# Patient Record
Sex: Female | Born: 1935 | Race: Black or African American | Hispanic: No | Smoking: Former smoker
Health system: Southern US, Community
[De-identification: ages and names within clinical notes are randomized; demographics above are authoritative.]

## PROBLEM LIST (undated history)

## (undated) DIAGNOSIS — A419 Sepsis, unspecified organism: Secondary | ICD-10-CM

## (undated) DIAGNOSIS — R609 Edema, unspecified: Secondary | ICD-10-CM

## (undated) DIAGNOSIS — Z634 Disappearance and death of family member: Secondary | ICD-10-CM

## (undated) DIAGNOSIS — I10 Essential (primary) hypertension: Secondary | ICD-10-CM

## (undated) DIAGNOSIS — N181 Chronic kidney disease, stage 1: Secondary | ICD-10-CM

## (undated) DIAGNOSIS — R1310 Dysphagia, unspecified: Secondary | ICD-10-CM

## (undated) DIAGNOSIS — B0052 Herpesviral keratitis: Secondary | ICD-10-CM

## (undated) DIAGNOSIS — K5730 Diverticulosis of large intestine without perforation or abscess without bleeding: Secondary | ICD-10-CM

## (undated) DIAGNOSIS — F0390 Unspecified dementia without behavioral disturbance: Secondary | ICD-10-CM

## (undated) DIAGNOSIS — E875 Hyperkalemia: Secondary | ICD-10-CM

## (undated) DIAGNOSIS — M810 Age-related osteoporosis without current pathological fracture: Secondary | ICD-10-CM

## (undated) DIAGNOSIS — K5900 Constipation, unspecified: Secondary | ICD-10-CM

## (undated) DIAGNOSIS — E876 Hypokalemia: Secondary | ICD-10-CM

## (undated) DIAGNOSIS — R739 Hyperglycemia, unspecified: Secondary | ICD-10-CM

## (undated) DIAGNOSIS — K219 Gastro-esophageal reflux disease without esophagitis: Secondary | ICD-10-CM

## (undated) DIAGNOSIS — K769 Liver disease, unspecified: Secondary | ICD-10-CM

## (undated) DIAGNOSIS — M25519 Pain in unspecified shoulder: Secondary | ICD-10-CM

## (undated) DIAGNOSIS — D649 Anemia, unspecified: Secondary | ICD-10-CM

## (undated) DIAGNOSIS — I4891 Unspecified atrial fibrillation: Secondary | ICD-10-CM

## (undated) DIAGNOSIS — M1990 Unspecified osteoarthritis, unspecified site: Secondary | ICD-10-CM

## (undated) DIAGNOSIS — E785 Hyperlipidemia, unspecified: Secondary | ICD-10-CM

## (undated) DIAGNOSIS — D509 Iron deficiency anemia, unspecified: Secondary | ICD-10-CM

## (undated) DIAGNOSIS — M75120 Complete rotator cuff tear or rupture of unspecified shoulder, not specified as traumatic: Secondary | ICD-10-CM

## (undated) HISTORY — DX: Diverticulosis of large intestine without perforation or abscess without bleeding: K57.30

## (undated) HISTORY — DX: Age-related osteoporosis without current pathological fracture: M81.0

## (undated) HISTORY — DX: Essential (primary) hypertension: I10

## (undated) HISTORY — DX: Gastro-esophageal reflux disease without esophagitis: K21.9

## (undated) HISTORY — DX: Unspecified osteoarthritis, unspecified site: M19.90

## (undated) HISTORY — DX: Disappearance and death of family member: Z63.4

## (undated) HISTORY — DX: Pain in unspecified shoulder: M25.519

## (undated) HISTORY — DX: Edema, unspecified: R60.9

## (undated) HISTORY — PX: ABDOMINAL HYSTERECTOMY: SHX81

## (undated) HISTORY — PX: EYE SURGERY: SHX253

## (undated) HISTORY — DX: Iron deficiency anemia, unspecified: D50.9

## (undated) HISTORY — DX: Constipation, unspecified: K59.00

## (undated) HISTORY — DX: Chronic kidney disease, stage 1: N18.1

## (undated) HISTORY — DX: Hyperlipidemia, unspecified: E78.5

## (undated) HISTORY — PX: OTHER SURGICAL HISTORY: SHX169

## (undated) HISTORY — DX: Complete rotator cuff tear or rupture of unspecified shoulder, not specified as traumatic: M75.120

## (undated) HISTORY — DX: Herpesviral keratitis: B00.52

## (undated) HISTORY — DX: Hyperglycemia, unspecified: R73.9

---

## 2001-03-21 ENCOUNTER — Ambulatory Visit (HOSPITAL_COMMUNITY): Admission: RE | Admit: 2001-03-21 | Discharge: 2001-03-21 | Payer: Self-pay | Admitting: General Surgery

## 2001-03-21 ENCOUNTER — Encounter: Payer: Self-pay | Admitting: General Surgery

## 2001-03-24 ENCOUNTER — Ambulatory Visit (HOSPITAL_COMMUNITY): Admission: RE | Admit: 2001-03-24 | Discharge: 2001-03-24 | Payer: Self-pay | Admitting: General Surgery

## 2001-03-24 ENCOUNTER — Encounter: Payer: Self-pay | Admitting: General Surgery

## 2001-04-01 ENCOUNTER — Ambulatory Visit (HOSPITAL_COMMUNITY): Admission: RE | Admit: 2001-04-01 | Discharge: 2001-04-01 | Payer: Self-pay | Admitting: General Surgery

## 2001-04-19 ENCOUNTER — Ambulatory Visit (HOSPITAL_COMMUNITY): Admission: RE | Admit: 2001-04-19 | Discharge: 2001-04-20 | Payer: Self-pay | Admitting: Family Medicine

## 2001-04-20 ENCOUNTER — Encounter: Payer: Self-pay | Admitting: Family Medicine

## 2001-05-11 ENCOUNTER — Ambulatory Visit (HOSPITAL_COMMUNITY): Admission: RE | Admit: 2001-05-11 | Discharge: 2001-05-11 | Payer: Self-pay | Admitting: General Surgery

## 2001-08-03 ENCOUNTER — Ambulatory Visit (HOSPITAL_COMMUNITY): Admission: RE | Admit: 2001-08-03 | Discharge: 2001-08-03 | Payer: Self-pay | Admitting: General Surgery

## 2001-08-03 ENCOUNTER — Encounter: Payer: Self-pay | Admitting: General Surgery

## 2002-08-09 ENCOUNTER — Ambulatory Visit (HOSPITAL_COMMUNITY): Admission: RE | Admit: 2002-08-09 | Discharge: 2002-08-09 | Payer: Self-pay | Admitting: General Surgery

## 2002-08-09 ENCOUNTER — Encounter: Payer: Self-pay | Admitting: General Surgery

## 2003-08-13 ENCOUNTER — Ambulatory Visit (HOSPITAL_COMMUNITY): Admission: RE | Admit: 2003-08-13 | Discharge: 2003-08-13 | Payer: Self-pay | Admitting: Family Medicine

## 2004-05-07 ENCOUNTER — Ambulatory Visit (HOSPITAL_COMMUNITY): Admission: RE | Admit: 2004-05-07 | Discharge: 2004-05-07 | Payer: Self-pay | Admitting: Family Medicine

## 2004-05-26 ENCOUNTER — Ambulatory Visit (HOSPITAL_COMMUNITY): Admission: RE | Admit: 2004-05-26 | Discharge: 2004-05-26 | Payer: Self-pay | Admitting: Family Medicine

## 2004-05-29 ENCOUNTER — Ambulatory Visit: Payer: Self-pay | Admitting: *Deleted

## 2004-09-15 ENCOUNTER — Ambulatory Visit (HOSPITAL_COMMUNITY): Admission: RE | Admit: 2004-09-15 | Discharge: 2004-09-15 | Payer: Self-pay | Admitting: General Surgery

## 2004-11-27 ENCOUNTER — Ambulatory Visit (HOSPITAL_COMMUNITY): Admission: RE | Admit: 2004-11-27 | Discharge: 2004-11-27 | Payer: Self-pay | Admitting: General Surgery

## 2005-09-16 ENCOUNTER — Ambulatory Visit (HOSPITAL_COMMUNITY): Admission: RE | Admit: 2005-09-16 | Discharge: 2005-09-16 | Payer: Self-pay | Admitting: General Surgery

## 2006-04-16 ENCOUNTER — Encounter: Admission: RE | Admit: 2006-04-16 | Discharge: 2006-04-16 | Payer: Self-pay | Admitting: Orthopedic Surgery

## 2006-04-16 ENCOUNTER — Encounter (INDEPENDENT_AMBULATORY_CARE_PROVIDER_SITE_OTHER): Payer: Self-pay | Admitting: Family Medicine

## 2006-05-31 ENCOUNTER — Ambulatory Visit: Payer: Self-pay | Admitting: Family Medicine

## 2006-06-02 ENCOUNTER — Encounter (INDEPENDENT_AMBULATORY_CARE_PROVIDER_SITE_OTHER): Payer: Self-pay | Admitting: Family Medicine

## 2006-06-04 ENCOUNTER — Ambulatory Visit (HOSPITAL_COMMUNITY): Admission: RE | Admit: 2006-06-04 | Discharge: 2006-06-04 | Payer: Self-pay | Admitting: Family Medicine

## 2006-06-04 ENCOUNTER — Encounter (INDEPENDENT_AMBULATORY_CARE_PROVIDER_SITE_OTHER): Payer: Self-pay | Admitting: Family Medicine

## 2006-06-14 ENCOUNTER — Ambulatory Visit: Payer: Self-pay | Admitting: Family Medicine

## 2006-06-15 ENCOUNTER — Encounter (INDEPENDENT_AMBULATORY_CARE_PROVIDER_SITE_OTHER): Payer: Self-pay | Admitting: Family Medicine

## 2006-06-17 ENCOUNTER — Encounter (INDEPENDENT_AMBULATORY_CARE_PROVIDER_SITE_OTHER): Payer: Self-pay | Admitting: Family Medicine

## 2006-07-15 ENCOUNTER — Ambulatory Visit: Payer: Self-pay | Admitting: Family Medicine

## 2006-07-19 ENCOUNTER — Encounter: Payer: Self-pay | Admitting: Family Medicine

## 2006-07-19 DIAGNOSIS — M1990 Unspecified osteoarthritis, unspecified site: Secondary | ICD-10-CM | POA: Insufficient documentation

## 2006-07-19 DIAGNOSIS — K5900 Constipation, unspecified: Secondary | ICD-10-CM | POA: Insufficient documentation

## 2006-07-19 DIAGNOSIS — M810 Age-related osteoporosis without current pathological fracture: Secondary | ICD-10-CM | POA: Insufficient documentation

## 2006-07-19 DIAGNOSIS — K5730 Diverticulosis of large intestine without perforation or abscess without bleeding: Secondary | ICD-10-CM | POA: Insufficient documentation

## 2006-07-19 DIAGNOSIS — E785 Hyperlipidemia, unspecified: Secondary | ICD-10-CM | POA: Insufficient documentation

## 2006-07-24 ENCOUNTER — Encounter (INDEPENDENT_AMBULATORY_CARE_PROVIDER_SITE_OTHER): Payer: Self-pay | Admitting: Family Medicine

## 2006-08-26 ENCOUNTER — Ambulatory Visit: Payer: Self-pay | Admitting: Family Medicine

## 2006-08-26 LAB — CONVERTED CEMR LAB
Cholesterol, target level: 200 mg/dL
HDL goal, serum: 40 mg/dL

## 2006-08-28 LAB — CONVERTED CEMR LAB
ALT: 8 units/L (ref 0–35)
AST: 10 units/L (ref 0–37)
Albumin: 4.1 g/dL (ref 3.5–5.2)
Alkaline Phosphatase: 64 units/L (ref 39–117)
Bilirubin, Direct: 0.1 mg/dL (ref 0.0–0.3)
Indirect Bilirubin: 0.3 mg/dL (ref 0.0–0.9)
Total Bilirubin: 0.4 mg/dL (ref 0.3–1.2)
Total Protein: 6.7 g/dL (ref 6.0–8.3)

## 2006-09-20 ENCOUNTER — Ambulatory Visit (HOSPITAL_COMMUNITY): Admission: RE | Admit: 2006-09-20 | Discharge: 2006-09-20 | Payer: Self-pay | Admitting: Family Medicine

## 2006-10-07 ENCOUNTER — Ambulatory Visit: Payer: Self-pay | Admitting: Family Medicine

## 2006-10-11 ENCOUNTER — Encounter (INDEPENDENT_AMBULATORY_CARE_PROVIDER_SITE_OTHER): Payer: Self-pay | Admitting: Family Medicine

## 2006-10-20 ENCOUNTER — Ambulatory Visit: Payer: Self-pay | Admitting: Orthopedic Surgery

## 2006-10-21 LAB — CONVERTED CEMR LAB
ALT: <8 units/L (ref 0–35)
AST: 11 units/L (ref 0–37)
Albumin: 4.3 g/dL (ref 3.5–5.2)
BUN: 23 mg/dL (ref 6–23)
CO2: 24 meq/L (ref 19–32)
Calcium: 9.5 mg/dL (ref 8.4–10.5)
Chloride: 107 meq/L (ref 96–112)
Sodium: 143 meq/L (ref 135–145)
Total CHOL/HDL Ratio: 3.9

## 2006-11-09 ENCOUNTER — Telehealth (INDEPENDENT_AMBULATORY_CARE_PROVIDER_SITE_OTHER): Payer: Self-pay | Admitting: *Deleted

## 2006-11-23 ENCOUNTER — Encounter (INDEPENDENT_AMBULATORY_CARE_PROVIDER_SITE_OTHER): Payer: Self-pay | Admitting: Family Medicine

## 2006-12-02 ENCOUNTER — Telehealth (INDEPENDENT_AMBULATORY_CARE_PROVIDER_SITE_OTHER): Payer: Self-pay | Admitting: *Deleted

## 2006-12-06 ENCOUNTER — Ambulatory Visit: Payer: Self-pay | Admitting: Family Medicine

## 2006-12-31 ENCOUNTER — Ambulatory Visit: Payer: Self-pay | Admitting: Family Medicine

## 2007-01-01 ENCOUNTER — Encounter (INDEPENDENT_AMBULATORY_CARE_PROVIDER_SITE_OTHER): Payer: Self-pay | Admitting: Family Medicine

## 2007-01-02 LAB — CONVERTED CEMR LAB
ALT: <8 units/L (ref 0–35)
AST: 12 units/L (ref 0–37)
Albumin: 4.1 g/dL (ref 3.5–5.2)
Alkaline Phosphatase: 59 units/L (ref 39–117)
Basophils Absolute: 0 10*3/uL (ref 0.0–0.1)
Chloride: 108 meq/L (ref 96–112)
Eosinophils Absolute: 0.1 10*3/uL (ref 0.0–0.7)
Eosinophils Relative: 1 % (ref 0–5)
Glucose, Bld: 89 mg/dL (ref 70–99)
Hemoglobin: 10.2 g/dL — ABNORMAL LOW (ref 12.0–15.0)
Lymphs Abs: 0.8 10*3/uL (ref 0.7–3.3)
MCHC: 30.4 g/dL (ref 30.0–36.0)
Monocytes Absolute: 0.2 10*3/uL (ref 0.2–0.7)
Neutro Abs: 4.7 10*3/uL (ref 1.7–7.7)
RDW: 15.2 % — ABNORMAL HIGH (ref 11.5–14.0)
Total Protein: 6.8 g/dL (ref 6.0–8.3)

## 2007-01-03 ENCOUNTER — Encounter (INDEPENDENT_AMBULATORY_CARE_PROVIDER_SITE_OTHER): Payer: Self-pay | Admitting: Family Medicine

## 2007-01-03 ENCOUNTER — Telehealth (INDEPENDENT_AMBULATORY_CARE_PROVIDER_SITE_OTHER): Payer: Self-pay | Admitting: *Deleted

## 2007-01-04 ENCOUNTER — Encounter (INDEPENDENT_AMBULATORY_CARE_PROVIDER_SITE_OTHER): Payer: Self-pay | Admitting: Family Medicine

## 2007-01-11 ENCOUNTER — Encounter (INDEPENDENT_AMBULATORY_CARE_PROVIDER_SITE_OTHER): Payer: Self-pay | Admitting: Family Medicine

## 2007-01-13 ENCOUNTER — Telehealth (INDEPENDENT_AMBULATORY_CARE_PROVIDER_SITE_OTHER): Payer: Self-pay | Admitting: *Deleted

## 2007-01-13 LAB — CONVERTED CEMR LAB
TIBC: 254 ug/dL (ref 250–470)
Vitamin B-12: 501 pg/mL (ref 211–911)

## 2007-01-20 ENCOUNTER — Ambulatory Visit: Payer: Self-pay | Admitting: Family Medicine

## 2007-01-20 ENCOUNTER — Telehealth (INDEPENDENT_AMBULATORY_CARE_PROVIDER_SITE_OTHER): Payer: Self-pay | Admitting: *Deleted

## 2007-01-20 DIAGNOSIS — N182 Chronic kidney disease, stage 2 (mild): Secondary | ICD-10-CM | POA: Insufficient documentation

## 2007-02-01 ENCOUNTER — Ambulatory Visit: Payer: Self-pay | Admitting: Internal Medicine

## 2007-02-07 ENCOUNTER — Ambulatory Visit: Payer: Self-pay | Admitting: Internal Medicine

## 2007-03-02 ENCOUNTER — Ambulatory Visit: Payer: Self-pay | Admitting: Family Medicine

## 2007-03-03 ENCOUNTER — Encounter (INDEPENDENT_AMBULATORY_CARE_PROVIDER_SITE_OTHER): Payer: Self-pay | Admitting: Family Medicine

## 2007-04-01 ENCOUNTER — Ambulatory Visit: Payer: Self-pay | Admitting: Internal Medicine

## 2007-04-08 ENCOUNTER — Ambulatory Visit: Payer: Self-pay | Admitting: Family Medicine

## 2007-04-13 ENCOUNTER — Ambulatory Visit (HOSPITAL_COMMUNITY): Admission: RE | Admit: 2007-04-13 | Discharge: 2007-04-13 | Payer: Self-pay | Admitting: Internal Medicine

## 2007-04-13 ENCOUNTER — Ambulatory Visit: Payer: Self-pay | Admitting: Internal Medicine

## 2007-04-13 LAB — HM COLONOSCOPY

## 2007-04-29 ENCOUNTER — Encounter (INDEPENDENT_AMBULATORY_CARE_PROVIDER_SITE_OTHER): Payer: Self-pay | Admitting: Family Medicine

## 2007-07-20 ENCOUNTER — Encounter (INDEPENDENT_AMBULATORY_CARE_PROVIDER_SITE_OTHER): Payer: Self-pay | Admitting: Family Medicine

## 2007-07-29 ENCOUNTER — Emergency Department (HOSPITAL_COMMUNITY): Admission: EM | Admit: 2007-07-29 | Discharge: 2007-07-29 | Payer: Self-pay | Admitting: Emergency Medicine

## 2007-08-22 ENCOUNTER — Ambulatory Visit: Payer: Self-pay | Admitting: Family Medicine

## 2007-08-22 ENCOUNTER — Telehealth (INDEPENDENT_AMBULATORY_CARE_PROVIDER_SITE_OTHER): Payer: Self-pay | Admitting: *Deleted

## 2007-08-24 ENCOUNTER — Encounter (INDEPENDENT_AMBULATORY_CARE_PROVIDER_SITE_OTHER): Payer: Self-pay | Admitting: Family Medicine

## 2007-08-31 ENCOUNTER — Encounter (INDEPENDENT_AMBULATORY_CARE_PROVIDER_SITE_OTHER): Payer: Self-pay | Admitting: Family Medicine

## 2007-09-05 ENCOUNTER — Telehealth (INDEPENDENT_AMBULATORY_CARE_PROVIDER_SITE_OTHER): Payer: Self-pay | Admitting: *Deleted

## 2007-09-06 ENCOUNTER — Encounter (INDEPENDENT_AMBULATORY_CARE_PROVIDER_SITE_OTHER): Payer: Self-pay | Admitting: Family Medicine

## 2007-09-12 ENCOUNTER — Ambulatory Visit: Payer: Self-pay | Admitting: Orthopedic Surgery

## 2007-09-12 DIAGNOSIS — M75120 Complete rotator cuff tear or rupture of unspecified shoulder, not specified as traumatic: Secondary | ICD-10-CM

## 2007-09-19 ENCOUNTER — Ambulatory Visit: Payer: Self-pay | Admitting: Family Medicine

## 2007-09-20 ENCOUNTER — Encounter (INDEPENDENT_AMBULATORY_CARE_PROVIDER_SITE_OTHER): Payer: Self-pay | Admitting: Family Medicine

## 2007-09-21 ENCOUNTER — Telehealth (INDEPENDENT_AMBULATORY_CARE_PROVIDER_SITE_OTHER): Payer: Self-pay | Admitting: *Deleted

## 2007-09-21 ENCOUNTER — Encounter (INDEPENDENT_AMBULATORY_CARE_PROVIDER_SITE_OTHER): Payer: Self-pay | Admitting: Family Medicine

## 2007-09-21 LAB — CONVERTED CEMR LAB: Iron: 65 ug/dL (ref 42–145)

## 2007-09-22 ENCOUNTER — Ambulatory Visit (HOSPITAL_COMMUNITY): Admission: RE | Admit: 2007-09-22 | Discharge: 2007-09-22 | Payer: Self-pay | Admitting: Family Medicine

## 2007-09-26 ENCOUNTER — Telehealth (INDEPENDENT_AMBULATORY_CARE_PROVIDER_SITE_OTHER): Payer: Self-pay | Admitting: *Deleted

## 2007-09-27 ENCOUNTER — Encounter (INDEPENDENT_AMBULATORY_CARE_PROVIDER_SITE_OTHER): Payer: Self-pay | Admitting: Family Medicine

## 2007-10-03 ENCOUNTER — Encounter (INDEPENDENT_AMBULATORY_CARE_PROVIDER_SITE_OTHER): Payer: Self-pay | Admitting: Family Medicine

## 2007-11-30 ENCOUNTER — Ambulatory Visit: Payer: Self-pay | Admitting: Family Medicine

## 2007-12-01 LAB — CONVERTED CEMR LAB
Basophils Absolute: 0 10*3/uL (ref 0.0–0.1)
Basophils Relative: 0 % (ref 0–1)
Eosinophils Absolute: 0.1 10*3/uL (ref 0.0–0.7)
Eosinophils Relative: 2 % (ref 0–5)
Lymphocytes Relative: 15 % (ref 12–46)
Lymphs Abs: 0.7 10*3/uL (ref 0.7–4.0)
Monocytes Absolute: 0.4 10*3/uL (ref 0.1–1.0)
Neutrophils Relative %: 75 % (ref 43–77)
Platelets: 194 10*3/uL (ref 150–400)
RBC: 3.94 M/uL (ref 3.87–5.11)
RDW: 14.7 % (ref 11.5–15.5)

## 2007-12-13 ENCOUNTER — Encounter (INDEPENDENT_AMBULATORY_CARE_PROVIDER_SITE_OTHER): Payer: Self-pay | Admitting: Family Medicine

## 2007-12-13 ENCOUNTER — Ambulatory Visit: Payer: Self-pay | Admitting: Orthopedic Surgery

## 2007-12-13 LAB — CONVERTED CEMR LAB
Bilirubin Urine: NEGATIVE
Glucose, Urine, Semiquant: NEGATIVE
Ketones, urine, test strip: NEGATIVE
Protein, U semiquant: NEGATIVE
Urobilinogen, UA: 0.2
WBC Urine, dipstick: NEGATIVE

## 2007-12-30 ENCOUNTER — Ambulatory Visit: Payer: Self-pay | Admitting: Family Medicine

## 2008-01-18 LAB — CONVERTED CEMR LAB
BUN: 30 mg/dL — ABNORMAL HIGH (ref 6–23)
Glucose, Bld: 84 mg/dL (ref 70–99)
Rhuematoid fact SerPl-aCnc: 43 intl units/mL — ABNORMAL HIGH (ref 0–20)

## 2008-01-31 ENCOUNTER — Ambulatory Visit: Payer: Self-pay | Admitting: Family Medicine

## 2008-03-14 ENCOUNTER — Ambulatory Visit: Payer: Self-pay | Admitting: Orthopedic Surgery

## 2008-05-01 ENCOUNTER — Ambulatory Visit: Payer: Self-pay | Admitting: Family Medicine

## 2008-05-03 ENCOUNTER — Encounter (INDEPENDENT_AMBULATORY_CARE_PROVIDER_SITE_OTHER): Payer: Self-pay | Admitting: Family Medicine

## 2008-05-07 LAB — CONVERTED CEMR LAB
ALT: <8 units/L (ref 0–35)
Albumin: 3.9 g/dL (ref 3.5–5.2)
Alkaline Phosphatase: 71 units/L (ref 39–117)
Calcium: 9 mg/dL (ref 8.4–10.5)
Glucose, Bld: 94 mg/dL (ref 70–99)
Sodium: 137 meq/L (ref 135–145)
Total Bilirubin: 0.4 mg/dL (ref 0.3–1.2)

## 2008-05-29 ENCOUNTER — Telehealth (INDEPENDENT_AMBULATORY_CARE_PROVIDER_SITE_OTHER): Payer: Self-pay | Admitting: Family Medicine

## 2008-06-12 ENCOUNTER — Encounter (INDEPENDENT_AMBULATORY_CARE_PROVIDER_SITE_OTHER): Payer: Self-pay | Admitting: Family Medicine

## 2008-06-13 ENCOUNTER — Encounter (INDEPENDENT_AMBULATORY_CARE_PROVIDER_SITE_OTHER): Payer: Self-pay | Admitting: Family Medicine

## 2008-06-25 ENCOUNTER — Ambulatory Visit: Payer: Self-pay | Admitting: Family Medicine

## 2008-07-19 ENCOUNTER — Telehealth (INDEPENDENT_AMBULATORY_CARE_PROVIDER_SITE_OTHER): Payer: Self-pay | Admitting: Family Medicine

## 2008-08-24 ENCOUNTER — Ambulatory Visit: Payer: Self-pay | Admitting: Family Medicine

## 2008-08-24 DIAGNOSIS — M25559 Pain in unspecified hip: Secondary | ICD-10-CM | POA: Insufficient documentation

## 2008-08-31 ENCOUNTER — Ambulatory Visit (HOSPITAL_COMMUNITY): Admission: RE | Admit: 2008-08-31 | Discharge: 2008-08-31 | Payer: Self-pay | Admitting: Family Medicine

## 2008-08-31 ENCOUNTER — Encounter (INDEPENDENT_AMBULATORY_CARE_PROVIDER_SITE_OTHER): Payer: Self-pay | Admitting: Family Medicine

## 2008-08-31 ENCOUNTER — Encounter: Payer: Self-pay | Admitting: Orthopedic Surgery

## 2008-09-04 LAB — CONVERTED CEMR LAB
AST: 11 units/L (ref 0–37)
Basophils Relative: 0 % (ref 0–1)
Chloride: 104 meq/L (ref 96–112)
Cholesterol: 166 mg/dL (ref 0–200)
Glucose, Bld: 85 mg/dL (ref 70–99)
HCT: 33.6 % — ABNORMAL LOW (ref 36.0–46.0)
HDL: 55 mg/dL (ref >39)
Hemoglobin: 9.9 g/dL — ABNORMAL LOW (ref 12.0–15.0)
MCHC: 29.5 g/dL — ABNORMAL LOW (ref 30.0–36.0)
MCV: 82.6 fL (ref 78.0–100.0)
Monocytes Absolute: 0.4 10*3/uL (ref 0.1–1.0)
Monocytes Relative: 7 % (ref 3–12)
Platelets: 161 10*3/uL (ref 150–400)
Potassium: 4.3 meq/L (ref 3.5–5.3)
RBC: 4.07 M/uL (ref 3.87–5.11)
RDW: 17.4 % — ABNORMAL HIGH (ref 11.5–15.5)
Sodium: 139 meq/L (ref 135–145)
Total Bilirubin: 0.4 mg/dL (ref 0.3–1.2)
Total CHOL/HDL Ratio: 3
Triglycerides: 117 mg/dL (ref <150)
VLDL: 23 mg/dL (ref 0–40)
WBC: 5.4 10*3/uL (ref 4.0–10.5)

## 2008-09-07 ENCOUNTER — Ambulatory Visit: Payer: Self-pay | Admitting: Family Medicine

## 2008-09-11 LAB — CONVERTED CEMR LAB
Iron: 30 ug/dL — ABNORMAL LOW (ref 42–145)
Retic Ct Pct: 0.5 % (ref 0.4–3.1)
Saturation Ratios: 11 % — ABNORMAL LOW (ref 20–55)

## 2008-09-18 ENCOUNTER — Ambulatory Visit: Payer: Self-pay | Admitting: Family Medicine

## 2008-09-18 DIAGNOSIS — K921 Melena: Secondary | ICD-10-CM | POA: Insufficient documentation

## 2008-09-18 LAB — CONVERTED CEMR LAB: OCCULT 1: POSITIVE

## 2008-09-19 ENCOUNTER — Encounter (INDEPENDENT_AMBULATORY_CARE_PROVIDER_SITE_OTHER): Payer: Self-pay | Admitting: *Deleted

## 2008-09-19 ENCOUNTER — Encounter (INDEPENDENT_AMBULATORY_CARE_PROVIDER_SITE_OTHER): Payer: Self-pay | Admitting: Family Medicine

## 2008-09-24 ENCOUNTER — Ambulatory Visit (HOSPITAL_COMMUNITY): Admission: RE | Admit: 2008-09-24 | Discharge: 2008-09-24 | Payer: Self-pay | Admitting: Family Medicine

## 2008-09-25 ENCOUNTER — Ambulatory Visit: Payer: Self-pay | Admitting: Orthopedic Surgery

## 2008-10-22 ENCOUNTER — Ambulatory Visit: Payer: Self-pay | Admitting: Gastroenterology

## 2008-10-22 ENCOUNTER — Encounter: Payer: Self-pay | Admitting: Internal Medicine

## 2008-11-01 ENCOUNTER — Encounter: Payer: Self-pay | Admitting: Internal Medicine

## 2008-11-01 ENCOUNTER — Ambulatory Visit (HOSPITAL_COMMUNITY): Admission: RE | Admit: 2008-11-01 | Discharge: 2008-11-01 | Payer: Self-pay | Admitting: Internal Medicine

## 2008-11-01 ENCOUNTER — Ambulatory Visit: Payer: Self-pay | Admitting: Internal Medicine

## 2008-11-13 ENCOUNTER — Ambulatory Visit: Payer: Self-pay | Admitting: Internal Medicine

## 2008-11-13 ENCOUNTER — Ambulatory Visit (HOSPITAL_COMMUNITY): Admission: RE | Admit: 2008-11-13 | Discharge: 2008-11-13 | Payer: Self-pay | Admitting: Internal Medicine

## 2008-11-16 ENCOUNTER — Encounter: Payer: Self-pay | Admitting: Internal Medicine

## 2008-11-16 ENCOUNTER — Encounter (INDEPENDENT_AMBULATORY_CARE_PROVIDER_SITE_OTHER): Payer: Self-pay | Admitting: *Deleted

## 2008-11-30 ENCOUNTER — Ambulatory Visit (HOSPITAL_COMMUNITY): Admission: RE | Admit: 2008-11-30 | Discharge: 2008-11-30 | Payer: Self-pay | Admitting: Internal Medicine

## 2008-12-03 ENCOUNTER — Encounter: Payer: Self-pay | Admitting: Internal Medicine

## 2008-12-05 ENCOUNTER — Encounter: Payer: Self-pay | Admitting: Internal Medicine

## 2008-12-10 ENCOUNTER — Ambulatory Visit: Payer: Self-pay | Admitting: Family Medicine

## 2008-12-12 ENCOUNTER — Encounter (INDEPENDENT_AMBULATORY_CARE_PROVIDER_SITE_OTHER): Payer: Self-pay | Admitting: Family Medicine

## 2008-12-12 ENCOUNTER — Ambulatory Visit (HOSPITAL_COMMUNITY): Admission: RE | Admit: 2008-12-12 | Discharge: 2008-12-12 | Payer: Self-pay | Admitting: Family Medicine

## 2008-12-12 ENCOUNTER — Ambulatory Visit: Payer: Self-pay | Admitting: Cardiology

## 2008-12-18 ENCOUNTER — Encounter (INDEPENDENT_AMBULATORY_CARE_PROVIDER_SITE_OTHER): Payer: Self-pay | Admitting: *Deleted

## 2008-12-19 ENCOUNTER — Telehealth (INDEPENDENT_AMBULATORY_CARE_PROVIDER_SITE_OTHER): Payer: Self-pay | Admitting: *Deleted

## 2009-01-03 ENCOUNTER — Encounter (INDEPENDENT_AMBULATORY_CARE_PROVIDER_SITE_OTHER): Payer: Self-pay | Admitting: Family Medicine

## 2009-01-08 ENCOUNTER — Ambulatory Visit: Payer: Self-pay | Admitting: Family Medicine

## 2009-02-21 ENCOUNTER — Ambulatory Visit: Payer: Self-pay | Admitting: Family Medicine

## 2009-02-22 ENCOUNTER — Encounter (INDEPENDENT_AMBULATORY_CARE_PROVIDER_SITE_OTHER): Payer: Self-pay | Admitting: Family Medicine

## 2009-02-25 ENCOUNTER — Encounter: Payer: Self-pay | Admitting: Internal Medicine

## 2009-02-26 ENCOUNTER — Telehealth (INDEPENDENT_AMBULATORY_CARE_PROVIDER_SITE_OTHER): Payer: Self-pay | Admitting: *Deleted

## 2009-02-26 LAB — CONVERTED CEMR LAB
ALT: <8 units/L (ref 0–35)
AST: 13 units/L (ref 0–37)
Albumin: 3.7 g/dL (ref 3.5–5.2)
Alkaline Phosphatase: 61 units/L (ref 39–117)
Chloride: 105 meq/L (ref 96–112)
Creatinine, Ser: 0.86 mg/dL (ref 0.40–1.20)
Eosinophils Absolute: 0 10*3/uL (ref 0.0–0.7)
Glucose, Bld: 104 mg/dL — ABNORMAL HIGH (ref 70–99)
HCT: 30.2 % — ABNORMAL LOW (ref 36.0–46.0)
Hemoglobin: 9.2 g/dL — ABNORMAL LOW (ref 12.0–15.0)
Lymphocytes Relative: 12 % (ref 12–46)
MCHC: 30.5 g/dL (ref 30.0–36.0)
MCV: 83.7 fL (ref 78.0–100.0)
Neutrophils Relative %: 79 % — ABNORMAL HIGH (ref 43–77)
Platelets: 222 10*3/uL (ref 150–400)
RBC: 3.61 M/uL — ABNORMAL LOW (ref 3.87–5.11)
RDW: 15.4 % (ref 11.5–15.5)
Total Protein: 6.6 g/dL (ref 6.0–8.3)

## 2009-03-18 ENCOUNTER — Encounter: Payer: Self-pay | Admitting: Internal Medicine

## 2009-04-24 ENCOUNTER — Encounter: Payer: Self-pay | Admitting: Internal Medicine

## 2009-06-15 DIAGNOSIS — B0052 Herpesviral keratitis: Secondary | ICD-10-CM

## 2009-06-15 HISTORY — DX: Herpesviral keratitis: B00.52

## 2009-07-03 ENCOUNTER — Ambulatory Visit: Payer: Self-pay | Admitting: Orthopedic Surgery

## 2009-07-08 ENCOUNTER — Ambulatory Visit: Payer: Self-pay | Admitting: Orthopedic Surgery

## 2009-07-08 ENCOUNTER — Ambulatory Visit: Payer: Self-pay | Admitting: Cardiovascular Disease

## 2009-07-12 ENCOUNTER — Ambulatory Visit: Payer: Self-pay | Admitting: Orthopedic Surgery

## 2009-09-26 ENCOUNTER — Ambulatory Visit (HOSPITAL_COMMUNITY): Admission: RE | Admit: 2009-09-26 | Discharge: 2009-09-26 | Payer: Self-pay | Admitting: Family Medicine

## 2009-12-10 ENCOUNTER — Telehealth (INDEPENDENT_AMBULATORY_CARE_PROVIDER_SITE_OTHER): Payer: Self-pay

## 2009-12-13 ENCOUNTER — Encounter (INDEPENDENT_AMBULATORY_CARE_PROVIDER_SITE_OTHER): Payer: Self-pay

## 2009-12-20 ENCOUNTER — Encounter: Payer: Self-pay | Admitting: Gastroenterology

## 2010-01-16 ENCOUNTER — Encounter (INDEPENDENT_AMBULATORY_CARE_PROVIDER_SITE_OTHER): Payer: Self-pay | Admitting: *Deleted

## 2010-03-13 ENCOUNTER — Ambulatory Visit: Payer: Self-pay | Admitting: Internal Medicine

## 2010-03-13 DIAGNOSIS — D376 Neoplasm of uncertain behavior of liver, gallbladder and bile ducts: Secondary | ICD-10-CM

## 2010-03-21 ENCOUNTER — Encounter: Payer: Self-pay | Admitting: Gastroenterology

## 2010-03-26 LAB — CONVERTED CEMR LAB
ALT: <8 units/L (ref 0–35)
AST: 12 units/L (ref 0–37)
BUN: 20 mg/dL (ref 6–23)
Basophils Absolute: 0 10*3/uL (ref 0.0–0.1)
Bilirubin, Direct: 0.1 mg/dL (ref 0.0–0.3)
CO2: 26 meq/L (ref 19–32)
Chloride: 100 meq/L (ref 96–112)
Folate: >20 ng/mL
Glucose, Bld: 104 mg/dL — ABNORMAL HIGH (ref 70–99)
Hemoglobin: 9.4 g/dL — ABNORMAL LOW (ref 12.0–15.0)
Indirect Bilirubin: 0.2 mg/dL (ref 0.0–0.9)
Iron: 18 ug/dL — ABNORMAL LOW (ref 42–145)
Lymphocytes Relative: 18 % (ref 12–46)
MCHC: 29.7 g/dL — ABNORMAL LOW (ref 30.0–36.0)
Monocytes Absolute: 0.5 10*3/uL (ref 0.1–1.0)
Potassium: 4.4 meq/L (ref 3.5–5.3)
RBC: 4.01 M/uL (ref 3.87–5.11)
RDW: 16.1 % — ABNORMAL HIGH (ref 11.5–15.5)
Saturation Ratios: 7 % — ABNORMAL LOW (ref 20–55)
Total Bilirubin: 0.3 mg/dL (ref 0.3–1.2)
Total Protein: 7 g/dL (ref 6.0–8.3)
UIBC: 248 ug/dL
WBC: 5.3 10*3/uL (ref 4.0–10.5)

## 2010-04-07 ENCOUNTER — Ambulatory Visit: Payer: Self-pay | Admitting: Orthopedic Surgery

## 2010-04-07 DIAGNOSIS — M25519 Pain in unspecified shoulder: Secondary | ICD-10-CM

## 2010-04-10 ENCOUNTER — Encounter: Payer: Self-pay | Admitting: Internal Medicine

## 2010-04-16 ENCOUNTER — Ambulatory Visit (HOSPITAL_COMMUNITY): Admission: RE | Admit: 2010-04-16 | Discharge: 2010-04-16 | Payer: Self-pay | Admitting: Internal Medicine

## 2010-04-29 ENCOUNTER — Telehealth: Payer: Self-pay | Admitting: Orthopedic Surgery

## 2010-05-02 ENCOUNTER — Encounter: Payer: Self-pay | Admitting: Gastroenterology

## 2010-06-05 ENCOUNTER — Encounter: Payer: Self-pay | Admitting: Internal Medicine

## 2010-06-25 ENCOUNTER — Ambulatory Visit
Admission: RE | Admit: 2010-06-25 | Discharge: 2010-06-25 | Payer: Self-pay | Source: Home / Self Care | Attending: Orthopedic Surgery | Admitting: Orthopedic Surgery

## 2010-07-07 ENCOUNTER — Encounter: Payer: Self-pay | Admitting: Internal Medicine

## 2010-07-11 ENCOUNTER — Encounter (INDEPENDENT_AMBULATORY_CARE_PROVIDER_SITE_OTHER): Payer: Self-pay

## 2010-07-13 LAB — CONVERTED CEMR LAB
ALT: 11 units/L (ref 0–35)
Alkaline Phosphatase: 74 units/L (ref 39–117)
CO2: 19 meq/L (ref 19–32)
Calcium: 9.8 mg/dL (ref 8.4–10.5)
Chloride: 102 meq/L (ref 96–112)
Eosinophils Relative: 0 % (ref 0–5)
Glucose, Bld: 108 mg/dL — ABNORMAL HIGH (ref 70–99)
HDL: 46 mg/dL (ref >39)
Hemoglobin: 9.5 g/dL
Lymphocytes Relative: 9 % — ABNORMAL LOW (ref 12–46)
Lymphs Abs: 0.7 10*3/uL (ref 0.7–3.3)
Platelets: 248 10*3/uL (ref 150–400)
Potassium: 4.3 meq/L (ref 3.5–5.3)
RBC: 4.38 M/uL (ref 3.87–5.11)
Sodium: 138 meq/L (ref 135–145)
Total Bilirubin: 0.5 mg/dL (ref 0.3–1.2)
Total Protein: 7.6 g/dL (ref 6.0–8.3)
Triglycerides: 118 mg/dL (ref <150)
VLDL: 24 mg/dL (ref 0–40)
WBC: 8 10*3/uL (ref 4.0–10.5)

## 2010-07-15 NOTE — Letter (Signed)
Summary: REFERRAL FROM DR Isac Sarna  REFERRAL FROM DR [facility]   Imported By: Rexene Alberts 03/13/2010 14:54:39  _____________________________________________________________________  External Attachment:    Type:   Image     Comment:   External Document

## 2010-07-15 NOTE — Letter (Signed)
Summary: External Other /[facility] reports/notes  External Other /[facility] reports/notes   Imported By: Cloria Spring LPN 16/03/9603 54:09:81  _____________________________________________________________________  External Attachment:    Type:   Image     Comment:   External Document

## 2010-07-15 NOTE — Assessment & Plan Note (Signed)
Summary: BILAT SHOULDER PAIN RECURR'G/REQ'S INJECTIONS/PYRAMID TODAY'S...   Visit Type:  Follow-up Primary Gracemarie Skeet:  Isac Sarna, MD  CC:  SHOULDER PAIN.  History of Present Illness: 77 year old elderly female with bilateral rotator cuff disease consistent with rotator cuff tears previously injected back in 2009 presents for 2 injections  Also complains of back pain, usually respond well to prednisone Dosepak   Exam for elevation only to 95.  Painful for elevation up to 95.  bilateral subacromial injections were given in both shoulders.  Verbal consent obtained/The shoulder was injected with depomedrol 40mg /cc and sensorcaine .25% . There were no complications     Allergies: No Known Drug Allergies   Impression & Recommendations:  Problem # 1:  RUPTURE ROTATOR CUFF (ICD-727.61) Assessment Deteriorated  bilateral shoulder injection  Orders: Joint Aspirate / Injection, Large (20610) Depo- Medrol 40mg  (J1030)  Problem # 2:  SHOULDER PAIN (ICD-719.41) Assessment: Deteriorated  Her updated medication list for this problem includes:    Tramadol Hcl 50 Mg Tabs (Tramadol hcl) .Marland Kitchen..Marland Kitchen Two times a day prn  Orders: Joint Aspirate / Injection, Large (20610) Depo- Medrol 40mg  (J1030)  Medications Added to Medication List This Visit: 1)  Prednisone (pak) 5 Mg Tabs (Prednisone) .... As directed for 12 days  Patient Instructions: 1)  You have received an injection of cortisone today. You may experience increased pain at the injection site. Apply ice pack to the area for 20 minutes every 2 hours and take 2 xtra strength tylenol every 8 hours. This increased pain will usually resolve in 24 hours. The injection will take effect in 3-10 days.  2)  take steroid dosepack  3)  Please schedule a follow-up appointment as needed. Prescriptions: PREDNISONE (PAK) 5 MG TABS (PREDNISONE) as directed for 12 days  #1 x 0   Entered and Authorized by:   Fuller Canada MD   Signed by:    Fuller Canada MD on 04/07/2010   Method used:   Faxed to ...       [facility] (retail)       [address]       [place], [place]  [address]       Ph: [phone]       Fax: [phone]   RxID:   [id number]    Orders Added: 1)  Joint Aspirate / Injection, Large [20610] 2)  Depo- Medrol 40mg  [J1030]

## 2010-07-15 NOTE — Progress Notes (Signed)
Summary: steroid dose pak refilled  Phone Note Call from Patient Call back at Home Phone [phone]   Summary of Call: wants refill on Steroid dose pak, was given one in October, ok or not Initial call taken by: Ether Griffins,  April 29, 2010 10:07 AM  Follow-up for Phone Call        ok if for same problem  Follow-up by: Fuller Canada MD,  April 29, 2010 10:37 AM    Prescriptions: PREDNISONE (PAK) 5 MG TABS (PREDNISONE) as directed for 12 days  #1 x 0   Entered by:   Ether Griffins   Authorized by:   Fuller Canada MD   Signed by:   Ether Griffins on 04/29/2010   Method used:   Faxed to ...       [facility] (retail)       [address]       [place], [place]  [address]       Ph: [phone]       Fax: [phone]   RxID:   [id number]

## 2010-07-15 NOTE — Letter (Signed)
Summary: External Other/Labs/Dr. Isac Sarna  External Other/Labs/Dr. Isac Sarna   Imported By: Cloria Spring LPN 04/54/0981 19:14:78  _____________________________________________________________________  External Attachment:    Type:   Image     Comment:   External Document

## 2010-07-15 NOTE — Letter (Signed)
Summary: CT SCAN ORDER  CT SCAN ORDER   Imported By: Ave Filter 04/10/2010 15:46:59  _____________________________________________________________________  External Attachment:    Type:   Image     Comment:   External Document

## 2010-07-15 NOTE — Letter (Signed)
Summary: Appointment Reminder  [facility]  [address]   [place], [place] [address]   Phone: [phone]  Fax: [phone]       December 13, 2009   [place] [address] [place], [place]  [address] [birth year]    Dear Ms. [name],  We received a call from Dr. Scharlene Gloss office to schedule office visit for  worsening anemia. We have tried several times and could not reach   you by phone. It is important that you give [place] a call and get an   appointment scheduled. Please call and schedule when you receive  this letter.    Sincerely,    Cloria Spring LPN  [facility] R. Roetta Sessions, M.D.    Jonette Eva, M.D. Lorenza Burton, FNP-BC    Tana Coast, PA-C Phone: [phone]    Fax: [phone]

## 2010-07-15 NOTE — Miscellaneous (Signed)
Summary: Orders Update  Clinical Lists Changes  Orders: Added new Test order of T-Hepatic Function ([id number]) - Signed 

## 2010-07-15 NOTE — Letter (Signed)
Summary: Generic Letter, Intro to Referring  [facility]  [address]   [place], [place] [address]   Phone: [phone]  Fax: [phone]      January 16, 2010             RE: [name] [name]   11/16/1920                 [address]                 [place], [place]  [address]  Dear Kemper Durie,  This patient was a no show for her appointment            Sincerely,    Diana Eves  [facility] Ph: [phone]   Fax: [phone]

## 2010-07-15 NOTE — Progress Notes (Signed)
Summary: phone note from PCP/worsening anemia/ needs appt  Phone Note From Other Clinic   Caller: KIm @ Dr. Scharlene Gloss Summary of Call: Selena Batten from Dr. Scharlene Gloss  office called and said pt needs f/u appt for worsening anemia. Has Hemoglobin of 9.2. pt's phone number is [phone].  Selena Batten would like a call at [phone] when appt is made for. Initial call taken by: Cloria Spring LPN,  December 10, 2009 11:56 AM     Appended Document: phone note from PCP/worsening anemia/ needs appt PT NS x 2.  Please let Dr Margo Aye 's office know this when you make appt.  Did she ever FU w/ [facility] to complete work-up?  If so, please place records on chart.  Appended Document: phone note from PCP/worsening anemia/ needs appt Called pt, many rings.....no answer.   Appended Document: phone note from PCP/worsening anemia/ needs appt [facility] for Kim @ Dr. Scharlene Gloss that I have called pt, many rings...Marland KitchenMarland Kitcheninfomed her of the no shows and told her pt was supposed to follow-up at [facility].  Appended Document: phone note from PCP/worsening anemia/ needs appt Called both of the above numbers, multiple rings, and no answer. will mail letter to call.  Appended Document: phone note from PCP/worsening anemia/ needs appt Pt called, she said that she was seen @ [facility] in March.... It's hard to reach her by phone, she will call me back on Tues AM to see when she needs to be seen. Please advise, so we can try to schedule when she calls back.  Appended Document: phone note from PCP/worsening anemia/ needs appt Need records from [facility]. We have not seen patient in over one year. First available OV likely okay unless Dr. Scharlene Gloss office think she needs to be seen more urgently.  Appended Document: phone note from PCP/worsening anemia/ needs appt [facility], spoke with Rodell Perna, who will fax over the in they have on pt.  Appended Document: phone note from PCP/worsening anemia/ needs appt Called and left message for Kim @ Dr.Hall's office to call  and let me know how urgent a visit needed, and also send any recent labs, etc.  Appended Document: phone note from PCP/worsening anemia/ needs appt Verlon Au reviewed labs from Dr. Margo Aye and reports from [facility]  and said to make an appt in 2-3 weeks.  Appended Document: phone note from PCP/worsening anemia/ needs appt Pt aware of appt on 01/15/10 at 130 w/LSL

## 2010-07-15 NOTE — Assessment & Plan Note (Signed)
Summary: ANEMIA, WT LOSS/LAW   Visit Type:  Follow-up Visit Primary Care Provider:  Isac Sarna, MD  Chief Complaint:  anemia and wt. loss.  History of Present Illness: [name] [name] is an 77 year old African American pleasant female who presents today in follow-up. She has found it difficult to make it to several appointments, and she was actually referred by Dr. Margo Aye back in February 2011 for questionable liver and spleen lesion noted on a CT performed 07/12/09. She was actually referred to [facility] in June 2010 (from our office) due to a capsule endoscopy that showed a small bowel mass. She then underwent CT enterography 11/30/08, which was unable to show a small bowel mass; however, it did reveal a 5cm hemangioma of the liver. MRI enterography was then performed 03/20/09, showing no mass due to limited evaluation of small bowel secondary to lack of enteric contrast/bowel distention, as well as a 4.6X5X5.6 lesion on right lobe of liver consistent with a hemangioma. Per pt, she had no further work-up done with [facility], and she was told "there is nothing to do". Her weight has gone from 114 in February with Dr. Margo Aye to 104 today. She denies loss of appetite, denies nausea, no dysphagia or abdominal pain. c/o constipation, yet unsure how long between BM occurrences. Reports using magnesium citrate as needed with appropriate results. Denies any signs of blood in stool, reports stool is dark brown. Most recent labs done in June. Last colonoscopy 2008 which showed let sided diverticulosis and internal hemorrhoids.     Current Medications (verified): 1)  Amlodipine Besy-Benazepril Hcl 10-20 Mg Caps (Amlodipine Besy-Benazepril Hcl) .... Once Daily 2)  Prilosec 20 Mg  Cpdr (Omeprazole) .... One Daily 3)  Tramadol Hcl 50 Mg Tabs (Tramadol Hcl) .... Two Times A Day Prn  Allergies (verified): No Known Drug Allergies  Past History:  Past Medical History: Current Problems:  RUPTURE ROTATOR CUFF  (ICD-727.61) SHOULDER PAIN, BILATERAL (ICD-719.41) KIDNEY DISEASE, CHRONIC, STAGE I (ICD-585.1), Cre 0.92 in 3/10 ANEMIA, IRON DEFICIENCY (ICD-280.9) BEREAVEMENT, UNCOMPLICATED (ICD-V62.82) PERIPHERAL EDEMA (ICD-782.3) HYPERGLYCEMIA (ICD-790.6) CONSTIPATION NOS (ICD-564.00) OSTEOPOROSIS (ICD-733.00) OSTEOARTHRITIS (ICD-715.90) HYPERTENSION (ICD-401.9) HYPERLIPIDEMIA (ICD-272.4) GERD (ICD-530.81) DIVERTICULOSIS, COLON (ICD-562.10) TCS 10/08, Dr. Jena Gauss,  internal hemorrhoids, dense left-sided diverticula EGD 5/10, noncritical Schatki ring, antral erosions and healing ulcers, likely due to NSAIDS Givens capsule 6/10, distal SB mass, but not seen on f/u CTE or MR enterography  Review of Systems General:  Complains of weight loss; denies fever, chills, and fatigue; pt states she has slowly lost weight since husband's passing in 2008. . Eyes:  Denies blurring and vision loss. ENT:  Denies sore throat, hoarseness, and difficulty swallowing. CV:  Denies chest pains and dyspnea on exertion. Resp:  Denies dyspnea at rest and cough. GI:  Complains of constipation; denies difficulty swallowing, nausea, indigestion/heartburn, abdominal pain, and black BMs. MS:  Complains of joint stiffness; known arthritis. Neuro:  Denies weakness and syncope. Psych:  Denies depression, anxiety, and confusion.  Vital Signs:  Patient profile:   77 year old female Height:      61 inches Weight:      104 pounds BMI:     19.72 Temp:     97.9 degrees F oral Pulse rate:   64 / minute BP sitting:   132 / 88  (left arm) Cuff size:   regular  Vitals Entered By: Hendricks Limes LPN (March 13, 2010 10:37 AM)  Physical Exam  General:  Of staged age, pleasant no acute distress. Lungs:  Clear throughout to  auscultation. Heart:  Regular rate and rhythm; no murmurs, rubs,  or bruits. Abdomen:  normal bowel sounds, thin, without rebound, no distesion, no tenderness, and no hepatomegally or splenomegaly.     Neurologic:  Alert and  oriented x4;  grossly normal neurologically.  Impression & Recommendations:  Problem # 1:  ANEMIA, IRON DEFICIENCY (ICD-72.60)  77 year old female with long-standing history of anemia. Last H/H was 9.2/28.4. No evidence of active bleeding, pt asymptomatic.   Will recheck H/H today Consider colonoscopy as it has been 3 years  Orders: T-CBC w/Diff ([id number]) Est. Patient Level II (09811)  Problem # 2:  LIVER MASS (ICD-235.3)  Liver mass noted on CTChest performed 07/16/09. Recommendations from CT Chest were to evaluate further with CT of abd, as well as LFTs.   Will draw LFTs today as well as BMP (for Creatinine in case of CT) Possible CT of abd/pelvis after reviewing previous scans Also need to obtain any further records from [facility] regarding plan/treatment/assessment of small bowel mass.   Orders: T-Hepatic Function [id number]) Est. Patient Level II (13086)  Other Orders: T-Basic Metabolic Panel [phone])  Patient Instructions: 1)  H/H, BMP, LFTs today 2)  Review prior records, liver mass measurements from prior scans 3)  Possible CT scan in near future 4)  Possible colonoscopy dependent upon results received 5)  Close follow-up of patient I would like to thank Dr. Isac Sarna for allowing [place] to take part in the care of this nice lady.  Appended Document: ANEMIA, WT LOSS/LAW Has pt had labs done. Still need records from [facility]. Any records of procedures, xrays, ov notes since 6/10.   Appended Document: ANEMIA, WT LOSS/LAW Pt has not had labs done, but will try to do within the next few days.  Appended Document: ANEMIA, WT LOSS/LAW Have we received reports from [facility]? need for review so we may determine next step. thanks [employer]  Appended Document: ANEMIA, WT LOSS/LAW RECORDS REQUESTED  Appended Document: ANEMIA, WT LOSS/LAW Any records received yet? Can we request again? Thanks  Appended Document: ANEMIA, WT LOSS/LAW Discussed  with Tobi Bastos, awaiting records to determine next step. If no records this week, she will discuss with Dr. Jena Gauss and determine next step ie radiologic study etc.  Appended Document: ANEMIA, WT LOSS/LAW I HAVE REQUESTED TWICE AND THEY TOLD ME THEY HAD THEM IN THE MAIL THAT WAS OCTOBER 19TH  Appended Document: ANEMIA, WT LOSS/LAW Please set up for a CT of abd/pelvis to evaluate possible liver and spleen masses. Cr 1.06 on March 20, 2010. Thanks  Appended Document: ANEMIA, WT LOSS/LAW Pt scheduled for ct scan 04/16/10@11 :00am..Marland KitchenPt aware of appt.

## 2010-07-17 ENCOUNTER — Encounter (INDEPENDENT_AMBULATORY_CARE_PROVIDER_SITE_OTHER): Payer: Self-pay | Admitting: *Deleted

## 2010-07-17 NOTE — Letter (Signed)
Summary: PT REMINDER TO COMPLETE IFOBT  PT REMINDER TO COMPLETE IFOBT   Imported By: Rexene Alberts 06/05/2010 15:10:51  _____________________________________________________________________  External Attachment:    Type:   Image     Comment:   External Document

## 2010-07-17 NOTE — Letter (Signed)
Summary: Recall, Labs Needed  [facility]  [address]   [place], [place] [address]   Phone: [phone]  Fax: [phone]    July 11, 2010  [place] [address] [place], [place]  [address] 30-Dec-1920   Dear Ms. [name],   Our records indicate it is time to repeat your blood work.  You can take the enclosed form to the lab on or near the date indicated.  Please make note of the new location of the lab:   [address], 2nd floor   [employer] [facility]  Our office will call you within a week to ten business days with the results.  If you do not hear from [place] in 10 business days, you should call the office.  If you have any questions regarding this, call the office at [phone], and ask for the nurse.  Labs are due on 08/02/10.   Sincerely,    Hendricks Limes LPN  [facility] Ph: [phone]   Fax: [phone]

## 2010-07-17 NOTE — Assessment & Plan Note (Signed)
Summary: WANTS SHOULDER/HIP INJECTION/BSF   Visit Type:  Follow-up Primary Provider:  Isac Sarna, MD  CC:  shoulder pain.  History of Present Illness: 77 year old elderly female with bilateral rotator cuff disease and LEFT leg pain. Presents back for 2 injections in each one in the shoulder and one injection in the LEFT hip  Verbal consent was obtained. The left hip over the greater trochanter was prepped with alcohol and ethyl chloride. depomedrol 40mg /cc and sensorcaine .25% 1 cc each was injected. there were no coplications.  Verbal consent obtained/The left shoulder was injected with depomedrol 40mg /cc and sensorcaine .25% . There were no complications  Verbal consent obtained/The right shoulder was injected with depomedrol 40mg /cc and sensorcaine .25% . There were no complications      Allergies: No Known Drug Allergies   Impression & Recommendations:  Problem # 1:  RUPTURE ROTATOR CUFF (ICD-727.61) Assessment Deteriorated  Problem # 2:  HIP PAIN, LEFT (ICD-719.45) Assessment: Deteriorated  Her updated medication list for this problem includes:    Tramadol Hcl 50 Mg Tabs (Tramadol hcl) .Marland Kitchen..Marland Kitchen Two times a day prn  Orders: Est. Patient Level III ([address]) Joint Aspirate / Injection, Large (20610) Depo- Medrol 40mg  (J1030)  Medications Added to Medication List This Visit: 1)  Prednisone 5 Mg Tabs (Prednisone) .Marland Kitchen.. 1 by mouth once daily  Patient Instructions: 1)  You have received an injection of cortisone today. You may experience increased pain at the injection site. Apply ice pack to the area for 20 minutes every 2 hours and take 2 xtra strength tylenol every 8 hours. This increased pain will usually resolve in 24 hours. The injection will take effect in 3-10 days.   2)  3 mos as needed  Prescriptions: PREDNISONE 5 MG TABS (PREDNISONE) 1 by mouth once daily  #30 x 5   Entered and Authorized by:   Fuller Canada MD   Signed by:   Fuller Canada MD on  06/25/2010   Method used:   Print then Give to Patient   RxID:   [id number]    Orders Added: 1)  Est. Patient Level III [95621] 2)  Joint Aspirate / Injection, Large [20610] 3)  Depo- Medrol 40mg  [J1030]

## 2010-07-23 NOTE — Letter (Signed)
Summary: Recall Office Visit  [facility]  [address]   [place], [place] [address]   Phone: [phone]  Fax: [phone]      July 17, 2010   [name] [name] [address] [place], [place]  [address] 29-May-1921   Dear Ms. [name],   According to our records, it is time for you to schedule a follow-up office visit with [place].   At your convenience, please call [phone] to schedule an office visit. If you have any questions, concerns, or feel that this letter is in error, we would appreciate your call.   Sincerely,    Diana Eves  [facility] Ph: [phone]   Fax: [phone]

## 2010-08-26 ENCOUNTER — Encounter: Payer: Self-pay | Admitting: Orthopedic Surgery

## 2010-09-05 ENCOUNTER — Other Ambulatory Visit: Payer: Self-pay | Admitting: Orthopedic Surgery

## 2010-09-05 DIAGNOSIS — Z139 Encounter for screening, unspecified: Secondary | ICD-10-CM

## 2010-09-24 ENCOUNTER — Encounter: Payer: Self-pay | Admitting: Orthopedic Surgery

## 2010-09-24 ENCOUNTER — Ambulatory Visit: Payer: Self-pay | Admitting: Orthopedic Surgery

## 2010-09-29 ENCOUNTER — Ambulatory Visit (HOSPITAL_COMMUNITY)
Admission: RE | Admit: 2010-09-29 | Discharge: 2010-09-29 | Disposition: A | Discharge status: Home / Self Care | Payer: Medicare Other | Source: Ambulatory Visit | Attending: Orthopedic Surgery | Admitting: Orthopedic Surgery

## 2010-09-29 DIAGNOSIS — Z1231 Encounter for screening mammogram for malignant neoplasm of breast: Secondary | ICD-10-CM | POA: Insufficient documentation

## 2010-09-29 DIAGNOSIS — Z139 Encounter for screening, unspecified: Secondary | ICD-10-CM

## 2010-10-28 NOTE — Op Note (Signed)
[name] [name], [name] [name]          ACCOUNT NO.:  [id number]   MEDICAL RECORD NO.:  [id number]          PATIENT TYPE:  AMB   LOCATION:  DAY                           FACILITY:  [facility]   PHYSICIAN:  R. Roetta Sessions, M.D. DATE OF BIRTH:  1920/12/26   DATE OF PROCEDURE:  04/13/2007  DATE OF DISCHARGE:                               OPERATIVE REPORT   PROCEDURE:  Diagnostic colonoscopy.   INDICATIONS FOR PROCEDURE:  An 77 year old lady recent bout of rectal  pain for which she saw Dr. Holley Bouche.  there was concern of a possible  rectal mass.  She has a history of iron-deficiency anemia and was slated  to have a colonoscopy earlier this year but did not follow through.  She  was recently found to have a soft fecal impaction and was Hemoccult  negative.  She was treated with magnesium citrate and Fleet's enema with  resolution of the impaction.  She is here to undergo a diagnostic  colonoscopy.  Potential risks, benefits and alternatives have been  reviewed, questions answered, she is agreeable.  Please see  documentation in the medical record.   PROCEDURE NOTE:  O2 saturation, blood pressure, pulse, and respirations  were monitored throughout the entire procedure.   CONSCIOUS SEDATION:  Versed 3 mg IV, Demerol 50 mg IV in divided doses.   INSTRUMENT:  Pentax video chip system.   FINDINGS:  Digital rectal exam revealed no abnormalities.  There was no  appreciable mass, no fluctuance, no tenderness on digital exam.   ENDOSCOPIC FINDINGS:  The prep was marginal.   Colon:  Colonic mucosa was surveyed from the rectosigmoid junction  through the left, transverse, right colon to the area of appendiceal  orifice, ileocecal valve and cecum.  These structures were well seen and  photographed for the record.  From this level the scope was slowly  withdrawn.  All previously mentioned mucosal surfaces were again seen  with careful inspection on the way out.  The patient had wide-mouth  densely  populated left sided diverticula.  The remainder of the colonic  mucosa appeared normal.  Scope was pulled down in the rectum where  thorough examination of the rectal mucosa including retroflexed view of  the anal verge demonstrated only some minimal internal hemorrhoids.  Rectal mucosa, otherwise, well seen and appeared normal.  The patient  tolerated the procedure well as reactive to endoscopy.   IMPRESSION:  Internal hemorrhoids, otherwise normal rectum.  Densely  populated left-sided diverticula.  Remainder of the  colonic mucosa  appeared normal.   I suspect the fecal impaction was causing the mass effect and patient  discomfort earlier.  Clinical course and physical findings not really  consistent with a perirectal abscess in addition, nothing really to  suggest direct rectocele or cystocele, although those things are not  absolutely ruled out at this time.   RECOMMENDATIONS:  1. Diverticulosis and constipation literature provided to Ms.      Clinton [name].  Daily Metamucil or Citrucel fiber supplement.  2. MiraLax 17 grams orally at bedtime p.r.n. constipation.  At this      point would hold up  on further evaluation, i.e., pelvic CT and/or      gynecology evaluation as long as she is clinically doing well.      Having said that, would like to see her back in 3 months and      recheck CBC and see where we stand.   I would like to thank Dr. Holley Bouche for allowing me to see this nice  lady today.      Jonathon Bellows, M.D.  Electronically Signed     RMR/MEDQ  D:  04/13/2007  T:  04/13/2007  Job:  [id number]   cc:   Franchot Heidelberg, M.D.   Laverle Hobby, M.D.  [address]  [place], [place] [address]

## 2010-10-28 NOTE — Op Note (Signed)
[name] [name], HARAN          ACCOUNT NO.:  [id number]   MEDICAL RECORD NO.:  [id number]          PATIENT TYPE:  AMB   LOCATION:  DAY                           FACILITY:  [facility]   PHYSICIAN:  R. Roetta Sessions, M.D. DATE OF BIRTH:  02-19-1921   DATE OF PROCEDURE:  DATE OF DISCHARGE:                               OPERATIVE REPORT   PROCEDURE:  Small bowel capsule study.   INDICATIONS FOR PROCEDURE:  An 77 year old lady with progressive anemia,  significant weight loss, and Hemoccult positive stool.   Colonoscopy in 2008 demonstrated only internal hemorrhoids and left-  sided diverticula.  EGD on Nov 01, 2008 demonstrated a noncritical  Schatzki's ring, hiatal hernia, some antral erosions, and an area of  healing ulceration in the antrum most consistent with nonsteroidal agent  use.   This area of inflamed the gastric mucosa was biopsied.  Biopsy revealed  chronic gastritis with ulceration.  There was no evidence of H. pylori  or malignancy.   Given progressive anemia and significant weight loss, a capsule study  has been performed.   FINDINGS:  First gastric image acquired at 36 seconds and there were  some superficial antral erosions noted consistent with the EGD findings.  First duodenal image was acquired at 1 hour and 22 minutes.   FINDINGS:  1. Area of phlebectasia seen at 2 hours and 13 minutes.  2. Area of vascular abnormality with intense blue color consistent      with blue rubber bleb nevus syndrome seen at approximately 3 hours      and 21 minutes.  3. At 4 hours and 5 minutes, a large sprawling a circumferential      tubular neoplastic process was identified which slowed transit of      the capsule down.  This appeared to have some length.  The capsule      traversed this segment and at 8 hours into the study, I believe it      to reach the ileocecal valve, but it did not traverse the ileocecal      valve.  The cecum was not seen; so, consequently technically  this      was incomplete study.   IMPRESSION:  1. Bulky circumferential neoplastic appearing process seen in the      small bowel concerning for at a minimum a villous adenoma if not      carcinoma versus small-bowel lymphoma (likely the source of ongoing      anemia and Hemoccult positive stool).  2. 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  3. Single area of phlebectasia not clinically significant.   RECOMMENDATIONS:  Will move towards a CAT scan enterography study  forthwith to further evaluate the abnormality seen on capsule study.  She will likely need surgical consultation in the near future.      Jonathon Bellows, M.D.  Electronically Signed     RMR/MEDQ  D:  11/16/2008  T:  11/16/2008  Job:  [id number]   cc:   Franchot Heidelberg, M.D.

## 2010-10-28 NOTE — Consult Note (Signed)
[name] [name], [name] [name]          ACCOUNT NO.:  [id number]   MEDICAL RECORD NO.:  [id number]          PATIENT TYPE:  AMB   LOCATION:  DAY                           FACILITY:  [facility]   PHYSICIAN:  R. Roetta Sessions, M.D. DATE OF BIRTH:  [birth year]   DATE OF CONSULTATION:  02/01/2007  DATE OF DISCHARGE:                                 CONSULTATION   REASON FOR CONSULTATION:  Anemia.   HISTORY OF PRESENT ILLNESS:  [name] [name] is an 77 year old female.  She was diagnosed with iron-deficiency anemia.  She was found to have  this problem approximately six months ago.  She was found to have an  iron of 32, a UIBC of 222, a TIBC of 254, a percent sat of 13%, a normal  B12 and a ferritin of 254.  Her last hemoglobin was from January 01, 2007.  She was found to have a hemoglobin of 10.2 and hematocrit of 33.5 with  MCV of 86.3.  She does have renal insufficiency with creatinine 1.26 and  BUN of 24.  She tells me she had a colonoscopy by Dr. Katrinka Blazing and this was  done on November 27, 2004.  She had multiple diverticula and otherwise  normal exam.  She denies any rectal bleeding or melena.  She does have  occasional constipation for which she takes magnesium citrate once or  twice a month.  She denies any history of anemia prior to this.  She  denies any heartburn, indigestion or other satiety, dysphagia or  odynophagia.  She had been taking ibuprofen as well as Cox II for her  history of osteoarthritis, however, this was discontinued through Dr.  Claire Shown office.  She is now on Tylenol arthritis.  She denies any  abdominal pain.   PAST MEDICAL HISTORY:  1. Osteoarthritis.  2. Hypertension.  3. Hypercholesterolemia.  4. Constipation.  5. Osteoarthritis.  6. Osteoporosis.  7. She had a colonoscopy November 27, 2004, by Dr. Katrinka Blazing which showed      multiple diverticula.  8. She is status post hysterectomy.  9. She had a benign cyst removed from her chest.  10.She has history of GERD.   CURRENT  MEDICATIONS:  1. Lotrel 10/20 mg daily.  2. Prilosec 20 mg daily.  3. Pravachol 20 mg daily.  4. Neurontin 10 mg daily.  5. Vicodin 5/500 mg q.6h. p.r.n.  6. Caltrate 600 mg with vitamin D daily.  7. Tylenol arthritis p.r.n.  8. Fosamax 70 mg weekly.   ALLERGIES:  NO KNOWN DRUG ALLERGIES.   FAMILY HISTORY:  Positive for a brother having esophageal problems.  She  is unsure as to whether this was cancer or not.  He is still alive and  healthy.   SOCIAL HISTORY:  [name] [name] is a widow.  She lives alone.  Her  husband died approximately two months ago.  She is a retired  Advertising copywriter.  Denies any alcohol, tobacco or drug use.   REVIEW OF SYSTEMS:  See HPI.  HEMATOLOGIC:  Denies any bruising or easy  bleeding or epistaxis.   PHYSICAL EXAMINATION:  VITAL SIGNS:  Weight 146 pounds, height 61  inches, temperature 97.9, blood pressure 110/70, pulse 68.  GENERAL APPEARANCE:  [name] [name] is an 77 year old female who is  alert, oriented, pleasant and cooperative in no acute distress.  HEENT:  Sclerae are clear and nonicteric.  Conjunctivae are pink.  Oral  pharynx pink and moist without any lesions.  NECK:  Supple without masses or thyromegaly.  LUNGS:  Clear to auscultation bilaterally.  HEART:  Regular rate and rhythm with normal S1 and S2 without murmurs,  clicks, rubs or gallops.  ABDOMEN:  Positive bowel sounds x4, no bruits auscultated.  Soft,  nontender, nondistended without palpable mass or hepatosplenomegaly.  No  rebound tenderness or guarding.  EXTREMITIES:  Without any clubbing. She does have trace bilateral  pretibial edema.  SKIN:  Pink, warm and dry without rash or jaundice.   IMPRESSION:  [name] [name] is an 77 year old female with a six-month  history of anemia with low iron indices and normal ferritin.  She had  colonoscopy two years ago done elsewhere, which showed diverticulosis.  She denies any overt gastrointestinal bleeding.  She also has chronic  renal  insufficiency which could be contributing to her anemia.   PLAN:  1. Hemoccult stools x3.  2. If stools are positive, will consider EGD and also consider      repeating colonoscopy but I will need to discuss this first with      Dr. Jena Gauss.   We would like to thank Dr. Erby Pian for allowing [place] to participate in  the care of [name] [name].      Lorenza Burton, N.P.      Jonathon Bellows, M.D.  Electronically Signed    KJ/MEDQ  D:  02/01/2007  T:  02/01/2007  Job:  [id number]

## 2010-10-28 NOTE — Op Note (Signed)
[name] [name], [name]          ACCOUNT NO.:  [id number]   MEDICAL RECORD NO.:  [id number]          PATIENT TYPE:  AMB   LOCATION:  DAY                           FACILITY:  [facility]   PHYSICIAN:  R. Roetta Sessions, M.D. DATE OF BIRTH:  [birth year]   DATE OF PROCEDURE:  11/01/2008  DATE OF DISCHARGE:                               OPERATIVE REPORT   PROCEDURE:  EGD with biopsy.   INDICATIONS FOR PROCEDURE:  An 77 year old lady with progressive anemia,  Hemoccult positive stool.  Colonoscopy in October of 2008 demonstrated  internal hemorrhoids and dense left-sided diverticula.  She failed to  come back to see [place].  She continues to have an anemia which has  progressed.  She is Hemoccult positive through Dr. Claire Shown office.  She has been using regular ibuprofen and Aleve.  She was also taking  aspirin and Fosamax which was recently stopped.  She had been on  Celebrex as well.  She has had no gross GI bleeding.  No odynophagia, no  dysphagia.  EGD is now being done to further evaluate her symptoms.  Risks, benefits, alternatives and limitations have been reviewed,  questions answered.  Please see documentation in the medical record.   PROCEDURE NOTE:  O2 saturation, blood pressure, pulse and respirations  were monitored throughout the entire procedure.  Conscious sedation  Versed 3 mg IV, Demerol 50 mg IV in divided doses.  Cetacaine spray for  topical pharyngeal anesthesia.  Instrument Pentax video chip system.   FINDINGS:  Examination of the tubular esophagus revealed a noncritical  appearing Schatzki's ring otherwise esophageal mucosa appeared entirely  normal.  EG junction was patulous and easily traversed.  Oral gastric  cavity was emptied and insufflated well with air.  Thorough examination  of the gastric mucosa including retroflexed proximal stomach and  esophagogastric junction demonstrated hiatal hernia and a clustering of  antral erosions and healing ulcerated areas, the  largest area is  approximately 8 mm in dimension.  There did not appear to be an  infiltrating process.  There was surrounding intense erythema around  these lesions.  Pylorus was patent and easily traversed.  Examination of  the bulb and second portion revealed no abnormalities.  Therapeutic/diagnostic maneuvers were performed.  Antral biopsies were  taken to assess these lesions further.  The patient tolerated the  procedure well and was reactive after endoscopy.   IMPRESSION:  1. Noncritical Schatzki's ring otherwise normal esophagus (no dilation      as the patient denies dysphagia).  2. Hiatal hernia.  3. A clustering of antral erosions and areas of healing ulceration      likely benign and likely NSAID related status post biopsy,      otherwise normal stomach, patent pylorus and normal D1 and D2.   I suspect [name] [name] [name] has sustained an NSAID insult to her GI tract.  She may have significant lesions downstream in her small bowel as well.   RECOMMENDATIONS:  1. She should refrain from taking nonsteroidals for now.  Also it      would be prudent for her to avoid Fosamax as she would be  at risk      for a potential pill impaction with a Schatzki's ring.  2. Because of her significant weight loss I feel it is important to go      ahead and image her small bowel just to      make sure that she does not have a tumor that has remained occult      to this point.  Consequently I have recommended [name] [name] [name] come      back for a capsule study of her small bowel to make further      recommendations after that procedure has been done.      Jonathon Bellows, M.D.  Electronically Signed     RMR/MEDQ  D:  11/01/2008  T:  11/01/2008  Job:  [id number]   cc:   Franchot Heidelberg, M.D.

## 2010-10-28 NOTE — H&P (Signed)
[name] [name], [name] [name]          ACCOUNT NO.:  [id number]   MEDICAL RECORD NO.:  [id number]          PATIENT TYPE:  AMB   LOCATION:  DAY                           FACILITY:  [facility]   PHYSICIAN:  R. Roetta Sessions, M.D. DATE OF BIRTH:  28-May-1921   DATE OF ADMISSION:  DATE OF DISCHARGE:  LH                              HISTORY & PHYSICAL   REASON FOR CONSULTATION:  Possible rectal mass.   REQUESTING PHYSICIAN:  Laverle Hobby, M.D., [facility].   HISTORY OF PRESENT ILLNESS:  [name] [name] presents back today at the  request of Dr. Wende Crease for further evaluation of possible rectal mass.  Patient presented to his urgent care clinic yesterday with complaints of  rectal pain, for which she thought was due to hemorrhoids.  She had been  using Preparation H and not feeling any better.  She was having pain  with defecation and constipation.  On internal exam, he felt that she  either had a mass or stool impaction.  She was worked in today.  She  states that she has occasional constipation and may take magnesium  citrate once a month.  She generally has stool several times a week.  Denies any blood in the stool.  No abdominal pain, nausea, vomiting, or  heartburn.  She lost 9 pounds since we saw her in August.  She  contributes this to the loss of her husband.  When we saw her in August,  it was for iron-deficiency anemia, and we actually scheduled her for a  colonoscopy.  She states that she forgot about it and did not show up.   CURRENT MEDICATIONS:  1. Pravachol 20 mg daily.  2. Vicodin 5/500 every 6 hours as needed.  3. Caltrate 600 plus D daily.  4. Tylenol Arthritis p.r.n.  5. Fosamax 70 mg weekly.  6. Norvasc 10 mg daily.  7. Omeprazole 20 mg daily.   ALLERGIES:  No known drug allergies.   PAST MEDICAL HISTORY:  1. Osteoarthritis.  2. Hypertension.  3. Hypercholesterolemia.  4. Constipation.  5. Osteoporosis.  6. GERD.  7. Colonoscopy in June,  2006 by Dr. Katrinka Blazing, which showed multiple      diverticula.  8. Hysterectomy.  9. Benign cyst removed from her chest.   FAMILY HISTORY:  Brother had esophageal problems.  She is unsure whether  or not it was cancer.   SOCIAL HISTORY:  She is recently widowed.  She lives alone.  Her husband  died over the summertime.  She is a retired Advertising copywriter.  She denies  alcohol, drug use, or tobacco use.   REVIEW OF SYSTEMS:  See HPI for GI.  CONSTITUTIONAL:  See HPI.  CARDIOPULMONARY:  No chest pain, shortness of breath.   PHYSICAL EXAMINATION:  Weight 137.  Height 5 feet 1.  Temp 98.  Blood  pressure 120/64.  Pulse 72.  GENERAL:  A pleasant, well-developed and well-nourished African-American  female in no acute distress.  SKIN:  Warm and dry.  No jaundice.  HEENT:  Sclerae are anicteric.  Oropharyngeal mucosa moist and pink.  No  lesions, erythema, or  exudate.  No lymphadenopathy or thyromegaly.  CHEST:  Lungs are clear to auscultation.  CARDIAC:  Regular rate and rhythm.  Normal S1 and S2.  No murmurs, rubs  or gallops.  ABDOMEN:  Positive bowel sounds.  Abdomen is soft and nontender,  nondistended.  No organomegaly or masses.  No rebound tenderness or  guarding.  RECTAL:  No lesions externally.  She had a very large, soft fecal  impaction on exam.  Stool was brown and Hemoccult negative.  Stool was  very soft, but there was a large amount in the more proximal rectum.   IMPRESSION:  [name] [name] is an 77 year old lady with a history of  iron-deficiency anemia for which we offered a colonoscopy a couple of  weeks ago.  She forgot about the appointment.  She now presents with a  fecal impaction.   PLAN:  1. Will reschedule her colonoscopy with Dr. Jena Gauss.  2. Written instructions provided to the patient.  She will use one      Fleet enema per rectum when she gets home.  She will consume a      bottle of magnesium citrate.  If she has no results, she may repeat      the second Fleet  enema.  She has been given instructions on how to      get in touch with the on-call physician, if needed over the      weekend,      or she may go to the emergency department if she does not have      relief.  In addition, we gave her Analpram-HC cream to apply      anorectal t.i.d. for discomfort.  Three tubes and samples provided.   I would like to thank Laverle Hobby for allowing [place] to take part in the  care of this patient.      Tana Coast, P.AJonathon Bellows, M.D.  Electronically Signed    LL/MEDQ  D:  04/01/2007  T:  04/01/2007  Job:  [id number]   cc:   Franchot Heidelberg, M.D.   Laverle Hobby, M.D.  [address]  [place], [place] [address]

## 2010-10-31 NOTE — H&P (Signed)
[facility]  Patient:    [name] [name], [name] [name] Visit Number: [id number] MRN: [id number]          Service Type: OUT Location: RAD Attending Physician:  Syliva Overman Dictated by:   Elpidio Anis, M.D. Admit Date:  04/19/2001 Discharge Date: 04/19/2001                           History and Physical  HISTORY OF PRESENT ILLNESS:  A 77 year old female with recurrent upper abdominal pain and no nausea or vomiting.  The pain is persistent despite meals, and it is sometimes worse at night.  CT of the abdomen was positive for liver and splenic hemangiomas.  She has been treated with Nexium without improvement.  A stress test was done and it was normal.  Echocardiogram showed normal LV function.  PAST HISTORY: 1. Positive for chronic gastritis. 2. Osteoarthritis. 3. Atypical chest pain.  PHYSICAL EXAMINATION:  VITAL SIGNS:  Blood pressure 170/88, pulse 60, respirations 18, weight 190 pounds.  HEENT:  Unremarkable.  NECK:  Supple without JVD or bruit.  CHEST:  Clear to auscultation.  HEART:  Regular rate and rhythm without murmur, gallop or rub.  ABDOMEN:  Epigastric and right upper quadrant tenderness, normal bowel sounds, moderate distention.  RECTAL:  Stools guaiac negative.  EXTREMITIES:  Mild tenderness of the right knee with mild synovial thickening, otherwise normal.  No cyanosis, clubbing or edema.  NEUROLOGIC EXAM:  Nonfocal.  No motor, sensory or cerebellar deficits.  IMPRESSION: 1. Recurrent severe dyspepsia, unresponsive to treatment. 2. Osteoarthritis. 3. History of splenic and hepatic hemangiomas.  PLAN:  Upper endoscopy. Dictated by:   Elpidio Anis, M.D. Attending Physician:  Syliva Overman DD:  05/10/01 TD:  05/11/01 Job: [id number]

## 2010-10-31 NOTE — H&P (Signed)
[name] [name], SIEMEN          ACCOUNT NO.:  [id number]   MEDICAL RECORD NO.:  [id number]          PATIENT TYPE:  AMB   LOCATION:  DAY                           FACILITY:  [facility]   PHYSICIAN:  Jerolyn Shin C. Katrinka Blazing, M.D.   DATE OF BIRTH:  1920-08-07   DATE OF ADMISSION:  11/28/2004  DATE OF DISCHARGE:  LH                                HISTORY & PHYSICAL   HISTORY OF PRESENT ILLNESS:  An 77 year old female referred for screening  colonoscopy. No family history of colon cancer. She denies rectal bleeding  or change in bowel habits. She does have intermittent constipation but this  has been a life long problem. There is no history of blood in her stool.  Stool guaiac is negative.   PAST MEDICAL HISTORY:  Positive for hypertension, osteoarthritis,  hyperlipidemia.   PAST SURGICAL HISTORY:  A hysterectomy with unilateral oophorectomy.   MEDICATIONS:  Celebrex 200 mg daily, Zocor 20 mg at bedtime, Lotrel 10/20  daily, aspirin 81 mg daily, Tylox 1 three times daily as needed for pain.   ALLERGIES:  None.   PHYSICAL EXAMINATION:  VITAL SIGNS:  Blood pressure 150/78, pulse 52,  respiratory rate 20, weight 161 pounds.  HEENT:  Unremarkable.  NECK:  Supple. No jugular venous distention, bruits, adenopathy, or  thyromegaly.  CHEST:  Clear to auscultation.  HEART:  Regular rate and rhythm. Without murmur, rub, or gallop.  ABDOMEN:  Soft, non-tender, and nondistended.  EXTREMITIES:  Mild crepitus of knees. Decreased range of motion of hips.  Mild synovial thickening of fingers bilaterally.  NEUROLOGIC:  No focal, motor, sensory, or cerebellar deficits.   IMPRESSION:  1.  Need for screening colonoscopy.  2.  Hypertension.  3.  Osteoarthritis.  4.  Hyperlipidemia.   PLAN:  Screening colonoscopy.       LCS/MEDQ  D:  11/26/2004  T:  11/27/2004  Job:  [id number]

## 2010-11-26 ENCOUNTER — Ambulatory Visit (HOSPITAL_COMMUNITY): Payer: Medicare Other | Admitting: Oncology

## 2010-12-04 ENCOUNTER — Encounter (HOSPITAL_COMMUNITY): Payer: Medicare Other | Attending: Oncology

## 2010-12-04 DIAGNOSIS — D509 Iron deficiency anemia, unspecified: Secondary | ICD-10-CM | POA: Insufficient documentation

## 2010-12-04 DIAGNOSIS — E785 Hyperlipidemia, unspecified: Secondary | ICD-10-CM | POA: Insufficient documentation

## 2010-12-04 DIAGNOSIS — Z79899 Other long term (current) drug therapy: Secondary | ICD-10-CM | POA: Insufficient documentation

## 2010-12-04 DIAGNOSIS — I10 Essential (primary) hypertension: Secondary | ICD-10-CM | POA: Insufficient documentation

## 2010-12-04 DIAGNOSIS — D649 Anemia, unspecified: Secondary | ICD-10-CM

## 2010-12-08 ENCOUNTER — Other Ambulatory Visit (HOSPITAL_COMMUNITY): Payer: Self-pay | Admitting: Oncology

## 2010-12-08 ENCOUNTER — Encounter (HOSPITAL_COMMUNITY): Payer: Medicare Other

## 2010-12-08 DIAGNOSIS — I10 Essential (primary) hypertension: Secondary | ICD-10-CM

## 2010-12-08 DIAGNOSIS — D509 Iron deficiency anemia, unspecified: Secondary | ICD-10-CM

## 2010-12-08 DIAGNOSIS — D649 Anemia, unspecified: Secondary | ICD-10-CM

## 2010-12-08 LAB — CBC
Hemoglobin: 9 g/dL — ABNORMAL LOW (ref 12.0–15.0)
MCHC: 31.3 g/dL (ref 30.0–36.0)
Platelets: 175 10*3/uL (ref 150–400)
RDW: 16.4 % — ABNORMAL HIGH (ref 11.5–15.5)

## 2010-12-08 LAB — RETICULOCYTES
RBC.: 3.64 MIL/uL — ABNORMAL LOW (ref 3.87–5.11)
Retic Ct Pct: 1 % (ref 0.4–3.1)

## 2010-12-08 LAB — DIFFERENTIAL
Basophils Relative: 1 % (ref 0–1)
Lymphocytes Relative: 18 % (ref 12–46)
Neutrophils Relative %: 68 % (ref 43–77)

## 2010-12-08 LAB — BASIC METABOLIC PANEL
BUN: 27 mg/dL — ABNORMAL HIGH (ref 6–23)
Calcium: 9 mg/dL (ref 8.4–10.5)
Creatinine, Ser: 0.97 mg/dL (ref 0.50–1.10)
GFR calc Af Amer: >60 mL/min (ref >60)
GFR calc non Af Amer: 54 mL/min — ABNORMAL LOW (ref >60)
Potassium: 4.3 mEq/L (ref 3.5–5.1)

## 2010-12-22 ENCOUNTER — Emergency Department (HOSPITAL_COMMUNITY)
Admission: EM | Admit: 2010-12-22 | Discharge: 2010-12-23 | Discharge status: Against Medical Advice | Payer: Self-pay | Attending: *Deleted | Admitting: *Deleted

## 2010-12-22 DIAGNOSIS — M25559 Pain in unspecified hip: Secondary | ICD-10-CM | POA: Insufficient documentation

## 2010-12-22 DIAGNOSIS — Z5309 Procedure and treatment not carried out because of other contraindication: Secondary | ICD-10-CM | POA: Insufficient documentation

## 2010-12-23 ENCOUNTER — Encounter (HOSPITAL_COMMUNITY): Payer: Self-pay

## 2010-12-23 ENCOUNTER — Inpatient Hospital Stay (HOSPITAL_COMMUNITY)
Admission: EM | Admit: 2010-12-23 | Discharge: 2010-12-24 | DRG: 536 | Disposition: A | Discharge status: Skilled Nursing Facility | Payer: Medicare Other | Attending: Internal Medicine | Admitting: Internal Medicine

## 2010-12-23 ENCOUNTER — Emergency Department (HOSPITAL_COMMUNITY): Payer: Medicare Other

## 2010-12-23 DIAGNOSIS — N289 Disorder of kidney and ureter, unspecified: Secondary | ICD-10-CM | POA: Diagnosis present

## 2010-12-23 DIAGNOSIS — D649 Anemia, unspecified: Secondary | ICD-10-CM | POA: Diagnosis present

## 2010-12-23 DIAGNOSIS — M069 Rheumatoid arthritis, unspecified: Secondary | ICD-10-CM | POA: Diagnosis present

## 2010-12-23 DIAGNOSIS — W010XXA Fall on same level from slipping, tripping and stumbling without subsequent striking against object, initial encounter: Secondary | ICD-10-CM | POA: Diagnosis present

## 2010-12-23 DIAGNOSIS — I10 Essential (primary) hypertension: Secondary | ICD-10-CM | POA: Diagnosis present

## 2010-12-23 DIAGNOSIS — N181 Chronic kidney disease, stage 1: Secondary | ICD-10-CM | POA: Diagnosis present

## 2010-12-23 DIAGNOSIS — S32509A Unspecified fracture of unspecified pubis, initial encounter for closed fracture: Principal | ICD-10-CM | POA: Diagnosis present

## 2010-12-23 DIAGNOSIS — I498 Other specified cardiac arrhythmias: Secondary | ICD-10-CM | POA: Diagnosis not present

## 2010-12-23 DIAGNOSIS — K219 Gastro-esophageal reflux disease without esophagitis: Secondary | ICD-10-CM | POA: Diagnosis present

## 2010-12-23 DIAGNOSIS — I129 Hypertensive chronic kidney disease with stage 1 through stage 4 chronic kidney disease, or unspecified chronic kidney disease: Secondary | ICD-10-CM | POA: Diagnosis present

## 2010-12-23 DIAGNOSIS — N390 Urinary tract infection, site not specified: Secondary | ICD-10-CM | POA: Diagnosis present

## 2010-12-23 DIAGNOSIS — S32599A Other specified fracture of unspecified pubis, initial encounter for closed fracture: Secondary | ICD-10-CM

## 2010-12-23 DIAGNOSIS — D638 Anemia in other chronic diseases classified elsewhere: Secondary | ICD-10-CM | POA: Diagnosis present

## 2010-12-23 LAB — COMPREHENSIVE METABOLIC PANEL
AST: 16 U/L (ref 0–37)
BUN: 22 mg/dL (ref 6–23)
CO2: 26 mEq/L (ref 19–32)
Calcium: 9.3 mg/dL (ref 8.4–10.5)
Chloride: 102 mEq/L (ref 96–112)
Creatinine, Ser: 1.14 mg/dL — ABNORMAL HIGH (ref 0.50–1.10)
GFR calc Af Amer: 54 mL/min — ABNORMAL LOW (ref >60)
GFR calc non Af Amer: 45 mL/min — ABNORMAL LOW (ref >60)
Glucose, Bld: 108 mg/dL — ABNORMAL HIGH (ref 70–99)
Total Bilirubin: 0.2 mg/dL — ABNORMAL LOW (ref 0.3–1.2)

## 2010-12-23 LAB — URINALYSIS, ROUTINE W REFLEX MICROSCOPIC
Nitrite: NEGATIVE
Protein, ur: NEGATIVE mg/dL
Specific Gravity, Urine: 1.025 (ref 1.005–1.030)
Urobilinogen, UA: 0.2 mg/dL (ref 0.0–1.0)

## 2010-12-23 LAB — URINE MICROSCOPIC-ADD ON

## 2010-12-23 LAB — CBC
HCT: 29.1 % — ABNORMAL LOW (ref 36.0–46.0)
MCH: 25.4 pg — ABNORMAL LOW (ref 26.0–34.0)
MCV: 80.4 fL (ref 78.0–100.0)
Platelets: 165 10*3/uL (ref 150–400)
RBC: 3.62 MIL/uL — ABNORMAL LOW (ref 3.87–5.11)
WBC: 6.8 10*3/uL (ref 4.0–10.5)

## 2010-12-23 MED ORDER — SODIUM CHLORIDE 0.9 % IJ SOLN
3.0000 mL | Freq: Two times a day (BID) | INTRAMUSCULAR | Status: DC
  Filled 2010-12-23: qty 3

## 2010-12-23 MED ORDER — AMLODIPINE BESY-BENAZEPRIL HCL 10-20 MG PO CAPS: 1.0000 | ORAL_CAPSULE | Freq: Every day | ORAL | Status: DC

## 2010-12-23 MED ORDER — OXYCODONE HCL 5 MG PO TABS
5.0000 mg | ORAL_TABLET | ORAL | Status: DC | PRN
  Administered 2010-12-23 – 2010-12-24 (×4): 5 mg via ORAL
  Filled 2010-12-23 (×4): qty 1

## 2010-12-23 MED ORDER — MORPHINE SULFATE 2 MG/ML IJ SOLN: 2.0000 mg | INTRAMUSCULAR | Status: DC | PRN

## 2010-12-23 MED ORDER — ALBUTEROL SULFATE (2.5 MG/3ML) 0.083% IN NEBU: 2.5000 mg | INHALATION_SOLUTION | RESPIRATORY_TRACT | Status: DC | PRN

## 2010-12-23 MED ORDER — PREDNISONE 10 MG PO TABS
5.0000 mg | ORAL_TABLET | Freq: Every day | ORAL | Status: DC
  Administered 2010-12-23 – 2010-12-24 (×2): 5 mg via ORAL
  Filled 2010-12-23 (×2): qty 1

## 2010-12-23 MED ORDER — SENNA 8.6 MG PO TABS: 2.0000 | ORAL_TABLET | Freq: Every day | ORAL | Status: DC | PRN

## 2010-12-23 MED ORDER — ONDANSETRON HCL 4 MG/2ML IJ SOLN: 4.0000 mg | Freq: Four times a day (QID) | INTRAMUSCULAR | Status: DC | PRN

## 2010-12-23 MED ORDER — PROMETHAZINE HCL 25 MG/ML IJ SOLN: 12.5000 mg | Freq: Four times a day (QID) | INTRAMUSCULAR | Status: DC | PRN

## 2010-12-23 MED ORDER — ACETAMINOPHEN 325 MG PO TABS: 650.0000 mg | ORAL_TABLET | Freq: Four times a day (QID) | ORAL | Status: DC | PRN

## 2010-12-23 MED ORDER — HEPARIN SODIUM (PORCINE) 5000 UNIT/ML IJ SOLN
5000.0000 [IU] | Freq: Three times a day (TID) | INTRAMUSCULAR | Status: DC
  Administered 2010-12-23 – 2010-12-24 (×2): 5000 [IU] via SUBCUTANEOUS
  Filled 2010-12-23 (×2): qty 1

## 2010-12-23 MED ORDER — PANTOPRAZOLE SODIUM 40 MG PO TBEC
40.0000 mg | DELAYED_RELEASE_TABLET | Freq: Every day | ORAL | Status: DC
  Administered 2010-12-23 – 2010-12-24 (×2): 40 mg via ORAL
  Filled 2010-12-23 (×2): qty 1

## 2010-12-23 MED ORDER — FLEET ENEMA 7-19 GM/118ML RE ENEM: 1.0000 | ENEMA | Freq: Every day | RECTAL | Status: DC | PRN

## 2010-12-23 MED ORDER — BENAZEPRIL HCL 10 MG PO TABS
20.0000 mg | ORAL_TABLET | Freq: Every day | ORAL | Status: DC
  Administered 2010-12-23 – 2010-12-24 (×2): 20 mg via ORAL
  Filled 2010-12-23 (×2): qty 2

## 2010-12-23 MED ORDER — ALUM & MAG HYDROXIDE-SIMETH 200-200-20 MG/5ML PO SUSP: 30.0000 mL | Freq: Four times a day (QID) | ORAL | Status: DC | PRN

## 2010-12-23 MED ORDER — AMLODIPINE BESYLATE 5 MG PO TABS
10.0000 mg | ORAL_TABLET | Freq: Every day | ORAL | Status: DC
  Administered 2010-12-23 – 2010-12-24 (×2): 10 mg via ORAL
  Filled 2010-12-23 (×2): qty 2

## 2010-12-23 NOTE — ED Notes (Signed)
Patient denies pain and is resting comfortably.  

## 2010-12-23 NOTE — Progress Notes (Signed)
Subjective: Patient doing okay, complains of mild left hip pain with some extension down the anterior aspect of her left leg. Denies any recollection of fall. She states that she was milligram when she felt something break.  Objective: Vital signs in last 24 hours: Temp:  [97.8 F (36.6 C)-98.6 F (37 C)] 98.6 F (37 C) (07/10 0800) Pulse Rate:  [60-68] 60  (07/10 0800) Resp:  [16-18] 18  (07/10 0800) BP: (133-171)/(60-83) 161/60 mmHg (07/10 0800) SpO2:  [97 %-98 %] 98 % (07/10 0800) Weight:  [48.7 kg (107 lb 5.8 oz)] 107 lb 5.8 oz (48.7 kg) (07/10 0800) Weight change:   General appearance: alert, cooperative, appears stated age and no distress Head: Normocephalic, without obvious abnormality, atraumatic Resp: clear to auscultation bilaterally Chest wall: no tenderness Cardio: S1-S2, soft 2/6 systolic ejection murmur. GI: soft, non-tender; bowel sounds normal; no masses,  no organomegaly Extremities: extremities normal, atraumatic, no cyanosis or edema-deferred musculoskeletal exam secondary to pending weightbearing status. Pulses: 1+Bilat Skin: Skin color, texture, turgor normal. No rashes or lesions  Lab Results:  [facility] 12/23/10 0545  NA 137  K 3.9  CL 102  CO2 26  GLUCOSE 108*  BUN 22  CREATININE 1.14*  CALCIUM 9.3    Basename 12/23/10 0545  WBC 6.8  HGB 9.2*  HCT 29.1*  PLT 165    Studies/Results: Dg Hip Complete Left  12/23/2010   IMPRESSION: Degenerative changes in both hips with severe degenerative change and protrusion acetabuli on the left.  Suggestion of nondisplaced superior and inferior pubic ramus fractures on the left.     Ct Hip Left Wo Contrast  12/23/2010  *RADIOLOGY REPORT*  Clinical Data: Left hip pain.  CT OF THE LEFT HIP WITHOUT CONTRAST    IMPRESSION:  1.  Prominent protrusio acetabuli and the underlying degenerative spurring of the left femoral head and acetabulum, with suspected progressive volume loss in the left femoral head, and some  increased space between the convex medial quadrilateral plate and the femoral head which may be due to fluid in the joint or progressive collapse, or a combination of the two. If there is fever/leukocytosis causing clinical concern for septic hip, arthrocentesis might be considered. 2.  Old deformities of the left pubic rami compatible with prior fracture. 3.  Stable arthropathy of the sacroiliac joints.  Medications:  Scheduled:   . amLODipine  10 mg Oral Daily  . benazepril  20 mg Oral Daily  . heparin  5,000 Units Subcutaneous Q8H  . pantoprazole  40 mg Oral Q1200  . predniSONE  5 mg Oral Daily  . sodium chloride  3 mL Intravenous Q12H  . DISCONTD: amLODipine-benazepril  1 capsule Oral Daily  . DISCONTD: amLODipine-benazepril  1 capsule Oral Daily    Assessment/Plan: Principal Problem:  *Pubic bone fracture-likely non-op. Orthopedics to see. PT OT to work once weightbearing status identified. Patient was recently in a short-term-SNF and was recently taken home by the family for just a few days. After these events the family is now looking at permanent SNF placement.  2. Chronic kidney disease-stage I: Stable. 3. Hypertension-we'll continue to monitor especially in setting of fracture and pain. 4. rheumatoid arthritis-will continue on prednisone, this is likely contributing factors to her fracture. 5. GERD-continue PPI.   LOS: 0 days   Essie Gehret K 12/23/2010, 11:39 AM

## 2010-12-23 NOTE — ED Provider Notes (Addendum)
History     Chief Complaint  Patient presents with  . Hip Pain   Patient is a 77 y.o. female presenting with hip pain and fall. The history is provided by the nursing home.  Hip Pain The current episode started 1 to 2 hours ago. The problem has been gradually improving. The symptoms are aggravated by nothing. The symptoms are relieved by nothing.  Fall The accident occurred less than 1 hour ago. The fall occurred while standing. She fell from a height of 1 to 2 ft. She landed on a hard floor. There was no blood loss. The point of impact was the left hip. The pain is present in the left hip. The pain is at a severity of 6/10. She was not ambulatory at the scene. Pertinent negatives include no numbness and no loss of consciousness. The symptoms are aggravated by activity and pressure on the injury. Treatment on scene includes a c-collar and a backboard. She has tried nothing for the symptoms.    Past Medical History  Diagnosis Date  . GERD (gastroesophageal reflux disease)   . Complete rupture of rotator cuff   . Pain in joint, shoulder region   . Chronic kidney disease, stage I   . Iron deficiency anemia, unspecified   . Bereavement, uncomplicated   . Edema   . Hyperglycemia   . Unspecified constipation   . Osteoporosis, unspecified   . OA (osteoarthritis)   . Unspecified essential hypertension   . Other and unspecified hyperlipidemia   . Diverticulosis of colon (without mention of hemorrhage)     Past Surgical History  Procedure Date  . Abdominal hysterectomy   . Benign cyst removal from lateral chest     Family History  Problem Relation Age of Onset  . Stroke Father   . Dementia Mother   . Dementia Sister   . Cancer Brother     throat   . Nephrolithiasis Brother     History  Substance Use Topics  . Smoking status: Former Games developer  . Smokeless tobacco: Not on file  . Alcohol Use: No    OB History    Grav Para Term Preterm Abortions TAB SAB Ect Mult Living             Review of Systems  Neurological: Negative for loss of consciousness and numbness.  All other systems reviewed and are negative.    Physical Exam  BP 171/83  Pulse 68  Temp(Src) 97.8 F (36.6 C) (Oral)  Resp 16  SpO2 97%  Physical Exam  Constitutional: She appears well-developed and well-nourished.  HENT:  Head: Normocephalic and atraumatic.  Right Ear: External ear normal.  Left Ear: External ear normal.  Mouth/Throat: Oropharynx is clear and moist.  Eyes: Conjunctivae and EOM are normal. Pupils are equal, round, and reactive to light.  Neck: Normal range of motion. Neck supple.       c-collar removed  Cardiovascular: Normal rate and regular rhythm.   Pulmonary/Chest: Effort normal and breath sounds normal.  Abdominal: Soft. Bowel sounds are normal.  Musculoskeletal:       Left hip with pain to palpation. Limited ROM due to discomfort. Pulses 2+. Arthririxchanges to hands , feet, knees    ED Course  Procedures  MDM      Nicoletta Dress. Colon Branch, MD 12/23/10 [id number]  Nicoletta Dress. Colon Branch, MD 12/23/10 [phone]

## 2010-12-23 NOTE — ED Notes (Signed)
Sleeping soundly lying on back with knees elevated. For admission, family has gone home

## 2010-12-23 NOTE — Progress Notes (Signed)
0530 Reviewed results with wife and daughter. Patient is to  return to nursing home.

## 2010-12-23 NOTE — ED Notes (Signed)
Remains fully alert and oriented. Comfortable with knees supported on pillow. Has asked for and used bedpan. Able to lift hips enough for fx pan to be used. Aware of admission.

## 2010-12-23 NOTE — H&P (Signed)
[name] [name] is an 77 y.o. female.  Her primary care physician is Dr. Isac Sarna  Chief Complaint: Pain in the left hip  HPI: This is 77 year old, African American female, who presented to the hospital after a fall. She apparently tripped and fell. There was no reported loss of consciousness. Patient started complaining of pain in the left hip area. EMS got her into the hospital. Patient says that she has pain in the left hip however, is able to bend her knee. She denies any chest pain or shortness of breath. Denies any nausea, vomiting, abdominal pain. Denies any fever or chills. She lives by herself. She uses 2 canes to ambulate. The pain in the left hip is about 4 of 10 in intensity. Beyond that she is unable to describe it any further.   Prior to Admission medications   Medication Sig Start Date End Date Taking? Authorizing Provider  omeprazole (PRILOSEC) 20 MG capsule Take 20 mg by mouth daily.     Yes Historical Provider, MD  traMADol (ULTRAM) 50 MG tablet Take 50 mg by mouth 2 (two) times daily as needed.     Yes Historical Provider, MD  amLODipine-benazepril (LOTREL) 10-20 MG per capsule Take 1 capsule by mouth daily.      Historical Provider, MD  predniSONE (DELTASONE) 5 MG tablet Take 5 mg by mouth daily.      Historical Provider, MD  predniSONE, Pak, (STERAPRED) 5 MG TABS Take by mouth. As directed for 12 days       Historical Provider, MD    Allergies: No Known Allergies  Past Medical History  Diagnosis Date  . GERD (gastroesophageal reflux disease)   . Complete rupture of rotator cuff   . Pain in joint, shoulder region   . Chronic kidney disease, stage I   . Iron deficiency anemia, unspecified   . Bereavement, uncomplicated   . Edema   . Hyperglycemia   . Unspecified constipation   . Osteoporosis, unspecified   . OA (osteoarthritis)   . Unspecified essential hypertension   . Other and unspecified hyperlipidemia   . Diverticulosis of colon (without mention  of hemorrhage)     Past Surgical History  Procedure Date  . Abdominal hysterectomy   . Benign cyst removal from lateral chest     Social History:  reports that she has quit smoking. She does not have any smokeless tobacco history on file. She reports that she does not drink alcohol or use illicit drugs.  Family History:  Family History  Problem Relation Age of Onset  . Stroke Father   . Dementia Mother   . Dementia Sister   . Cancer Brother     throat   . Nephrolithiasis Brother     Review of Systems  HENT: Negative.   Eyes: Negative.   Respiratory: Negative.   Cardiovascular: Negative.   Gastrointestinal: Negative.   Genitourinary: Negative.   Musculoskeletal: Positive for joint pain and falls.  Skin: Negative.   Neurological: Positive for weakness. Negative for dizziness, tingling and focal weakness.  Endo/Heme/Allergies: Negative.   Psychiatric/Behavioral: Negative.   All other systems reviewed and are negative.    Blood pressure 133/64, pulse 68, temperature 97.8 F (36.6 C), temperature source Oral, resp. rate 16, SpO2 97.00%. Physical Exam  Vitals reviewed. Constitutional: She is oriented to person, place, and time. She appears well-developed and well-nourished. No distress.  HENT:  Head: Normocephalic and atraumatic.  Mouth/Throat: No oropharyngeal exudate.  Eyes: EOM are normal. Pupils  are equal, round, and reactive to light.  Neck: Normal range of motion. Neck supple. No JVD present. No tracheal deviation present. No thyromegaly present.  Cardiovascular: Normal rate, regular rhythm, normal heart sounds and intact distal pulses.  Exam reveals no gallop and no friction rub.   No murmur heard. Pulmonary/Chest: Effort normal. No stridor. No respiratory distress. She has no wheezes. She has no rales. She exhibits no tenderness.  Abdominal: Soft. Bowel sounds are normal. She exhibits no distension and no mass. There is no tenderness. There is no rebound and no  guarding.  Musculoskeletal:       Left hip: She exhibits decreased range of motion, tenderness and bony tenderness. She exhibits no swelling and no crepitus.  Lymphadenopathy:    She has no cervical adenopathy.  Neurological: She is alert and oriented to person, place, and time. No cranial nerve deficit.  Skin: Skin is warm and dry.  Psychiatric: She has a normal mood and affect.   Labs are pending at this time. There is some computer downtime that is going on.  No results found for this or any previous visit (from the past 48 hour(s)). No results found. Dg Hip Complete Left  12/23/2010  *RADIOLOGY REPORT*  Clinical Data: Possible fall.  LEFT HIP - COMPLETE 2+ VIEW  Comparison: 08/31/2008  Findings: Degenerative changes in the hips bilaterally, more prominent on the left side.  On the left, there is deformity and irregularity of the femoral head with protrusio acetabulum. Cortical irregularities demonstrated in the superior inferior pubic rami on the left, new since the previous study, suggesting nondisplaced fractures.  Vascular calcifications.  Calcifications in the soft tissues consistent with injection granulomas.  IMPRESSION: Degenerative changes in both hips with severe degenerative change and protrusion acetabuli on the left.  Suggestion of nondisplaced superior and inferior pubic ramus fractures on the left.  Original Report Authenticated By: Marlon Pel, M.D.    Assessment/Plan  Principal Problem:  *Pubic bone fracture   So, this is a 77 year old, African American female, who tripped and fell at home. She has sustained what appears to be pubic rami fractures on the left side. This the x-ray was not entirely clear on this. Patient lives by herself.  We will proceed with a CT of the left hip to make sure there is no hip fracture. We will have the patient seen by Dr. Romeo Apple, who is her orthopedic surgeon. We'll have PT, OT involved once the CTs of the hip is negative for any  fractures. Pain control will be provided. DVT prophylaxis provided.  Patient has a history of hypertension and anemia. We'll get blood work done on her. We'll continue with her antihypertensive agents.  For unclear lesions patient is on prednisone. We'll continue it for now and await clarification in the morning.  She will likely require placement.  Further management decisions will depend on results of further testing and patient's response to treatment.  Mishael Krysiak 12/23/2010, 5:31 AM

## 2010-12-23 NOTE — ED Notes (Signed)
Fell walking with her usual 2 canes. Tripped. C/o pain to left hip/buttock

## 2010-12-24 LAB — CBC
HCT: 28.7 % — ABNORMAL LOW (ref 36.0–46.0)
MCHC: 31.7 g/dL (ref 30.0–36.0)
MCV: 80.2 fL (ref 78.0–100.0)
RDW: 17.9 % — ABNORMAL HIGH (ref 11.5–15.5)

## 2010-12-24 LAB — COMPREHENSIVE METABOLIC PANEL
ALT: 9 U/L (ref 0–35)
Alkaline Phosphatase: 72 U/L (ref 39–117)
GFR calc Af Amer: >60 mL/min (ref >60)
Glucose, Bld: 92 mg/dL (ref 70–99)
Potassium: 3.5 mEq/L (ref 3.5–5.1)
Sodium: 135 mEq/L (ref 135–145)
Total Protein: 6.9 g/dL (ref 6.0–8.3)

## 2010-12-24 MED ORDER — TRAMADOL HCL 50 MG PO TABS: 50.0000 mg | ORAL_TABLET | Freq: Four times a day (QID) | ORAL | Status: DC | PRN

## 2010-12-24 MED ORDER — LEVOFLOXACIN 250 MG PO TABS: 250.0000 mg | ORAL_TABLET | Freq: Every day | ORAL | Status: AC

## 2010-12-24 MED ORDER — LEVOFLOXACIN 250 MG PO TABS
250.0000 mg | ORAL_TABLET | Freq: Every day | ORAL | Status: AC
  Administered 2010-12-24: 250 mg via ORAL
  Filled 2010-12-24: qty 1

## 2010-12-24 MED ORDER — ACETAMINOPHEN 325 MG PO TABS: 650.0000 mg | ORAL_TABLET | Freq: Four times a day (QID) | ORAL | Status: AC | PRN

## 2010-12-24 MED ORDER — SENNA 8.6 MG PO TABS: 2.0000 | ORAL_TABLET | Freq: Every day | ORAL | Status: DC | PRN

## 2010-12-24 MED ORDER — POTASSIUM CHLORIDE ER 10 MEQ PO TBCR: 10.0000 meq | EXTENDED_RELEASE_TABLET | Freq: Every day | ORAL | Status: DC

## 2010-12-24 MED ORDER — MULTI-VITAMIN/MINERALS PO TABS: 1.0000 | ORAL_TABLET | Freq: Every day | ORAL | Status: DC

## 2010-12-24 NOTE — Consult Note (Signed)
Pt d/c today by MD to SNF. Pt accepts bed at [facility]. Pt's friend to transport. Blue Medicare authorization complete.  No other CSW needs reported. Pt notified of copays. Karn Cassis

## 2010-12-24 NOTE — Discharge Summary (Deleted)
Physician Discharge Summary  Patient ID: [name] [name] MRN: [id number] DOB/AGE: 1921/06/10 77 y.o.  Admit date: 12/23/2010 Discharge date: 12/24/2010  Admission Diagnoses: PLEASE REFER TO DISCHARGE SUMMARY #[id number] FOR FURTHER DETAILS. Discharge Diagnoses:  Principal Problem:  *Pubic bone fracture Active Problems:  Hypertension  Anemia of chronic disease  Rheumatoid arthritis  Chronic kidney disease, stage I  GERD (gastroesophageal reflux disease)   Discharge Exam: Blood pressure 165/63, pulse 55, temperature 98.2 F (36.8 C), temperature source Oral, resp. rate 18, height 5\' 1"  (1.549 m), weight 48.7 kg (107 lb 5.8 oz), SpO2 97.00%.   Disposition: Final discharge disposition not confirmed  Discharge Orders    Future Appointments: Provider: Department: Dept Phone: Center:   01/06/2011 4:15 PM Fuller Canada, MD [facility] [phone] [facility]   01/07/2011 2:40 PM Randall An, MD [facility] [phone] None     Future Orders Please Complete By Expires   Diet - low sodium heart healthy      Increase activity slowly      Comments:   Weight bearing with walker daily as tolerated.   Nursing communication      Comments:   Please make appointment with Dr. Hilda Lias in 1 week.     Current Discharge Medication List    START taking these medications   Details  acetaminophen (TYLENOL) 325 MG tablet Take 2 tablets (650 mg total) by mouth every 6 (six) hours as needed (or Fever >/= 101). Qty: 30 tablet, Refills: 1    levofloxacin (LEVAQUIN) 250 MG tablet Take 1 tablet (250 mg total) by mouth daily. Qty: 4 tablet, Refills: 0    Multiple Vitamins-Minerals (MULTIVITAMIN WITH MINERALS) tablet Take 1 tablet by mouth daily. Qty: 120 tablet, Refills: 2    potassium chloride (K-DUR) 10 MEQ tablet Take 1 tablet (10 mEq total) by mouth daily. Qty: 7 tablet, Refills: 0    senna (SENOKOT) 8.6 MG TABS Take 2 tablets (17.2 mg total) by mouth daily as needed. Qty: 120  each, Refills: 1      CONTINUE these medications which have CHANGED   Details  traMADol (ULTRAM) 50 MG tablet Take 1 tablet (50 mg total) by mouth every 6 (six) hours as needed. Qty: 60 tablet, Refills: 1      CONTINUE these medications which have NOT CHANGED   Details  amLODipine (NORVASC) 10 MG tablet Take 10 mg by mouth daily.      benazepril (LOTENSIN) 20 MG tablet Take 20 mg by mouth daily.      omeprazole (PRILOSEC) 20 MG capsule Take 20 mg by mouth daily.      OVER THE COUNTER MEDICATION Take 1 tablet by mouth daily. Calcium plus Zinc. Per patient and patients sister.       STOP taking these medications     ibuprofen (ADVIL,MOTRIN) 200 MG tablet      ibuprofen (ADVIL,MOTRIN) 200 MG tablet      amLODipine-benazepril (LOTREL) 10-20 MG per capsule      predniSONE (DELTASONE) 5 MG tablet      predniSONE, Pak, (STERAPRED) 5 MG TABS          Signed: Ameli Sangiovanni 12/24/2010, 1:18 PM

## 2010-12-24 NOTE — Progress Notes (Signed)
Report called to Nurse at [facility] in [place], [place].

## 2010-12-24 NOTE — Progress Notes (Signed)
Ortho:  She got up out of bed yesterday several times and did well. She will need a walker and elevated toilet seat when she goes home. I can see in the office a week to ten days after discharge.

## 2010-12-24 NOTE — Progress Notes (Cosign Needed)
UR Chart Review Completed  

## 2010-12-24 NOTE — Progress Notes (Signed)
Physical Therapy Treatment Patient Name: [name] [name] JXBJY'N Date: 12/24/2010 Problem List:  Patient Active Problem List  Diagnoses  . LIVER MASS  . HYPERLIPIDEMIA  . ANEMIA, IRON DEFICIENCY  . HYPERTENSION  . GERD  . FECAL IMPACTION  . DIVERTICULOSIS, COLON  . CONSTIPATION NOS  . GUAIAC POSITIVE STOOL  . KIDNEY DISEASE, CHRONIC, STAGE I  . OSTEOARTHRITIS  . SHOULDER PAIN  . HIP PAIN, LEFT  . RUPTURE ROTATOR CUFF  . OSTEOPOROSIS  . Pubic bone fracture  . Hypertension  . Anemia of chronic disease  . Rheumatoid arthritis  . Chronic kidney disease, stage I  . GERD (gastroesophageal reflux disease)   Past Medical History:  Past Medical History  Diagnosis Date  . GERD (gastroesophageal reflux disease)   . Complete rupture of rotator cuff   . Pain in joint, shoulder region   . Chronic kidney disease, stage I   . Iron deficiency anemia, unspecified   . Bereavement, uncomplicated   . Edema   . Hyperglycemia   . Unspecified constipation   . Osteoporosis, unspecified   . OA (osteoarthritis)   . Unspecified essential hypertension   . Other and unspecified hyperlipidemia   . Diverticulosis of colon (without mention of hemorrhage)    Past Surgical History:  Past Surgical History  Procedure Date  . Abdominal hysterectomy   . Benign cyst removal from lateral chest    Precautions/Restrictions  Precautions Precautions: Fall Required Braces or Orthoses: No Restrictions Weight Bearing Restrictions: No Other Position/Activity Restrictions:  (limited by pain with mobility) Mobility (including Balance) Bed Mobility Bed Mobility: Yes Supine to Sit: 6: Modified independent (Device/Increase time) Supine to Sit Details (indicate cue type and reason):  (HOB elevated to 45 degrees) Transfers Transfers: Yes Sit to Stand: 4: Min assist;From chair/3-in-1;With upper extremity assist;From bed Sit to Stand Details (indicate cue type and reason): cueing for handplacement for  the walker.  From bed to RW, from 3 in 1 to RW.   Stand to Sit: 5: Supervision;To chair/3-in-1;To elevated surface;With armrests Stand to Sit Details: cueing for handplacement. Ambulation/Gait Ambulation/Gait: Yes Ambulation/Gait Assistance: 5: Supervision Ambulation/Gait Assistance Details (indicate cue type and reason): cueing for step length Ambulation Distance (Feet): 44 Feet Assistive device: Rolling walker Gait Pattern: Decreased step length - left;Antalgic Gait velocity:  (very slow, labored movements) Stairs: No Wheelchair Mobility Wheelchair Mobility: No  Posture/Postural Control Posture/Postural Control: No significant limitations Balance Balance Assessed: No Exercise  Total Joint Exercises Ankle Circles/Pumps: AROM;Both;10 reps;Supine Short Arc Quad: AROM;Both;10 reps Heel Slides: AROM;Both;10 reps Hip ABduction/ADduction: AAROM;Both;10 reps;Supine Straight Leg Raises: AROM;Both;10 reps Long Arc Quad: AROM;Both;10 reps General Exercises - Lower Extremity Ankle Circles/Pumps: AROM;Both;10 reps;Supine Short Arc Quad: AROM;Both;10 reps Long Arc Quad: AROM;Both;10 reps Heel Slides: AROM;Both;10 reps Hip ABduction/ADduction: AAROM;Both;10 reps;Supine Straight Leg Raises: AROM;Both;10 reps Hip Flexion/Marching: AROM;10 reps Low Level/ICU Exercises Ankle Circles/Pumps: AROM;Both;10 reps;Supine Short Arc Quad: AROM;Both;10 reps Hip ABduction/ADduction: AAROM;Both;10 reps;Supine Heel Slides: AROM;Both;10 reps  End of Session PT - End of Session Equipment Utilized During Treatment: Gait belt Activity Tolerance: Patient tolerated treatment well Patient left: in chair;with call bell in reach;with family/visitor present Nurse Communication: Mobility status for transfers General Behavior During Session: [facility] for tasks performed Cognition: [facility] for tasks performed PT Assessment/Plan  PT - Assessment/Plan PT Frequency: Min 5X/week Follow Up Recommendations: Skilled  nursing facility Equipment Recommended: Defer to next venue PT Goals  Acute Rehab PT Goals PT Goal Formulation: With patient Time For Goal Achievement: 2 weeks Pt will go Supine/Side  to Sit: with supervision Pt will Transfer Sit to Stand/Stand to Sit: with min assist Pt will Transfer Bed to Chair/Chair to Bed: with min assist Pt will Ambulate: 16 - 50 feet Time In: 1135 Time Out: 1215 Charges: 1 TE, 2 TA  [name] [name] 12/24/2010, 1:11 PM

## 2010-12-24 NOTE — Discharge Summary (Signed)
[name] [name], [name] [name]          ACCOUNT NO.:  [id number]  MEDICAL RECORD NO.:  [id number]  LOCATION:  A339                          FACILITY:  [facility]  PHYSICIAN:  Elliot Cousin, M.D.    DATE OF BIRTH:  26-Oct-1920  DATE OF ADMISSION:  12/23/2010 DATE OF DISCHARGE:  07/11/2012LH                         DISCHARGE SUMMARY-REFERRING   DISCHARGE DIAGNOSES: 1. Pelvic and left hip pain, status post fall.  The x-ray of her left     hip revealed a suggestion of nondisplaced superior and inferior     pubic ramus fractures on the left, but the CT scan of her hip     revealed an old left sacrum/left pubic rami fracture with     degenerative spurring of the left femoral head and acetabulum with     suspected progressive volume loss in the left femoral head. 2. Chronic anemia.  The patient's hemoglobin was 9.1 prior to     discharge.  She is followed by hematologist, Dr. Mariel Sleet and     gastroenterologist, Dr. Jena Gauss for monitoring of her anemia. 3. Status post capsule endoscopy which showed a small bowel mass in     January 2010 by Dr. Jena Gauss.  An EGD in May 2010 revealed NSAID-     induced antral erosions, hiatal hernia, and noncritical Schatzki's     ring.  CT enterography in June 2010 was unable to show a small     bowel mass, however, it did reveal a 5-cm hemangioma of the liver.     MRI enterography was performed in October 2010 showing no mass.     Colonoscopy in 2008 revealed left-sided diverticulosis and internal     hemorrhoids. 4. Possible urinary tract infection. 5. Hypertension. 6. Borderline hypokalemia. 7. Mild sinus bradycardia. 8. Acute renal insufficiency superimposed on stage I chronic kidney     disease. 9. Rheumatoid arthritis. 10.Gastroesophageal reflux disease.  DISCHARGE MEDICATIONS: 1. Acetaminophen 325 mg 2 tablets every 6 hours as needed for pain or     fever. 2. Levaquin 250 mg daily for four more days. 3. Multivitamin with iron once daily. 4. Potassium  chloride 10 mEq daily. 5. Senokot-S 2 tablets daily as needed for constipation. 6. Tramadol 50 mg 1 tablet every 6 hours as needed for pain. 7. Norvasc 10 mg daily. 8. Benazepril 20 mg daily. 9. Omeprazole 20 mg daily.  DISCHARGE DISPOSITION:  The patient was discharged to the [facility] in [place].  She will follow up with orthopedic surgeon, Dr. Romeo Apple on January 06, 2011, at 4:15 p.m. She will follow up with hematologist/oncologist Dr. Glenford Peers on January 07, 2011, at 2:40 p.m.  She will need to follow up with Dr. Jena Gauss in 1-2 weeks.  She will need to follow up with her primary care physician following discharge from the skilled nursing facility.  CONSULTATIONS:  Teola Bradley, MD  PROCEDURE PERFORMED:  CT scan of the left hip without contrast on December 23, 2010.  The results revealed prominent protrusio acetabuli and underlying degenerative spurring of the left femoral head and acetabulum with suspected progressive volume loss in the left femoral head and some increased space between the convex medial, quadrilateral plate, and the  femoral head which may be due to fluid in the joint or progressive collapse or combination of the two.  Old deformities of the left pubic rami compatible with prior fracture.  Stable arthropathy of the sacroiliac joints.  HISTORY OF PRESENT ILLNESS:  The patient is an 77 year old woman with a past medical history significant for chronic anemia, hypertension, and peptic ulcer disease, who presented to the emergency department on December 23, 2010, with a chief complaint of left hip pain after tripping and falling.  She did not lose consciousness.  When EMS arrived, she complained of left hip pain.  In the emergency department, she was noted to be afebrile and hemodynamically stable.  She was neurologically intact.  The x-ray of her left hip revealed findings suggestive of  superior/inferior pubic rami fractures  on the left, nondisplaced.  She was therefore admitted for further evaluation and management.  HOSPITAL COURSE:  The patient was started on pain management with ibuprofen and tramadol as needed.  As-needed oxycodone was added for severe pain.  Apparently, orthopedic surgeon, Dr. Romeo Apple was called by the emergency department physician.  The tentative plan was for the patient to follow up with him in the outpatient setting.  However, Dr. Darreld Mclean, orthopedic surgeon, did evaluate the patient.  Per his assessment, the patient was ambulating with the physical therapist and with the assistance of a walker.  He recommended pain management and further assistance with therapy.  He saw no reason for the patient to remain in the hospital.  He recommended that she follow up with either Dr. Romeo Apple or him in 1-2 weeks.  An appointment was made for the patient to follow up with Dr. Romeo Apple as stated above.  For further evaluation, a CT scan of her left hip was ordered.  The results were dictated above.  There was no obvious CT scan findings of an acute fracture.  The physical therapist evaluated the patient yesterday and today.  Per my assessment, the patient was in less pain.  She was in good spirits.  I witnessed her ambulating in the room with the physical therapist with some discomfort, but she was optimistic about getting better.  The patient remained hemodynamically stable for the most part.  She does have a history of chronic hypertension.  Her systolic blood pressures ranged from the 140s to the 160s.  She remained on benazepril and amlodipine.  Her heart rate fell briefly into the 50s, however, she had no complaints of chest pain or dizziness.  Her creatinine was initially 1.14 on admission.  She was started on gentle IV fluid hydration. Following the IV fluids, her creatinine improved to 0.98.  Her urinalysis was suggestive of infection, although she had no complaints of painful  urination.  Nevertheless, under the circumstances, she was started on Levaquin.  She has a history of chronic anemia dating back several years.  Her hemoglobin ranged from 9.1-9.2.  She has had some gastrointestinal abnormalities as dictated above.  She continues to be followed by gastroenterologist, Dr. Jena Gauss and hematologist, Dr. Mariel Sleet for monitoring of her anemia.  The patient was also found to be mildly thrombocytopenic.  Her platelet count was 165 on admission, but fell to 146 prior to discharge.  This was felt to be secondary to the dilutional effects of the IV fluids.  Ibuprofen was discontinued secondary to her history of NSAID-induced peptic ulcer disease.  Her pain will continue to be treated with Tylenol and tramadol as needed in the outpatient setting.  A TSH was ordered to evaluate for bradycardia, the result was pending at the time of hospital discharge.  CODE STATUS:  Full code.  DIET:  Heart-healthy.  ACTIVITY:  Physical therapy with the walker and with physical therapy as tolerated daily.     Elliot Cousin, M.D.     DF/MEDQ  D:  12/24/2010  T:  12/24/2010  Job:  [id number]  cc:   R. Roetta Sessions, MD FACP [facility] [address] [place] [address]  Vickki Hearing, M.D. Fax: [phone]  Ladona Horns. Mariel Sleet, MD Fax: [phone]  Milus Mallick. Lodema Hong, M.D. Fax: [phone]

## 2010-12-24 NOTE — Plan of Care (Signed)
Problem: Phase I Progression Outcomes Goal: OOB as tolerated unless otherwise ordered Outcome: Adequate for Discharge Pt ambulating with walker and one person assist   Problem: Phase III Progression Outcomes Goal: Voiding independently Outcome: Adequate for Discharge Pt requires one person assist to [facility]

## 2010-12-24 NOTE — Progress Notes (Signed)
Subjective: Pt has no increase in hip or pelvic pain. Denies pain w/urination.  Objective: Vital signs in last 24 hours: Temp:  [98.2 F (36.8 C)-99.4 F (37.4 C)] 98.2 F (36.8 C) (07/11 0459) Pulse Rate:  [55-65] 55  (07/11 0459) Resp:  [18-20] 18  (07/11 0459) BP: (148-165)/(63-80) 165/63 mmHg (07/11 0459) SpO2:  [95 %-97 %] 97 % (07/11 0459) Weight change:  Last BM Date: 12/23/10  Intake/Output from previous day: 07/10 0701 - 07/11 0700 In: 520 [P.O.:520] Out: 1250 [Urine:1250] Intake/Output this shift: I/O this shift: In: 360 [P.O.:360] Out: -   General appearance: alert Resp: clear to auscultation bilaterally Cardio: S1, S2 normal GI: Positive BS; mild pelvic/suprpubic tenderness; no distention. Extremities: No pedal edema; mild left hip tenderness.  Lab Results:  Basename 12/24/10 0525 12/23/10 0545  WBC 4.5 6.8  HGB 9.1* 9.2*  HCT 28.7* 29.1*  PLT 146* 165   BMET  Basename 12/24/10 0525 12/23/10 0545  NA 135 137  K 3.5 3.9  CL 102 102  CO2 26 26  GLUCOSE 92 108*  BUN 19 22  CREATININE 0.98 1.14*  CALCIUM 9.0 9.3    Studies/Results: No results found.  Medications: I have reviewed the patient's current medications.  Assessment/Plan:    1. Left Hip pain; sec to fall and old pelvic fracture; and DJD of left hip per CT hip. 2.Possible UTI. 3.HTN. 4.Chronic anemia. 5.Borderline hypokalemia.  Plan Will start Levaquin empirically;  Replete potassium; check TSH; add; d/c NSAID due to previous GI bleed.   Treat Pain with Tylenol and Tramadol.   D/c to SNF today for rehab.      LOS: 1 day   Elba Dendinger 12/24/2010, 12:18 PM

## 2010-12-24 NOTE — Consult Note (Signed)
NAMERICHARD, HOLZ          ACCOUNT NO.:  [id number]  MEDICAL RECORD NO.:  [id number]  LOCATION:  A339                          FACILITY:  [facility]  PHYSICIAN:  J. Darreld Mclean, M.D. DATE OF BIRTH:  [birth year]  DATE OF CONSULTATION:  12/23/2010 DATE OF DISCHARGE:                                CONSULTATION   HISTORY OF PRESENT ILLNESS:  The patient was seen at the request of hospitalist.  The patient is an 77 year old female whose x-rays revealed a pubic fracture on the left side of the inferior and superior pubic rami.  No other injuries.  The patient states very little pain while laying in bed.  I reviewed the x-rays.  She does not have a fracture of her hip except of the pelvis area as stated.  Motion of the hip is not that painful while she is in bed.  IMPRESSION:  Fracture, left pelvis, inferior and superior pubic rami on the left.  PLAN:  Bedrest, physical therapy, weightbearing as tolerated, and use of a walker.  I will see her in my office in approximately 2 weeks.          ______________________________ Shela Commons. Darreld Mclean, M.D.     JWK/MEDQ  D:  12/23/2010  T:  12/24/2010  Job:  [id number]

## 2011-01-06 ENCOUNTER — Ambulatory Visit (INDEPENDENT_AMBULATORY_CARE_PROVIDER_SITE_OTHER): Payer: Medicare Other | Admitting: Orthopedic Surgery

## 2011-01-06 DIAGNOSIS — M25552 Pain in left hip: Secondary | ICD-10-CM

## 2011-01-06 DIAGNOSIS — S329XXA Fracture of unspecified parts of lumbosacral spine and pelvis, initial encounter for closed fracture: Secondary | ICD-10-CM

## 2011-01-06 DIAGNOSIS — M25559 Pain in unspecified hip: Secondary | ICD-10-CM

## 2011-01-06 MED ORDER — METHYLPREDNISOLONE ACETATE 40 MG/ML IJ SUSP: 40.0000 mg | Freq: Once | INTRAMUSCULAR | Status: DC

## 2011-01-06 NOTE — Progress Notes (Signed)
Pelvic fracture, LEFT.  Patient was seen at the hospital by Dr. Hilda Lias in followup scheduled here, since she's been a previous patient of ours.  CT Scan and radiographs show probable LEFT pelvic fracture.  Patient is in a rehabilitation center currently receiving physical therapy and ambulating with a walker and is scheduled to go home by the end of the week.  She would like an injection for pain in her hip and shoulder.  We gave her an IM shot in the LEFT hip.  This was administered by the nurse.  Depo-Medrol 40 mg and lidocaine 1% 3 cc  Diagnoses #1 LEFT pelvic fracture, continuation of care, and fracture care.

## 2011-01-06 NOTE — Progress Notes (Signed)
Chief complaint bilateral shoulder pain, LEFT hip pain.  History of bilateral shoulder rotator cuff disease nonoperable because of age.  Bilateral shoulder pain.  Review of systems also being seen for pelvic fracture, LEFT side.  Injection LEFT hip for pain.  Depo-Medrol injection.  Diagnosis bilateral shoulder pain, bilateral rotator cuff disease.

## 2011-01-07 ENCOUNTER — Encounter (HOSPITAL_COMMUNITY): Payer: Medicare Other | Attending: Oncology | Admitting: Oncology

## 2011-01-07 DIAGNOSIS — D638 Anemia in other chronic diseases classified elsewhere: Secondary | ICD-10-CM | POA: Insufficient documentation

## 2011-01-07 LAB — CBC
MCH: 25.6 pg — ABNORMAL LOW (ref 26.0–34.0)
MCHC: 31.3 g/dL (ref 30.0–36.0)
Platelets: 262 10*3/uL (ref 150–400)
RBC: 4.3 MIL/uL (ref 3.87–5.11)

## 2011-01-07 LAB — RETICULOCYTES: Retic Ct Pct: 0.8 % (ref 0.4–3.1)

## 2011-01-07 MED ORDER — EPOETIN ALFA 40000 UNIT/ML IJ SOLN
40000.0000 [IU] | INTRAMUSCULAR | Status: DC
  Administered 2011-01-07: 40000 [IU] via SUBCUTANEOUS

## 2011-01-07 MED ORDER — EPOETIN ALFA 40000 UNIT/ML IJ SOLN
INTRAMUSCULAR | Status: AC
  Administered 2011-01-07: 40000 [IU] via SUBCUTANEOUS
  Filled 2011-01-07: qty 1

## 2011-01-07 NOTE — Progress Notes (Signed)
[name] [name] presents today for injection per MD orders. Procrit 40000units administered SQ in left Abdomen. Administration without incident. Patient tolerated well.

## 2011-01-07 NOTE — Progress Notes (Signed)
This office note has been dictated.

## 2011-01-14 ENCOUNTER — Ambulatory Visit (HOSPITAL_COMMUNITY): Payer: Self-pay

## 2011-02-05 ENCOUNTER — Encounter: Payer: Self-pay | Admitting: Orthopedic Surgery

## 2011-02-05 ENCOUNTER — Ambulatory Visit (INDEPENDENT_AMBULATORY_CARE_PROVIDER_SITE_OTHER): Payer: Medicare Other | Admitting: Orthopedic Surgery

## 2011-02-05 DIAGNOSIS — S329XXA Fracture of unspecified parts of lumbosacral spine and pelvis, initial encounter for closed fracture: Secondary | ICD-10-CM

## 2011-02-05 DIAGNOSIS — M75120 Complete rotator cuff tear or rupture of unspecified shoulder, not specified as traumatic: Secondary | ICD-10-CM

## 2011-02-05 MED ORDER — METHYLPREDNISOLONE ACETATE 40 MG/ML IJ SUSP: 40.0000 mg | Freq: Once | INTRAMUSCULAR | Status: DC

## 2011-02-05 NOTE — Progress Notes (Signed)
CC:   Leonidas Romberg, MD Isac Sarna, MD  DIAGNOSES: 1. Anemia of chronic disease with no improvement after IV Feraheme     infusion by Dr. Donnie Coffin. 2. Rheumatoid arthritis times many years with chronic hand changes     consistent with that diagnosis. 3. History of hyperlipidemia. 4. History of hypertension. 5. History of gastroesophageal reflux disease. 6. History of abnormal segment of her small bowel seen on a capsule     camera study that was not duplicated by other studies.  [name] [name] is here for followup.  She actually was in the hospital in July about 3 weeks after her IV iron infusion but her hemoglobin had not budged.  She had no problems with the IV Feraheme but her hemoglobin which was 9.0 on 06/25 was unchanged at 9.1 essentially when she was in the hospital with a fractured hip.  Her pubic rami were fractured but not displaced and that was for the superior and inferior pubic ramus areas on the left.  She is here today with a son, sister and another son.  Her husband died years ago of cancer.  He was my patient.  Vital signs are unremarkable today.  In looking back at her CT scan from last year, she did not have splenomegaly.  I do not think it is worthwhile checking her rheumatoid factor level at this time because of that.  I doubt that she has Felty syndrome with just anemia alone but I do think she has anemia of chronic disease.  Therefore, I think it is worth a trial of Procrit since she is weak and tired and does not ambulate well of course for several reasons now.  If we can make her a little stronger she knows that we would continue it, but if she is not any better, if her hemoglobin rises to the 11 to 11.5 g range, then we will stop it and not retreat her with it at that time.  We are happy to see her again in 13 weeks.  Will check a CBC and retic count every 4 weeks and get a new baseline today.    ______________________________ Ladona Horns. Mariel Sleet,  MD ESN/MEDQ  D:  01/07/2011  T:  01/07/2011  Job:  [id number]

## 2011-02-05 NOTE — Progress Notes (Signed)
Pelvic fracture, LEFT. July 10 Patient was seen at the hospital by Dr. Hilda Lias in followup scheduled here, since she's been a previous patient of ours.  CT Scan and radiographs show probable LEFT pelvic fracture.  Patient is in a rehabilitation center currently receiving physical therapy and ambulating with a walker and is scheduled to go home by the end of the week.  Here today for x-ray.  She is ambulating with 2 canes    Pelvic x-rays show healed pelvic fracture on the LEFT.  Acetabular protrusion LEFT.  Stable.  Impression healed pelvic fracture old acetabular protrusion LEFT hip

## 2011-02-05 NOTE — Patient Instructions (Signed)
Return to clinic as needed

## 2011-02-12 NOTE — Progress Notes (Signed)
Encounter addended by: Clarene Critchley on: 02/12/2011  6:13 AM<BR>     Documentation filed: Flowsheet VN

## 2011-02-13 ENCOUNTER — Emergency Department (HOSPITAL_COMMUNITY)
Admission: EM | Admit: 2011-02-13 | Discharge: 2011-02-13 | Disposition: A | Discharge status: Home / Self Care | Payer: Medicare Other | Attending: Emergency Medicine | Admitting: Emergency Medicine

## 2011-02-13 ENCOUNTER — Encounter (HOSPITAL_COMMUNITY): Payer: Self-pay

## 2011-02-13 ENCOUNTER — Emergency Department (HOSPITAL_COMMUNITY): Payer: Medicare Other

## 2011-02-13 ENCOUNTER — Other Ambulatory Visit: Payer: Self-pay

## 2011-02-13 DIAGNOSIS — I10 Essential (primary) hypertension: Secondary | ICD-10-CM | POA: Insufficient documentation

## 2011-02-13 DIAGNOSIS — Z87891 Personal history of nicotine dependence: Secondary | ICD-10-CM | POA: Insufficient documentation

## 2011-02-13 DIAGNOSIS — K5730 Diverticulosis of large intestine without perforation or abscess without bleeding: Secondary | ICD-10-CM | POA: Insufficient documentation

## 2011-02-13 DIAGNOSIS — G319 Degenerative disease of nervous system, unspecified: Secondary | ICD-10-CM | POA: Insufficient documentation

## 2011-02-13 DIAGNOSIS — S300XXA Contusion of lower back and pelvis, initial encounter: Secondary | ICD-10-CM

## 2011-02-13 DIAGNOSIS — W108XXA Fall (on) (from) other stairs and steps, initial encounter: Secondary | ICD-10-CM | POA: Insufficient documentation

## 2011-02-13 DIAGNOSIS — K219 Gastro-esophageal reflux disease without esophagitis: Secondary | ICD-10-CM | POA: Insufficient documentation

## 2011-02-13 DIAGNOSIS — S20229A Contusion of unspecified back wall of thorax, initial encounter: Secondary | ICD-10-CM | POA: Insufficient documentation

## 2011-02-13 DIAGNOSIS — W19XXXA Unspecified fall, initial encounter: Secondary | ICD-10-CM

## 2011-02-13 DIAGNOSIS — Y92009 Unspecified place in unspecified non-institutional (private) residence as the place of occurrence of the external cause: Secondary | ICD-10-CM | POA: Insufficient documentation

## 2011-02-13 LAB — CBC
Hemoglobin: 10.4 g/dL — ABNORMAL LOW (ref 12.0–15.0)
MCHC: 31.3 g/dL (ref 30.0–36.0)
Platelets: 109 10*3/uL — ABNORMAL LOW (ref 150–400)
RBC: 3.96 MIL/uL (ref 3.87–5.11)

## 2011-02-13 LAB — URINALYSIS, ROUTINE W REFLEX MICROSCOPIC
Hgb urine dipstick: NEGATIVE
Leukocytes, UA: NEGATIVE
Protein, ur: NEGATIVE mg/dL
Specific Gravity, Urine: 1.02 (ref 1.005–1.030)
Urobilinogen, UA: 0.2 mg/dL (ref 0.0–1.0)

## 2011-02-13 LAB — BASIC METABOLIC PANEL
CO2: 26 mEq/L (ref 19–32)
GFR calc non Af Amer: >60 mL/min (ref >60)
Glucose, Bld: 110 mg/dL — ABNORMAL HIGH (ref 70–99)
Potassium: 4 mEq/L (ref 3.5–5.1)
Sodium: 137 mEq/L (ref 135–145)

## 2011-02-13 LAB — CARDIAC PANEL(CRET KIN+CKTOT+MB+TROPI)
CK, MB: 2.4 ng/mL (ref 0.3–4.0)
Total CK: 50 U/L (ref 7–177)

## 2011-02-13 MED ORDER — IBUPROFEN 600 MG PO TABS: 600.0000 mg | ORAL_TABLET | Freq: Four times a day (QID) | ORAL | Status: AC | PRN

## 2011-02-13 NOTE — ED Notes (Signed)
Backboard removed from pt by [email or web address]/, c-collar remains on place, warm blankets given.

## 2011-02-13 NOTE — ED Notes (Signed)
Care assumed, pt is on backboard and c-collar, only complaint is back apin--from board, doesnot remember falling.  Per previous report, pt uses 2 canes to walk and fell backward from 2nd step entering home.

## 2011-02-13 NOTE — ED Notes (Signed)
Per ems, pt was walking up the stairs to her house and fell straight backwards.  Pt does not remember her fall.  Pt is a&o x 3.  Family wasn't sure if she hit her head.  Pt c/o pain to her lower back.

## 2011-02-13 NOTE — ED Notes (Signed)
Pt pupils were sluggish to react

## 2011-02-13 NOTE — ED Provider Notes (Addendum)
History     CSN: [id number] Arrival date & time: 02/13/2011  7:01 PM  Chief Complaint  Patient presents with  . Fall   HPI Comments: She states she is unsure why she fell she does not remember the event and has no other complaints year before or after the fall.  Patient is a 77 y.o. female presenting with fall. The history is provided by the patient and the EMS personnel. History Limited By: No memory of the fall.  Fall The accident occurred less than 1 hour ago. Incident: While walking upstairs. Distance fallen: From second stair backwards. Impact surface: Concrete. There was no blood loss. Point of impact: Lower back. Pain location: Lower back. The pain is moderate. She was not ambulatory at the scene. There was no entrapment after the fall. There was no drug use involved in the accident. There was no alcohol use involved in the accident. Associated symptoms include headaches. Pertinent negatives include no visual change, no fever, no numbness, no abdominal pain, no bowel incontinence, no nausea, no vomiting, no hearing loss and no tingling. Loss of consciousness: Unknown. Exacerbated by: Immobilization by EMS on a backboard and cervical collar. Treatment on scene includes a backboard and a c-collar.    Past Medical History  Diagnosis Date  . GERD (gastroesophageal reflux disease)   . Complete rupture of rotator cuff   . Pain in joint, shoulder region   . Chronic kidney disease, stage I   . Iron deficiency anemia, unspecified   . Bereavement, uncomplicated   . Edema   . Hyperglycemia   . Unspecified constipation   . Osteoporosis, unspecified   . OA (osteoarthritis)   . Unspecified essential hypertension   . Other and unspecified hyperlipidemia   . Diverticulosis of colon (without mention of hemorrhage)     Past Surgical History  Procedure Date  . Abdominal hysterectomy   . Benign cyst removal from lateral chest     Family History  Problem Relation Age of Onset  . Stroke  Father   . Dementia Mother   . Dementia Sister   . Cancer Brother     throat   . Nephrolithiasis Brother     History  Substance Use Topics  . Smoking status: Former Games developer  . Smokeless tobacco: Not on file  . Alcohol Use: No    OB History    Grav Para Term Preterm Abortions TAB SAB Ect Mult Living                  Review of Systems  Unable to perform ROS Constitutional: Negative for fever.  Gastrointestinal: Negative for nausea, vomiting, abdominal pain and bowel incontinence.  Neurological: Positive for headaches. Negative for tingling and numbness. Loss of consciousness: Unknown.    Physical Exam  BP 167/73  Pulse 70  Temp(Src) 98.7 F (37.1 C) (Oral)  Resp 20  Ht 5\' 4"  (1.626 m)  Wt 130 lb (58.968 kg)  BMI 22.31 kg/m2  SpO2 96%  Physical Exam  Nursing note and vitals reviewed. Constitutional: She appears well-developed and well-nourished. No distress.  HENT:  Head: Normocephalic and atraumatic.  Mouth/Throat: Oropharynx is clear and moist. No oropharyngeal exudate.  Eyes: Conjunctivae and EOM are normal. Pupils are equal, round, and reactive to light. Right eye exhibits no discharge. Left eye exhibits no discharge. No scleral icterus.  Neck: Normal range of motion. Neck supple. No JVD present. No thyromegaly present.  Cardiovascular: Normal rate, regular rhythm, normal heart sounds and intact distal pulses.  Exam reveals no gallop and no friction rub.   No murmur heard. Pulmonary/Chest: Effort normal and breath sounds normal. No respiratory distress. She has no wheezes. She has no rales.  Abdominal: Soft. Bowel sounds are normal. She exhibits no distension and no mass. There is no tenderness.  Musculoskeletal: Normal range of motion. She exhibits tenderness (mild tenderness in the lumbar spine. No tenderness in the cervical or thoracic spines. Normal range of motion of both the upper and lower extremities bilaterally without pain.). She exhibits no edema.    Lymphadenopathy:    She has no cervical adenopathy.  Neurological: She is alert. Coordination normal.  Skin: Skin is warm and dry. No rash noted. No erythema.  Psychiatric: She has a normal mood and affect. Her behavior is normal.    ED Course  Procedures  MDM Unsure of the reason for the fall, it initiated evaluation with EKG and imaging also blood work.   Pt now doing much better - family is here and states that they saw her fall when she lost her balance on the second step - fell to her buttocks.  Imaging reveals no acute fractures and she is neuro intact - will ambulate.  Results for orders placed during the hospital encounter of 02/13/11  CBC      Component Value Range   WBC 8.2  4.0 - 10.5 (K/uL)   RBC 3.96  3.87 - 5.11 (MIL/uL)   Hemoglobin 10.4 (*) 12.0 - 15.0 (g/dL)   HCT 16.1 (*) 09.6 - 46.0 (%)   MCV 83.8  78.0 - 100.0 (fL)   MCH 26.3  26.0 - 34.0 (pg)   MCHC 31.3  30.0 - 36.0 (g/dL)   RDW 04.5 (*) 40.9 - 15.5 (%)   Platelets 109 (*) 150 - 400 (K/uL)  BASIC METABOLIC PANEL      Component Value Range   Sodium 137  135 - 145 (mEq/L)   Potassium 4.0  3.5 - 5.1 (mEq/L)   Chloride 101  96 - 112 (mEq/L)   CO2 26  19 - 32 (mEq/L)   Glucose, Bld 110 (*) 70 - 99 (mg/dL)   BUN 24 (*) 6 - 23 (mg/dL)   Creatinine, Ser 8.11  0.50 - 1.10 (mg/dL)   Calcium 8.9  8.4 - 91.4 (mg/dL)   GFR calc non Af Amer >60  >60 (mL/min)   GFR calc Af Amer >60  >60 (mL/min)  URINALYSIS, ROUTINE W REFLEX MICROSCOPIC      Component Value Range   Color, Urine YELLOW  YELLOW    Appearance CLEAR  CLEAR    Specific Gravity, Urine 1.020  1.005 - 1.030    pH 7.0  5.0 - 8.0    Glucose, UA NEGATIVE  NEGATIVE (mg/dL)   Hgb urine dipstick NEGATIVE  NEGATIVE    Bilirubin Urine NEGATIVE  NEGATIVE    Ketones, ur NEGATIVE  NEGATIVE (mg/dL)   Protein, ur NEGATIVE  NEGATIVE (mg/dL)   Urobilinogen, UA 0.2  0.0 - 1.0 (mg/dL)   Nitrite NEGATIVE  NEGATIVE    Leukocytes, UA NEGATIVE  NEGATIVE   CARDIAC  PANEL(CRET KIN+CKTOT+MB+TROPI)      Component Value Range   Total CK 50  7 - 177 (U/L)   CK, MB 2.4  0.3 - 4.0 (ng/mL)   Troponin I <0.30  <0.30 (ng/mL)   Relative Index RELATIVE INDEX IS INVALID  0.0 - 2.5    Dg Thoracic Spine W/swimmers  02/13/2011  *RADIOLOGY REPORT*  Clinical Data: Fall today  with upper and lower back pain.  THORACIC SPINE - 2 VIEW + SWIMMERS  Comparison: Cervical spine CT of same date.  No prior thoracic spine imaging.  Findings: Moderate osteopenia.  Mild convex right thoracolumbar spine curvature.  The lateral view images from approximately T3-L2. No gross vertebral body height loss.  The extent of osteopenia degrades evaluation for subtle endplate abnormalities.  IMPRESSION:  1.  Moderate osteopenia.  This degrades evaluation. 2.  Given this factor, no gross abnormality from T3 inferiorly.  T1 and T2 are imaged on the cervical spine CT.  Original Report Authenticated By: Consuello Bossier, M.D.   Dg Lumbar Spine Complete  02/13/2011  *RADIOLOGY REPORT*  Clinical Data: Fall today.  Upper and lower back pain.  LUMBAR SPINE - COMPLETE 4+ VIEW  Comparison: Abdominal pelvic CT of 04/16/2010.  Findings: Moderate osteopenia.  Five lumbar vertebral bodies.  AP view degraded secondary to overlying stool. Severe left hip osteoarthritis.  Dense aortic atherosclerosis.  Vertebral body height is grossly maintained.  Given the extent of osteopenia, subtle endplate abnormalities could be obscured. Degenerative disc disease at the lumbosacral junction.  IMPRESSION: Degraded exam, as detailed above.  Osteopenia without convincing evidence of acute injury.  Severe left hip osteoarthritis.  Original Report Authenticated By: Consuello Bossier, M.D.   Ct Head Wo Contrast  02/13/2011  *RADIOLOGY REPORT*  Clinical Data:  Fall  CT HEAD WITHOUT CONTRAST CT CERVICAL SPINE WITHOUT CONTRAST  Technique:  Multidetector CT imaging of the head and cervical spine was performed following the standard protocol without  intravenous contrast.  Multiplanar CT image reconstructions of the cervical spine were also generated.  Comparison:  05/07/2004  CT HEAD  Findings: No skull fracture is noted.  No intracranial hemorrhage, mass effect or midline shift.  Stable cerebral atrophy.  Stable periventricular chronic white matter disease.  No acute infarction. No mass lesion is noted on this unenhanced scan.  Paranasal sinuses and mastoid air cells are unremarkable.  IMPRESSION: No acute intracranial abnormality.  Stable atrophy and chronic white matter disease  CT CERVICAL SPINE  Findings: Axial images of the cervical spine shows no acute fracture or subluxation.  Computer processed images shows no acute fracture or subluxation.  There is diffuse osteopenia.  Multilevel disc space flattening and mild anterior spurring noted.  No prevertebral soft tissue swelling.  Cervical airway is patent.  IMPRESSION: No acute fracture or subluxation.  Diffuse osteopenia.  Multilevel degenerative changes.  Original Report Authenticated By: Natasha Mead, M.D.   Ct Cervical Spine Wo Contrast  02/13/2011  *RADIOLOGY REPORT*  Clinical Data:  Fall  CT HEAD WITHOUT CONTRAST CT CERVICAL SPINE WITHOUT CONTRAST  Technique:  Multidetector CT imaging of the head and cervical spine was performed following the standard protocol without intravenous contrast.  Multiplanar CT image reconstructions of the cervical spine were also generated.  Comparison:  05/07/2004  CT HEAD  Findings: No skull fracture is noted.  No intracranial hemorrhage, mass effect or midline shift.  Stable cerebral atrophy.  Stable periventricular chronic white matter disease.  No acute infarction. No mass lesion is noted on this unenhanced scan.  Paranasal sinuses and mastoid air cells are unremarkable.  IMPRESSION: No acute intracranial abnormality.  Stable atrophy and chronic white matter disease  CT CERVICAL SPINE  Findings: Axial images of the cervical spine shows no acute fracture or  subluxation.  Computer processed images shows no acute fracture or subluxation.  There is diffuse osteopenia.  Multilevel disc space flattening and mild anterior  spurring noted.  No prevertebral soft tissue swelling.  Cervical airway is patent.  IMPRESSION: No acute fracture or subluxation.  Diffuse osteopenia.  Multilevel degenerative changes.  Original Report Authenticated By: Natasha Mead, M.D.    I do not see any other reason for her to have fallen given her workup. She has been able to ambulate in the emergency department without any difficulty with some assistance. Of note she does use 2 canes to walk at home. We'll discharge him  Vida Roller, MD 02/13/11 2222  ED ECG REPORT   Date: 02/13/2011 20:20   Rate: 66  Rhythm: normal sinus rhythm  QRS Axis: normal  Intervals: normal  ST/T Wave abnormalities: nonspecific T wave changes  Conduction Disutrbances:none  Narrative Interpretation: mild LVH criteria  Old EKG Reviewed: none available   Vida Roller, MD 02/13/11 2227

## 2011-02-13 NOTE — ED Notes (Signed)
Pt repositioned for comfort, family at bedside, warm blanket given.

## 2011-02-13 NOTE — ED Notes (Signed)
Ambulated patient from bed to nurses station and back with no problems.

## 2011-02-23 ENCOUNTER — Encounter: Payer: Self-pay | Admitting: Family Medicine

## 2011-02-23 ENCOUNTER — Ambulatory Visit: Payer: Medicare Other | Admitting: Family Medicine

## 2011-02-27 ENCOUNTER — Encounter: Payer: Self-pay | Admitting: Family Medicine

## 2011-02-27 ENCOUNTER — Ambulatory Visit (INDEPENDENT_AMBULATORY_CARE_PROVIDER_SITE_OTHER): Payer: Medicare Other | Admitting: Family Medicine

## 2011-02-27 VITALS — BP 168/70 | HR 60 | Resp 16 | Wt 102.0 lb

## 2011-02-27 DIAGNOSIS — R2681 Unsteadiness on feet: Secondary | ICD-10-CM

## 2011-02-27 DIAGNOSIS — E785 Hyperlipidemia, unspecified: Secondary | ICD-10-CM

## 2011-02-27 DIAGNOSIS — M25559 Pain in unspecified hip: Secondary | ICD-10-CM

## 2011-02-27 DIAGNOSIS — I10 Essential (primary) hypertension: Secondary | ICD-10-CM

## 2011-02-27 DIAGNOSIS — M1990 Unspecified osteoarthritis, unspecified site: Secondary | ICD-10-CM

## 2011-02-27 DIAGNOSIS — R269 Unspecified abnormalities of gait and mobility: Secondary | ICD-10-CM

## 2011-02-27 MED ORDER — PRAVASTATIN SODIUM 20 MG PO TABS: ORAL_TABLET | ORAL | Status: DC

## 2011-02-27 MED ORDER — AMLODIPINE BESYLATE 10 MG PO TABS: 10.0000 mg | ORAL_TABLET | Freq: Every day | ORAL | Status: DC

## 2011-02-27 MED ORDER — TRAMADOL HCL 50 MG PO TABS: 50.0000 mg | ORAL_TABLET | Freq: Two times a day (BID) | ORAL | Status: DC | PRN

## 2011-02-27 MED ORDER — BENAZEPRIL HCL 20 MG PO TABS: 20.0000 mg | ORAL_TABLET | Freq: Every day | ORAL | Status: DC

## 2011-02-27 NOTE — Assessment & Plan Note (Signed)
Discuss with patient her blood pressure medications. Refills were placed and she will have these sent to her home. We'll recheck blood pressure in 4 weeks

## 2011-02-27 NOTE — Assessment & Plan Note (Signed)
Refill pain medication

## 2011-02-27 NOTE — Patient Instructions (Signed)
I have refilled your medications Check at home for the eye drop bottle I will get records from your other doctors I have given you your flu shot today Next visit in 4 weeks

## 2011-02-27 NOTE — Assessment & Plan Note (Signed)
Obtain wheelchair, advised patient to use walker instead of 2 canes

## 2011-02-27 NOTE — Assessment & Plan Note (Signed)
At this time we'll continue statin. I will obtain records from her previous PCP. She is 77 years old and may not have any significant reduction from continued statin over the next 5 years

## 2011-02-27 NOTE — Progress Notes (Signed)
  Subjective:    Patient ID: [name] [name], female    DOB: 11/10/1920, 77 y.o.   MRN: [id number]  HPI  Previous Pt Dr. Margo Aye, Here to establish Care, medications and history reviewed   s/p Fall 2 weeks ago, no fracture, still has some discomoft in her left hip , she also landed on left arm and has a bruise still present. She fractures her pelvis after a fall in July, 2012    Has both a cane and walker at home, but does not use on a regular basis, would like wheelchair  (small)   HTN- history of HTN, does not take medications on a regular basis, she has some half filled bottles and other empty bottles, no BP readings at home.   Hyperlipidemia- out of statin  Arthritis-has OA in shoulder and bilateral knees, follows with Dr. Romeo Apple (Ortho)- needs refill on Ultram   Medciation Rec- she has many meds on her list, she does not take most of them, she weary as she does not what her meds are for and they do not say it on the bottle.  A family friend was present today- she asked about getting an Aide to be with her throughout the day as the family can only be there at night- I advised I can get HH involved for medications, she needs to bring a family member to the next visit.   No history of diabetes or HTN, MI, STroke or Cancer  Review of Systems GEN- denies fatigue, fever, weight loss,weakness, recent illness CVS- denies chest pain, palpitations RESP- denies SOB, cough, wheeze ABD- denies N/V, change in stools, abd pain GU- denies dysuria, hematuria, dribbling, +incontinence MSK-+ joint pain, muscle aches, injury Neuro- denies headache, dizziness, syncope, seizure activity       Objective:   Physical Exam GEN- NAD, alert and oriented x3, elderly, thin, sitting in wheelchair HEENT- PERRL, EOMI, arucus senilis ,decreased vision, MMM, oropharynx clear Neck- Supple, no thryomegaly CVS- RRR, no murmur RESP-CTAB EXT- Trace pedal edema HIP- pain with IR of left Hip, able to flex at  hip without pain, stands but with a lot of effort, uses 2 canes       Mild TTP lumbar spine region      Small   Nodule like lesions felt beneath skin on left buttock          Pain with manipulation of left knee      No pain with IR/ER right Hip SKin- bruising on left arm Pulses- Radial, DP- 2+        Assessment & Plan:

## 2011-02-27 NOTE — Assessment & Plan Note (Signed)
Secondary to recent fall. X-rays reviewed there was no fracture. Regarding the small nodule we'll watch this at this time is likely a cystic lesion

## 2011-03-06 ENCOUNTER — Telehealth: Payer: Self-pay | Admitting: Family Medicine

## 2011-03-06 NOTE — Telephone Encounter (Signed)
Patient aware.

## 2011-03-06 NOTE — Telephone Encounter (Signed)
I do not think the flu shot is the cause of back pain. If she has redness on her arm or fever, then she needs to be seen. She can take her ultram and rest

## 2011-03-06 NOTE — Telephone Encounter (Signed)
Called patient, left message.

## 2011-03-26 ENCOUNTER — Encounter: Payer: Self-pay | Admitting: Family Medicine

## 2011-03-27 ENCOUNTER — Encounter: Payer: Self-pay | Admitting: Family Medicine

## 2011-03-27 ENCOUNTER — Ambulatory Visit (HOSPITAL_COMMUNITY)
Admission: RE | Admit: 2011-03-27 | Discharge: 2011-03-27 | Disposition: A | Discharge status: Home / Self Care | Payer: Medicare Other | Source: Ambulatory Visit | Attending: Family Medicine | Admitting: Family Medicine

## 2011-03-27 ENCOUNTER — Ambulatory Visit (INDEPENDENT_AMBULATORY_CARE_PROVIDER_SITE_OTHER): Payer: Medicare Other | Admitting: Family Medicine

## 2011-03-27 VITALS — BP 100/50 | Resp 16 | Ht 61.0 in

## 2011-03-27 DIAGNOSIS — M25559 Pain in unspecified hip: Secondary | ICD-10-CM

## 2011-03-27 DIAGNOSIS — M1990 Unspecified osteoarthritis, unspecified site: Secondary | ICD-10-CM

## 2011-03-27 DIAGNOSIS — I10 Essential (primary) hypertension: Secondary | ICD-10-CM

## 2011-03-27 DIAGNOSIS — M247 Protrusio acetabuli: Secondary | ICD-10-CM | POA: Insufficient documentation

## 2011-03-27 DIAGNOSIS — W19XXXA Unspecified fall, initial encounter: Secondary | ICD-10-CM

## 2011-03-27 NOTE — Patient Instructions (Addendum)
Please get your X-ray of your Hip I will set you up to see Dr. Romeo Apple for your HIP Continue your current medications If you fall again please call me/also push the alert button Please call me with the eye drops so I can refill them  Continue the ultram I will set you up for a home health nurse  Please schedule a f/u with Dr. Lodema Hong in 2 months

## 2011-03-27 NOTE — Progress Notes (Signed)
  Subjective:    Patient ID: [name] [name], female    DOB: 1921/04/03, 77 y.o.   MRN: [id number]  HPI  Recurrent fall- last week pt had a fall, she does not remember any details, thinks she passed out, her legs slipped from beneath her, she had her walker with her, police arrived, she was given a life alert. She is alone at home, a family friend takes her to all appt and sits with her during the day, helps make some meals for her. None of her family is helping at other times. Does not remember hitting her head, no headache, no change in mentation or speech, states family friend. 3rd fall in 5 months  Hip pain- persistent left hip , still feels nodule , pain about the same since fall, denies any low back pain today  HTN- blood pressure much improved, states she has no difficulty with meds, overall feels well   No family member able to come to visit today  [phone]- [name] [name] - pt sister   Review of Systems    GEN- denies fatigue, fever, weight loss,weakness, recent illness CVS- denies chest pain, palpitations RESP- denies SOB, cough, wheeze MSK-+joint pain, muscle aches, injury Neuro- denies headache, occ dizziness, ?syncope, seizure activity      Objective:   Physical Exam GEN- NAD, alert and oriented x3, elderly, thin, sitting in wheelchair CVS- RRR, no murmur RESP-CTAB EXT- Trace pedal edema HIP- pain with IR of left Hip, able to flex at hip without pain, stands but with a lot of effort,        Lumbar spine non-tender      Small   Nodule like lesions felt beneath skin on left buttock          Pain with manipulation of left knee      No pain with IR/ER right Hip SKin- intact Pulses- Radial, DP- 2+ Neuro- mentation in tact, memory- fair,, speech normal       Assessment & Plan:

## 2011-03-29 DIAGNOSIS — W19XXXA Unspecified fall, initial encounter: Secondary | ICD-10-CM | POA: Insufficient documentation

## 2011-03-29 NOTE — Assessment & Plan Note (Signed)
Previous BP quite elevated, today, low normotensive, will monitor,avoid hypotension which can also contribute to her falls Encouraged routine meals

## 2011-03-29 NOTE — Assessment & Plan Note (Signed)
Will obtain image for hip. Refer back to ortho, pt had pelvic fracture this past summer. Ultram for pain Based on her current state, I doubt she would tolerate surgery/rehhab very well, but I would like any recommendations from Ortho

## 2011-03-29 NOTE — Assessment & Plan Note (Signed)
Recurrent falls, I discussed ALF or SNF with pt, she does not want to leave home , no families members available, she needs 24 hour care. I will set her up for her with Home health. I will attempt to contact some family members. She makes her own decisions at this point

## 2011-03-29 NOTE — Assessment & Plan Note (Signed)
Limits mobility, obtain HH aide

## 2011-04-01 ENCOUNTER — Telehealth: Payer: Self-pay | Admitting: Family Medicine

## 2011-04-01 NOTE — Telephone Encounter (Signed)
I spoke with [name] [name], pt sister, she is  Ill herself, she does not know of anyone who can stay with [name] [name], they have not discussed placement in an ALF or SNF. She had no questions or other thoughts, I let her know I am trying to get her an Aide, but I do not know how quickly.   I tried to call [name] [name] there was no answer, she does not have a long term eye drop on record at the pharmacy. The previous one was just because of her eye infection

## 2011-04-06 ENCOUNTER — Encounter (HOSPITAL_COMMUNITY): Payer: Medicare Other | Attending: Oncology | Admitting: Oncology

## 2011-04-09 ENCOUNTER — Ambulatory Visit (INDEPENDENT_AMBULATORY_CARE_PROVIDER_SITE_OTHER): Payer: Medicare Other | Admitting: Orthopedic Surgery

## 2011-04-09 ENCOUNTER — Encounter: Payer: Self-pay | Admitting: Orthopedic Surgery

## 2011-04-09 ENCOUNTER — Telehealth: Payer: Self-pay | Admitting: Family Medicine

## 2011-04-09 DIAGNOSIS — M5430 Sciatica, unspecified side: Secondary | ICD-10-CM | POA: Insufficient documentation

## 2011-04-09 MED ORDER — METHYLPREDNISOLONE ACETATE 40 MG/ML IJ SUSP: 40.0000 mg | Freq: Once | INTRAMUSCULAR | Status: DC

## 2011-04-09 NOTE — Progress Notes (Signed)
Pain LEFT leg.  Pain runs from the LEFT hip. The LEFT knee.  Status post pelvic fracture.  Review of systems weight loss.  Exam  she has tenderness in the LEFT lumbar spine, LEFT greater trochanter, LEFT leg.  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  Injection LEFT hip IM.  Left Hip Injection Verbal consent was obtained  Timeout procedure was completed  Alcohol was used as a prep with ethyl chloride as an anesthetizing agent  Using a 20-gauge needle, and intramuscular shots was given with the following medications, Depo-Medrol 40 mg.  And 3 cc of 1% lidocaine   Tolerated with no complications

## 2011-04-09 NOTE — Patient Instructions (Signed)
You have received a steroid shot. 15% of patients experience increased pain at the injection site with in the next 24 hours. This is best treated with ice and tylenol extra strength 2 tabs every 8 hours. If you are still having pain please call the office.    

## 2011-04-10 NOTE — Telephone Encounter (Signed)
This was done.

## 2011-04-23 DIAGNOSIS — M069 Rheumatoid arthritis, unspecified: Secondary | ICD-10-CM

## 2011-04-23 DIAGNOSIS — R32 Unspecified urinary incontinence: Secondary | ICD-10-CM

## 2011-04-23 DIAGNOSIS — M6281 Muscle weakness (generalized): Secondary | ICD-10-CM

## 2011-05-20 ENCOUNTER — Encounter: Payer: Self-pay | Admitting: Family Medicine

## 2011-05-21 ENCOUNTER — Encounter: Payer: Self-pay | Admitting: Family Medicine

## 2011-05-25 ENCOUNTER — Encounter: Payer: Self-pay | Admitting: Family Medicine

## 2011-05-25 ENCOUNTER — Ambulatory Visit (INDEPENDENT_AMBULATORY_CARE_PROVIDER_SITE_OTHER): Payer: Medicare Other | Admitting: Family Medicine

## 2011-05-25 VITALS — BP 126/70 | HR 56 | Resp 15 | Ht 61.0 in | Wt 107.1 lb

## 2011-05-25 DIAGNOSIS — I10 Essential (primary) hypertension: Secondary | ICD-10-CM

## 2011-05-25 DIAGNOSIS — M1990 Unspecified osteoarthritis, unspecified site: Secondary | ICD-10-CM

## 2011-05-25 DIAGNOSIS — E785 Hyperlipidemia, unspecified: Secondary | ICD-10-CM

## 2011-05-25 DIAGNOSIS — Z23 Encounter for immunization: Secondary | ICD-10-CM

## 2011-05-25 MED ORDER — PRAVASTATIN SODIUM 20 MG PO TABS: ORAL_TABLET | ORAL | Status: DC

## 2011-05-25 MED ORDER — DICLOFENAC SODIUM 1 % TD GEL: TRANSDERMAL | Status: DC

## 2011-05-25 MED ORDER — BENAZEPRIL HCL 20 MG PO TABS: 20.0000 mg | ORAL_TABLET | Freq: Every day | ORAL | Status: DC

## 2011-05-25 MED ORDER — KETOROLAC TROMETHAMINE 60 MG/2ML IJ SOLN
30.0000 mg | Freq: Once | INTRAMUSCULAR | Status: AC
  Administered 2011-05-25: 30 mg via INTRAMUSCULAR

## 2011-05-25 MED ORDER — PREDNISONE 5 MG PO TABS: 5.0000 mg | ORAL_TABLET | Freq: Two times a day (BID) | ORAL | Status: AC

## 2011-05-25 MED ORDER — AMLODIPINE BESYLATE 10 MG PO TABS: 10.0000 mg | ORAL_TABLET | Freq: Every day | ORAL | Status: DC

## 2011-05-25 MED ORDER — ESOMEPRAZOLE MAGNESIUM 20 MG PO CPDR: 20.0000 mg | DELAYED_RELEASE_CAPSULE | Freq: Every day | ORAL | Status: DC

## 2011-05-25 MED ORDER — TRAMADOL HCL 50 MG PO TABS: 50.0000 mg | ORAL_TABLET | Freq: Two times a day (BID) | ORAL | Status: DC | PRN

## 2011-05-25 NOTE — Assessment & Plan Note (Signed)
Flare with uncontrolled pain, toradol in office, prednisone 5mg  twice daily for 5 days , and voltaren gel as needed, will d/c ibuprofen

## 2011-05-25 NOTE — Progress Notes (Signed)
  Subjective:    Patient ID: [name] [name], female    DOB: Feb 12, 1921, 77 y.o.   MRN: [id number]  HPI The PT is here for follow up and re-evaluation of chronic medical conditions, medication management and review of any available recent lab and radiology data.  Preventive health is updated, specifically   Immunization.   The PT denies any adverse reactions to current medications since the last visit.  C/o increased and uncontrolled arthritic pain and requests medication for this, denies any falls    Review of Systems See HPI Denies recent fever or chills. Denies sinus pressure, nasal congestion, ear pain or sore throat. Denies chest congestion, productive cough or wheezing. Denies chest pains, palpitations and leg swelling Denies abdominal pain, nausea, vomiting,diarrhea or constipation.   Denies dysuria, frequency, hesitancy or incontinence. Denies skin break down or rash.        Objective:   Physical Exam Patient alert and oriented and in no cardiopulmonary distress.  HEENT: No facial asymmetry, EOMI, no sinus tenderness,  oropharynx pink and moist.  Neckdecreased ROM no adenopathy.  Chest: Clear to auscultation bilaterally.  CVS: S1, S2 no murmurs, no S3.  ABD: Soft non tender. Bowel sounds normal.  Ext: No edema  [id number]:MVHQIONGE ROM spine, shoulders, hips and knees.  Skin: Intact,  CNS: CN 2-12 intact, power,  normal throughout.        Assessment & Plan:

## 2011-05-25 NOTE — Patient Instructions (Addendum)
F/u end Feb or early March with Dr Jeanice Lim  Toradol 30mg  iM today in office for joint pains.  Please stop ibuprofen, instead you have a gel to be rubbed on the joints and muscles which hurt, voltaren  Prednisone is also prescribed for 5 days only, also a tablet to take to protect your stomach from being irritated, nexium  TdAP today

## 2011-05-27 ENCOUNTER — Ambulatory Visit: Payer: Medicare Other | Admitting: Family Medicine

## 2011-05-27 NOTE — Assessment & Plan Note (Signed)
Controlled, no change in medication  

## 2011-06-06 ENCOUNTER — Other Ambulatory Visit: Payer: Self-pay | Admitting: Family Medicine

## 2011-06-17 ENCOUNTER — Telehealth: Payer: Self-pay | Admitting: Family Medicine

## 2011-06-17 NOTE — Telephone Encounter (Signed)
Line busy x 2

## 2011-06-18 NOTE — Telephone Encounter (Signed)
Next day and phone is still busy.

## 2011-07-14 ENCOUNTER — Ambulatory Visit: Payer: Medicare Other | Admitting: Family Medicine

## 2011-07-17 ENCOUNTER — Ambulatory Visit: Payer: Medicare Other | Admitting: Family Medicine

## 2011-07-21 ENCOUNTER — Telehealth: Payer: Self-pay | Admitting: *Deleted

## 2011-07-21 NOTE — Telephone Encounter (Signed)
Opened in error

## 2011-07-22 ENCOUNTER — Ambulatory Visit: Payer: Medicare Other | Admitting: Orthopedic Surgery

## 2011-07-22 ENCOUNTER — Ambulatory Visit (INDEPENDENT_AMBULATORY_CARE_PROVIDER_SITE_OTHER): Payer: Medicare Other | Admitting: Orthopedic Surgery

## 2011-07-22 ENCOUNTER — Encounter: Payer: Self-pay | Admitting: Orthopedic Surgery

## 2011-07-22 DIAGNOSIS — M25559 Pain in unspecified hip: Secondary | ICD-10-CM

## 2011-07-22 DIAGNOSIS — M5430 Sciatica, unspecified side: Secondary | ICD-10-CM

## 2011-07-22 NOTE — Progress Notes (Signed)
Patient ID: [name] [name], female   DOB: Jun 22, 1920, 78 y.o.   MRN: [id number]    Injection requested for LEFT leg pain.  Patient with chronic LEFT lower extremity radicular pain. Based on her age. She is not a surgical candidate.  She requests injection IM. LEFT hip.  Injection given.

## 2011-07-22 NOTE — Patient Instructions (Signed)
You have received a steroid shot. 15% of patients experience increased pain at the injection site with in the next 24 hours. This is best treated with ice and tylenol extra strength 2 tabs every 8 hours. If you are still having pain please call the office.    

## 2011-08-03 ENCOUNTER — Encounter: Payer: Self-pay | Admitting: Family Medicine

## 2011-08-03 ENCOUNTER — Ambulatory Visit: Payer: Medicare Other | Admitting: Family Medicine

## 2011-08-17 ENCOUNTER — Ambulatory Visit (INDEPENDENT_AMBULATORY_CARE_PROVIDER_SITE_OTHER): Payer: Medicare Other | Admitting: Family Medicine

## 2011-08-17 ENCOUNTER — Encounter: Payer: Self-pay | Admitting: Family Medicine

## 2011-08-17 VITALS — BP 140/70 | HR 56 | Resp 15 | Ht 61.0 in | Wt 104.0 lb

## 2011-08-17 DIAGNOSIS — M1990 Unspecified osteoarthritis, unspecified site: Secondary | ICD-10-CM

## 2011-08-17 DIAGNOSIS — I10 Essential (primary) hypertension: Secondary | ICD-10-CM

## 2011-08-17 DIAGNOSIS — M069 Rheumatoid arthritis, unspecified: Secondary | ICD-10-CM

## 2011-08-17 DIAGNOSIS — R531 Weakness: Secondary | ICD-10-CM

## 2011-08-17 DIAGNOSIS — R269 Unspecified abnormalities of gait and mobility: Secondary | ICD-10-CM

## 2011-08-17 DIAGNOSIS — R2681 Unsteadiness on feet: Secondary | ICD-10-CM

## 2011-08-17 DIAGNOSIS — R5381 Other malaise: Secondary | ICD-10-CM

## 2011-08-17 DIAGNOSIS — E785 Hyperlipidemia, unspecified: Secondary | ICD-10-CM

## 2011-08-17 DIAGNOSIS — N181 Chronic kidney disease, stage 1: Secondary | ICD-10-CM

## 2011-08-17 MED ORDER — TRAMADOL HCL 50 MG PO TABS: 50.0000 mg | ORAL_TABLET | Freq: Every day | ORAL | Status: DC

## 2011-08-17 NOTE — Patient Instructions (Signed)
For your blood pressure take the amlodipine once a day For your pain- take 1 pain pill daily  You can stop the other medications I will call the veterans benefits line I will send the scooter form in  F/U in 1 month

## 2011-08-18 ENCOUNTER — Encounter: Payer: Self-pay | Admitting: Family Medicine

## 2011-08-18 DIAGNOSIS — R531 Weakness: Secondary | ICD-10-CM | POA: Insufficient documentation

## 2011-08-18 NOTE — Assessment & Plan Note (Addendum)
She is a very frail individual. She requires help with her activities of daily living. I will send for a scooter. I attempted to set her up with physical therapy however she did not follow through with this and did not want to participate I still think that she needs an assisted living facility someone that can care for her 24 hours if she is a very high fall risk and has other comorbidities. Her family is unable to provide for her financially to see this through. I will contact the [employer] on her behalf to see if her husband's benefits will help.

## 2011-08-18 NOTE — Assessment & Plan Note (Signed)
Will stop cholesterol pill patient's not taking. As she should not take the medication I do not think there is much change in her 5 year outlook from this medication

## 2011-08-18 NOTE — Progress Notes (Signed)
Subjective:    Patient ID: [name] [name], female    DOB: 24-Apr-1921, 78 y.o.   MRN: [id number]  HPI  Patient here for routine visit. She is accompanied by a friend of the family Ms. [name] [name] who comes to most visits [phone]) and her elder sister.  She has no concerns today. She recently moved in with her sisters however they have been fighting because they want to control her medications and they want her to get up and move around. Ms. [name] [name] asked about her husband's VA benefits and if this would able to help her today. The family is unable to afford an assisted living facility or nursing home and she does not be happy insurance to do so as we previously discussed.  Arthritis-she has been taking pain medication more than prescribed. Her family friend Ms. [name] [name] noticed that she was very delirious and look at her bottle and noticed that she had used up her 90 day supply within 2 months. She denies any current pain. She continues to have difficulty getting around, no recent falls but is very unstable with the use of her canes  HTN-she is only been taking her Norvasc. She does not take Lotensin  Hyperlipidemia- she does not take her cholesterol pill   Husband [name] [name] DOB 07/[birth year]   SSN [id number] VA benefits line  [phone]     Review of Systems   GEN- denies fatigue, fever, weight loss,weakness, recent illness CVS- denies chest pain, palpitations RESP- denies SOB, cough, wheeze ABD- denies N/V, change in stools, abd pain GU- denies dysuria, hematuria, dribbling, +incontinence MSK-+ joint pain, muscle aches, injury Neuro- denies headache, dizziness, syncope, seizure activity     Objective:   Physical Exam GEN- NAD, alert and oriented x3, elderly, thin, sitting in wheelchair CVS- RRR, no murmur RESP-CTAB ABD- NABS, soft, NT, ND EXT- no pedal edema HIP- pain with IR of left Hip, Decreased ROM lower ext and upper ext       Lumbar spine  non-tender               Pain with manipulation of left knee       No pain with IR/ER right Hip            Deformity of bilateral hands at the MIP and DIP joints, unable to make complete fist SKin- intact Pulses- Radial, DP- 2+ Neuro- mentation in tact, memory- fair,, speech normal Strength 3/5 bilaterally upper ext, 3/5 lower ext      Assessment & Plan:    Power Scooter- evaluation 1. patient is unable to pay her dress herself without some assistance. She is unable to stand for long periods of time. She is unable to stand in the kitchen prolonged period of time to cook and feed herself. She is unable to walk long distances. She is high risk for fall  2. patient previously had use of a walker and cane however has had falls with these. She's not stable with a cane. Her arthritis in her hip causes the most pain 8/10 at times  3. she can not optimally configure manual wheelchair secondary to her generalized weakness. She requires another individual to push her in the wheelchair because of a rolling mechanism. She is however able to use a scooter which she uses when she is taken to the grocery store.  4. she will likely require an elevated leg rests for safe transfers  5. she is not stable using her cane and I'm  concerned about increased falls the  6. per above   7. 104lbs  8. POV/scooter will be worked

## 2011-08-18 NOTE — Assessment & Plan Note (Signed)
Per above 

## 2011-08-18 NOTE — Assessment & Plan Note (Signed)
Ultram once a day for pain, pt does not complain about hand pain she is able to feed herself and hold most items

## 2011-08-18 NOTE — Assessment & Plan Note (Addendum)
Unchanged

## 2011-08-18 NOTE — Assessment & Plan Note (Signed)
Her blood pressure looks good today however she is only taking one medication. Our goal will be to not cause any hypotension. She will likely tolerate higher blood pressures with systolics 140 to 150s.

## 2011-08-18 NOTE — Assessment & Plan Note (Signed)
Steroid injections into the left hip orthopedics. I have changed her tramadol so that she may take one tablet daily. She was only given 30 tablets.

## 2011-09-07 ENCOUNTER — Other Ambulatory Visit: Payer: Self-pay | Admitting: Family Medicine

## 2011-09-07 DIAGNOSIS — Z139 Encounter for screening, unspecified: Secondary | ICD-10-CM

## 2011-09-21 ENCOUNTER — Ambulatory Visit (INDEPENDENT_AMBULATORY_CARE_PROVIDER_SITE_OTHER): Payer: Medicare Other | Admitting: Family Medicine

## 2011-09-21 ENCOUNTER — Encounter: Payer: Self-pay | Admitting: Family Medicine

## 2011-09-21 VITALS — BP 140/70 | HR 57 | Resp 16 | Ht 61.0 in | Wt 115.0 lb

## 2011-09-21 DIAGNOSIS — R635 Abnormal weight gain: Secondary | ICD-10-CM

## 2011-09-21 DIAGNOSIS — M1990 Unspecified osteoarthritis, unspecified site: Secondary | ICD-10-CM

## 2011-09-21 DIAGNOSIS — I10 Essential (primary) hypertension: Secondary | ICD-10-CM

## 2011-09-21 MED ORDER — TRAMADOL HCL 50 MG PO TABS: ORAL_TABLET | ORAL | Status: DC

## 2011-09-21 NOTE — Patient Instructions (Signed)
I will call about the scooter Continue the norvasc for blood pressure and the pain pill twice a day  F/U 3 months

## 2011-09-21 NOTE — Progress Notes (Signed)
  Subjective:    Patient ID: [name] [name], female    DOB: 23-Jun-1920, 77 y.o.   MRN: [id number]  HPI Pt here for interim visit, she is now back home, no longer staying with her sister as they did not get along. Our [facility] social worker has seen her and I am awaiting information regarding her VA benefits   OA- she is upset she ran out of pain pills, she has been taking first thing in the morning and 1 at bedtime and this controls her pain. She states they ran out in 2 weeks.   HTN- taking norvasc daily  Weight gain- good appetite, has gained 9lbs, feels good today    Review of Systems   GEN- denies fatigue, fever, weight loss,weakness, recent illness CVS- denies chest pain, palpitations RESP- denies SOB, cough, wheeze ABD- denies N/V, change in stools, abd pain GU- denies dysuria, hematuria, dribbling, +incontinence MSK-+ joint pain, muscle aches, injury Neuro- denies headache, dizziness, syncope, seizure activity     Objective:   Physical Exam GEN- NAD, alert and oriented x3, elderly, thin, sitting in wheelchair CVS- RRR, no murmur RESP-CTAB ABD- NABS, soft, NT, ND EXT- trace pedal edema Pulses- Radial, DP- 2       Assessment & Plan:

## 2011-09-21 NOTE — Assessment & Plan Note (Signed)
Pt very upset she ran out of meds, it does appear she was only taking twice a day. Will give her ultram BID

## 2011-09-21 NOTE — Assessment & Plan Note (Signed)
This with the pharmacist she actually has picked up of her Norvasc and amlodipine within the past 3 months for 90 day supplies differently she might actually be taking both of these she did not bring user bottle today. Blood pressure at goal

## 2011-09-22 DIAGNOSIS — R635 Abnormal weight gain: Secondary | ICD-10-CM | POA: Insufficient documentation

## 2011-09-22 NOTE — Assessment & Plan Note (Signed)
Pt gaining weight, good appetite

## 2011-10-01 ENCOUNTER — Ambulatory Visit (HOSPITAL_COMMUNITY)
Admission: RE | Admit: 2011-10-01 | Discharge: 2011-10-01 | Disposition: A | Discharge status: Home / Self Care | Payer: Medicare Other | Source: Ambulatory Visit | Attending: Family Medicine | Admitting: Family Medicine

## 2011-10-01 DIAGNOSIS — Z1231 Encounter for screening mammogram for malignant neoplasm of breast: Secondary | ICD-10-CM | POA: Insufficient documentation

## 2011-10-01 DIAGNOSIS — Z139 Encounter for screening, unspecified: Secondary | ICD-10-CM

## 2011-10-21 ENCOUNTER — Encounter: Payer: Self-pay | Admitting: Orthopedic Surgery

## 2011-10-21 ENCOUNTER — Ambulatory Visit (INDEPENDENT_AMBULATORY_CARE_PROVIDER_SITE_OTHER): Payer: Medicare Other | Admitting: Orthopedic Surgery

## 2011-10-21 VITALS — BP 120/70 | Ht 61.0 in | Wt 115.0 lb

## 2011-10-21 DIAGNOSIS — M25559 Pain in unspecified hip: Secondary | ICD-10-CM

## 2011-10-21 MED ORDER — TRAMADOL HCL 50 MG PO TABS: ORAL_TABLET | ORAL | Status: DC

## 2011-10-21 NOTE — Progress Notes (Signed)
Patient ID: [name] [name], female   DOB: 1921/05/21, 78 y.o.   MRN: [id number] Chief Complaint  Patient presents with  . Follow-up    recheck left hip   Comes in today for injection, LEFT hip refill, and tramadol.  The patient has a diagnosis of osteoarthritis, lumbar spine and LEFT hip.  Nonsurgical candidate  Comes in for injections as needed.  Repeat I'm injection LEFT hip

## 2011-10-21 NOTE — Patient Instructions (Signed)
You have received a steroid shot. 15% of patients experience increased pain at the injection site with in the next 24 hours. This is best treated with ice and tylenol extra strength 2 tabs every 8 hours. If you are still having pain please call the office.    

## 2011-11-17 ENCOUNTER — Other Ambulatory Visit (HOSPITAL_COMMUNITY): Payer: Self-pay | Admitting: Oncology

## 2011-12-03 ENCOUNTER — Ambulatory Visit (INDEPENDENT_AMBULATORY_CARE_PROVIDER_SITE_OTHER): Payer: Medicare Other | Admitting: Family Medicine

## 2011-12-03 ENCOUNTER — Encounter: Payer: Self-pay | Admitting: Family Medicine

## 2011-12-03 VITALS — BP 130/72 | HR 54 | Resp 18 | Ht 61.0 in | Wt 119.0 lb

## 2011-12-03 DIAGNOSIS — I10 Essential (primary) hypertension: Secondary | ICD-10-CM

## 2011-12-03 DIAGNOSIS — M25559 Pain in unspecified hip: Secondary | ICD-10-CM

## 2011-12-03 DIAGNOSIS — D638 Anemia in other chronic diseases classified elsewhere: Secondary | ICD-10-CM

## 2011-12-03 DIAGNOSIS — R5381 Other malaise: Secondary | ICD-10-CM

## 2011-12-03 DIAGNOSIS — M069 Rheumatoid arthritis, unspecified: Secondary | ICD-10-CM

## 2011-12-03 DIAGNOSIS — R531 Weakness: Secondary | ICD-10-CM

## 2011-12-03 DIAGNOSIS — R5383 Other fatigue: Secondary | ICD-10-CM

## 2011-12-03 MED ORDER — BENAZEPRIL HCL 20 MG PO TABS: 20.0000 mg | ORAL_TABLET | Freq: Every day | ORAL | Status: DC

## 2011-12-03 MED ORDER — AMLODIPINE BESYLATE 10 MG PO TABS: 10.0000 mg | ORAL_TABLET | Freq: Every day | ORAL | Status: DC

## 2011-12-03 MED ORDER — TRAMADOL HCL 50 MG PO TABS: ORAL_TABLET | ORAL | Status: DC

## 2011-12-03 NOTE — Patient Instructions (Signed)
I will follow-up on the scooter  Continue your blood pressure medications Continue pain pill Get the blood drawn F/U 3 months

## 2011-12-03 NOTE — Assessment & Plan Note (Signed)
CBC to be done

## 2011-12-03 NOTE — Assessment & Plan Note (Signed)
Send for motorized chair locally at [employer] as I have not received any good response for the hoveround

## 2011-12-03 NOTE — Progress Notes (Signed)
  Subjective:    Patient ID: [name] [name], female    DOB: 16-Sep-1920, 78 y.o.   MRN: [id number]  HPI Patient here for chronic medical problems. She still awaiting her scooter and supposedly someone from the Scooter store was supposed to meet her at this appointment. She's been taking both amlodipine and lisinopril. She has no concerns today. Someone was standing at her home and took half of her tramadol therefore she's been using Aleve   Review of Systems   GEN- denies fatigue, fever, weight loss,weakness, recent illness HEENT- denies eye drainage, change in vision, nasal discharge, CVS- denies chest pain, palpitations RESP- denies SOB, cough, wheeze ABD- denies N/V, change in stools, abd pain GU- denies dysuria, hematuria, dribbling, incontinence MSK-+ joint pain, muscle aches, injury Neuro- denies headache, dizziness, syncope, seizure activity      Objective:   Physical Exam GEN- NAD, alert and oriented x3, elderly, thin, sitting in wheelchair CVS- RRR, no murmur RESP-CTAB ABD- NABS, soft, NT, ND EXT- no pedal edema Skin- intact Neuro- in wheelchair, speech intact, mentation normal       Assessment & Plan:

## 2011-12-03 NOTE — Assessment & Plan Note (Signed)
Maintained on ultram which controls pain

## 2011-12-03 NOTE — Assessment & Plan Note (Signed)
Well controlled pt has been taking both meds, check renal function today

## 2011-12-04 LAB — CBC
HCT: 32.8 % — ABNORMAL LOW (ref 36.0–46.0)
MCHC: 32.3 g/dL (ref 30.0–36.0)
Platelets: 202 10*3/uL (ref 150–400)
RDW: 15.3 % (ref 11.5–15.5)
WBC: 5.9 10*3/uL (ref 4.0–10.5)

## 2011-12-04 LAB — BASIC METABOLIC PANEL
BUN: 25 mg/dL — ABNORMAL HIGH (ref 6–23)
CO2: 26 mEq/L (ref 19–32)
Calcium: 9.4 mg/dL (ref 8.4–10.5)
Creat: 0.94 mg/dL (ref 0.50–1.10)
Glucose, Bld: 92 mg/dL (ref 70–99)
Sodium: 140 mEq/L (ref 135–145)

## 2011-12-14 ENCOUNTER — Ambulatory Visit: Payer: Medicare Other | Admitting: Family Medicine

## 2011-12-21 ENCOUNTER — Ambulatory Visit: Payer: Medicare Other | Admitting: Family Medicine

## 2012-01-28 ENCOUNTER — Ambulatory Visit: Payer: Medicare Other | Admitting: Orthopedic Surgery

## 2012-02-03 ENCOUNTER — Ambulatory Visit (INDEPENDENT_AMBULATORY_CARE_PROVIDER_SITE_OTHER): Payer: Medicare Other | Admitting: Orthopedic Surgery

## 2012-02-03 ENCOUNTER — Encounter: Payer: Self-pay | Admitting: Orthopedic Surgery

## 2012-02-03 VITALS — BP 124/70 | Ht 61.0 in | Wt 119.0 lb

## 2012-02-03 DIAGNOSIS — M25559 Pain in unspecified hip: Secondary | ICD-10-CM

## 2012-02-03 NOTE — Patient Instructions (Addendum)
You have received a steroid shot. 15% of patients experience increased pain at the injection site with in the next 24 hours. This is best treated with ice and tylenol extra strength 2 tabs every 8 hours. If you are still having pain please call the office.   activities as tolerated  

## 2012-02-03 NOTE — Progress Notes (Signed)
Patient ID: [name] [name], female   DOB: 05-10-1921, 78 y.o.   MRN: [id number] Chief Complaint  Patient presents with  . Injections    requests left hip injection, last injection was 10/21/11    BP 124/70  Ht 5\' 1"  (1.549 m)  Wt 119 lb (53.978 kg)  BMI 22.48 kg/m2  Injection left hip IM. Patient chronic pain receives injections every 2-3 months. Repeat injection  Verbal consent. Timeout. 40 mg of Depo-Medrol and 3 cc lidocaine 1% injected left gluteal area no complications

## 2012-02-10 ENCOUNTER — Ambulatory Visit (HOSPITAL_COMMUNITY)
Admission: RE | Admit: 2012-02-10 | Discharge: 2012-02-10 | Disposition: A | Discharge status: Home / Self Care | Payer: Medicare Other | Source: Ambulatory Visit | Attending: Family Medicine | Admitting: Family Medicine

## 2012-02-10 DIAGNOSIS — M25559 Pain in unspecified hip: Secondary | ICD-10-CM | POA: Insufficient documentation

## 2012-02-10 DIAGNOSIS — IMO0001 Reserved for inherently not codable concepts without codable children: Secondary | ICD-10-CM | POA: Insufficient documentation

## 2012-02-10 DIAGNOSIS — M069 Rheumatoid arthritis, unspecified: Secondary | ICD-10-CM | POA: Insufficient documentation

## 2012-02-10 DIAGNOSIS — R262 Difficulty in walking, not elsewhere classified: Secondary | ICD-10-CM | POA: Insufficient documentation

## 2012-02-10 NOTE — Evaluation (Signed)
Occupational Therapy Evaluation - Power Wheelchair Evaluation  Patient Details  Name: [name] [name] MRN: [id number] Date of Birth: 07-28-1920  Today's Date: 02/10/2012 Time: 1400-1430 OT Time Calculation (min): 30 min  Visit#: 1  of 1    Authorization: [employer]  Authorization Time Period:     Past Medical History:  Past Medical History  Diagnosis Date  . GERD (gastroesophageal reflux disease)   . Complete rupture of rotator cuff   . Pain in joint, shoulder region   . Chronic kidney disease, stage I   . Iron deficiency anemia, unspecified   . Bereavement, uncomplicated   . Edema   . Hyperglycemia   . Unspecified constipation   . Osteoporosis, unspecified   . OA (osteoarthritis)   . Unspecified essential hypertension   . Other and unspecified hyperlipidemia   . Diverticulosis of colon (without mention of hemorrhage)   . Herpes simplex keratitis 2011   Past Surgical History:  Past Surgical History  Procedure Date  . Abdominal hysterectomy   . Benign cyst removal from lateral chest   . Eye surgery     cataract    Problem List Patient Active Problem List  Diagnosis  . LIVER MASS  . HYPERLIPIDEMIA  . DIVERTICULOSIS, COLON  . CONSTIPATION NOS  . KIDNEY DISEASE, CHRONIC, STAGE I  . OSTEOARTHRITIS  . HIP PAIN, LEFT  . OSTEOPOROSIS  . Pubic bone fracture  . Hypertension  . Anemia of chronic disease  . Rheumatoid arthritis  . Gait instability  . Sciatica  . Generalized weakness  . Weight gain  02/10/12 RE:     [name] [name]. [name]  [address]  [place], [place] [address]  DOB:  06-03-2021  Insurance:  [facility] Medicare To Whom It May Concern,  Ms. [name] [name] was referred to this clinic for a power wheelchair assessment today due to difficulty walking.  She is an 78 year old female with rheumatoid arthritis that affects almost every joint in her body.  She fell last year and fractured her left hip.  She is not a candidate for surgery, and receives  injections in her left hip every three months.  She has fallen 2 times in the past 6 months in her home.  Her pain level in her hip can reach a 10/10.  She has significant joint changes due to RA in her hands.    [name] [name] lives alone.  She lives in a one story home with a ramped entrance.  She is able to dress herself and receives assistance with bathing.  She is independent with grooming, eating, and light housework and cooking.   [name] [name] states that a power wheelchair will "make me it easier and quicker to get around my home."  A FULL PHYSICAL ASSESSMENT REVEALS THE FOLLOWING    Existing Equipment: [name] [name] has 2 straight canes that she ambulates with in her home.  She has a bedside commode and a shower seat.  She had a standard wheelchair, but turned it back into the DME company, as it was too difficult to propel independently. Transfers:   [name] [name] transferred independently today.  She ambulated 5 feet x 2 with 2 canes with a slow gait.  Her caregiver, [name] [name], states that she does get winded walking approximately 40 feet or more in her home.    Head and Neck: WFL.   Trunk:  WFL.   Pelvis:    WFL.  Hip:   Bilateral AROM WFL 75%, strength 4/5 in right and  4-/5 in left.    Knees: Bilateral AROM WFL 75%, strength 4/5.    Feet and Ankles: Bilateral AROM WFL 75%, strength 4/5.    Upper Extremities: [name] [name] has significant limitations in her upper extremities due to arthritis.  She is limited to 50% AROM in her shoulders and 50%-75% in her elbows, wrists, and hands.  Her BUE strength is 4/5 in the available range.  Weight Shifting Ability: [name] [name] is independent with weight shifting.  Skin Integrity: N/A.    Cognition:  WFL.  Activity Tolerance:  fair +.   GOALS/OBJECTIVE OF SEATING INTERVENTION  Recommendations: [name] [name] would benefit from a power wheelchair for use in her home. She is unable to independently propel a standard wheelchair due to  decreased AROM and strength resulting from rheumatoid arthritis.  Due to her history of a hip fracture from a fall and 2 additional falls in her home, I feel a power wheelchair would increase [name] [name]'s safety and independence with daily activities, as well as enrich her quality of life.    If you require any further information concerning [name] [name]'s positioning, independence or mobility needs; or any further information why a lesser device will not work, please do not hesitate to contact me at [facility], [address] [place] [address] [phone].   Thank you for this referral,   ____________________       __________ Sondra Barges, OTR/L        Date        End of Session Activity Tolerance: Patient tolerated treatment well General Behavior During Session: [facility] for tasks performed Cognition: [facility] for tasks performed  GO Functional Assessment Tool Used: clinical observation Functional Limitation: Self care Self Care Current Status (H0865): At least 1 percent but less than 20 percent impaired, limited or restricted Self Care Goal Status (H8469): At least 1 percent but less than 20 percent impaired, limited or restricted Self Care Discharge Status [phone]): At least 1 percent but less than 20 percent impaired, limited or restricted  Shirlean Mylar, OTR/L  02/10/2012, 2:46 PM  Physician Documentation Your signature is required to indicate approval of the treatment plan as stated above.  Please sign and either send electronically or make a copy of this report for your files and return this physician signed original.  Please mark one 1.__approve of plan  2. ___approve of plan with the following conditions.   ______________________________                                                          _____________________ Physician Signature                                                                                                             Date

## 2012-04-13 ENCOUNTER — Other Ambulatory Visit: Payer: Self-pay | Admitting: Family Medicine

## 2012-05-10 ENCOUNTER — Ambulatory Visit (INDEPENDENT_AMBULATORY_CARE_PROVIDER_SITE_OTHER): Payer: Medicare Other | Admitting: Orthopedic Surgery

## 2012-05-10 DIAGNOSIS — M25559 Pain in unspecified hip: Secondary | ICD-10-CM

## 2012-05-10 DIAGNOSIS — G8929 Other chronic pain: Secondary | ICD-10-CM

## 2012-05-10 MED ORDER — TRAMADOL HCL 50 MG PO TABS: ORAL_TABLET | ORAL | Status: DC

## 2012-05-10 NOTE — Progress Notes (Signed)
Patient ID: [name] [name], female   DOB: 1921/05/14, 78 y.o.   MRN: [id number] Chief Complaint  Patient presents with  . Injections    request injection hip     Injection LEFT hip.  The patient comes in today for her three-month injection in her LEFT hip.  An IM injection.  After verbal consent and timeout to confirm the site. The area was prepped with alcohol and ethyl chloride and injected with 40 mg of Depo-Medrol and 3 cc of 1% lidocaine. It was well tolerated without complication.  The patient will return in 3 month

## 2012-05-10 NOTE — Patient Instructions (Addendum)
You have received a steroid shot. 15% of patients experience increased pain at the injection site with in the next 24 hours. This is best treated with ice and tylenol extra strength 2 tabs every 8 hours. If you are still having pain please call the office.    

## 2012-06-28 IMAGING — CR DG LUMBAR SPINE COMPLETE 4+V
5 series · 5 of 5 positions shown · non-contrast
Comparison: Abdominal pelvic CT of 04/16/2010.

CLINICAL DATA: Fall today.  Upper and lower back pain.

LUMBAR SPINE - COMPLETE 4+ VIEW

[view not recorded (1 of 5)]
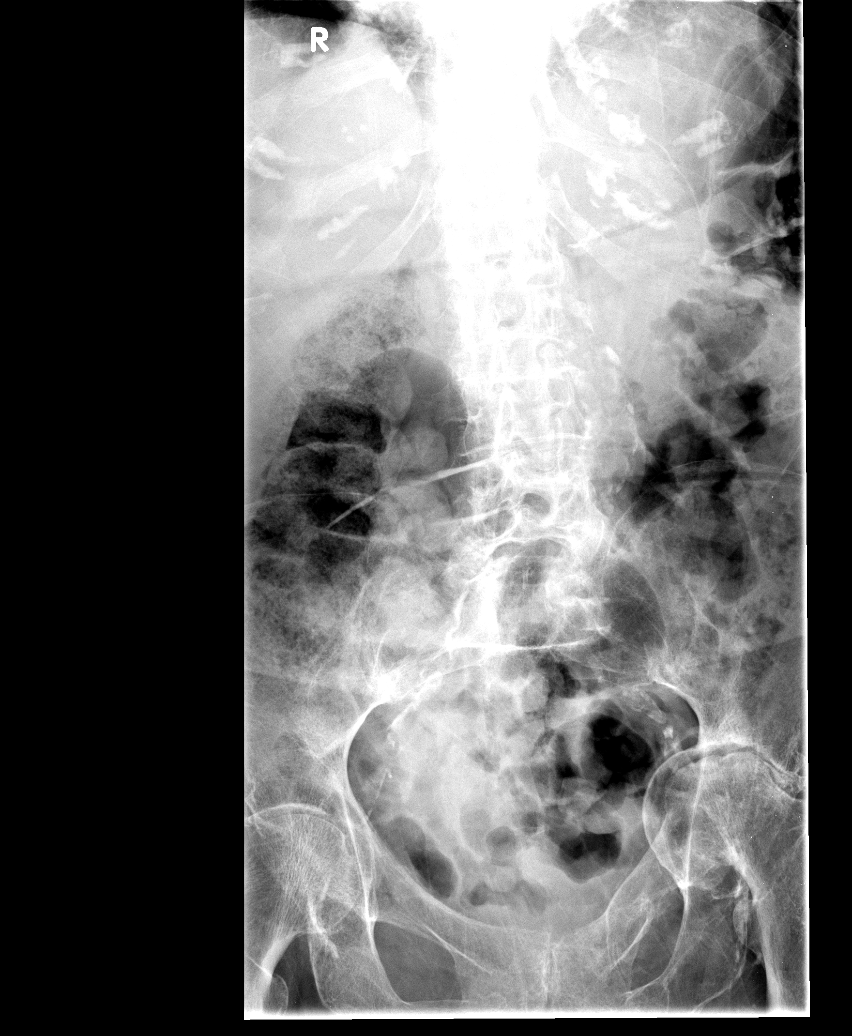

[view not recorded (2 of 5)]
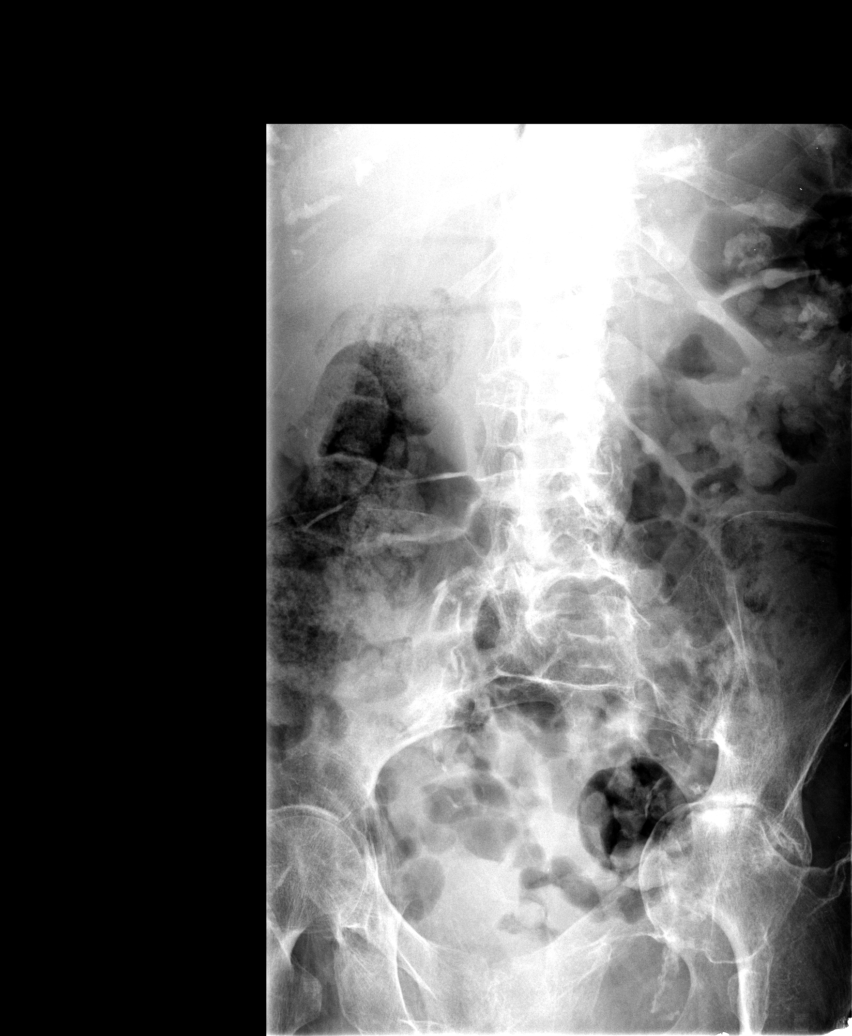

[view not recorded (3 of 5)]
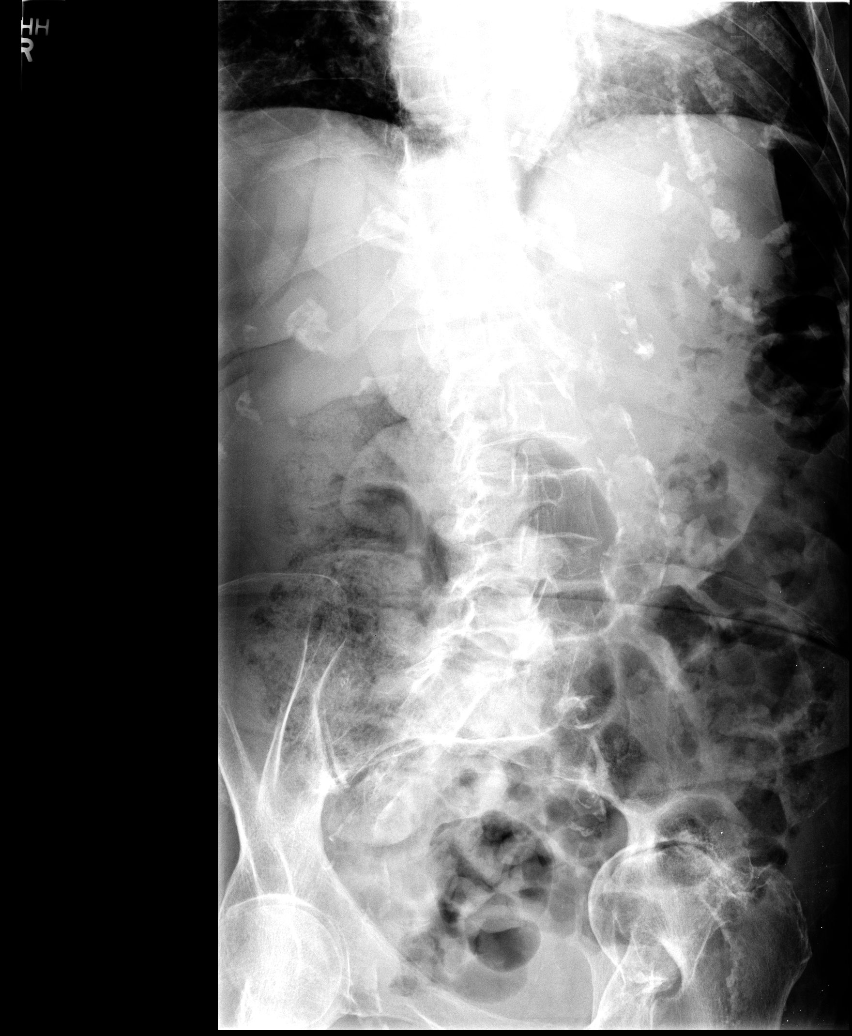

[view not recorded (4 of 5)]
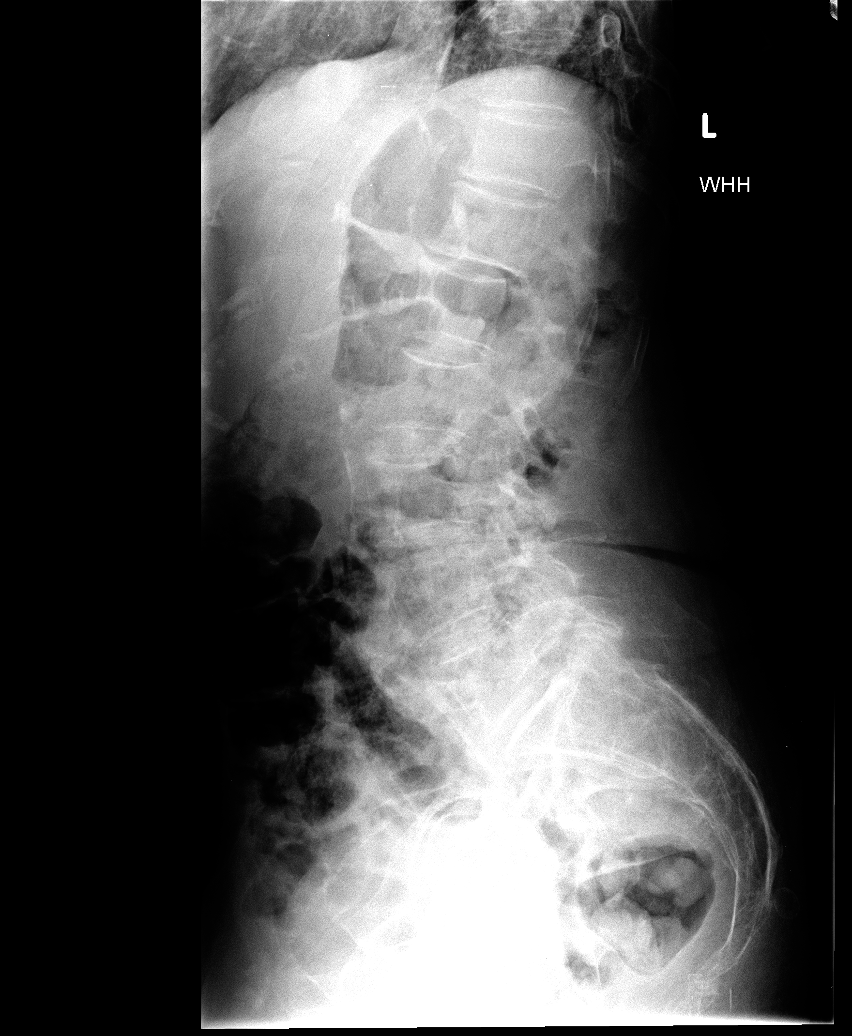

[view not recorded (5 of 5)]
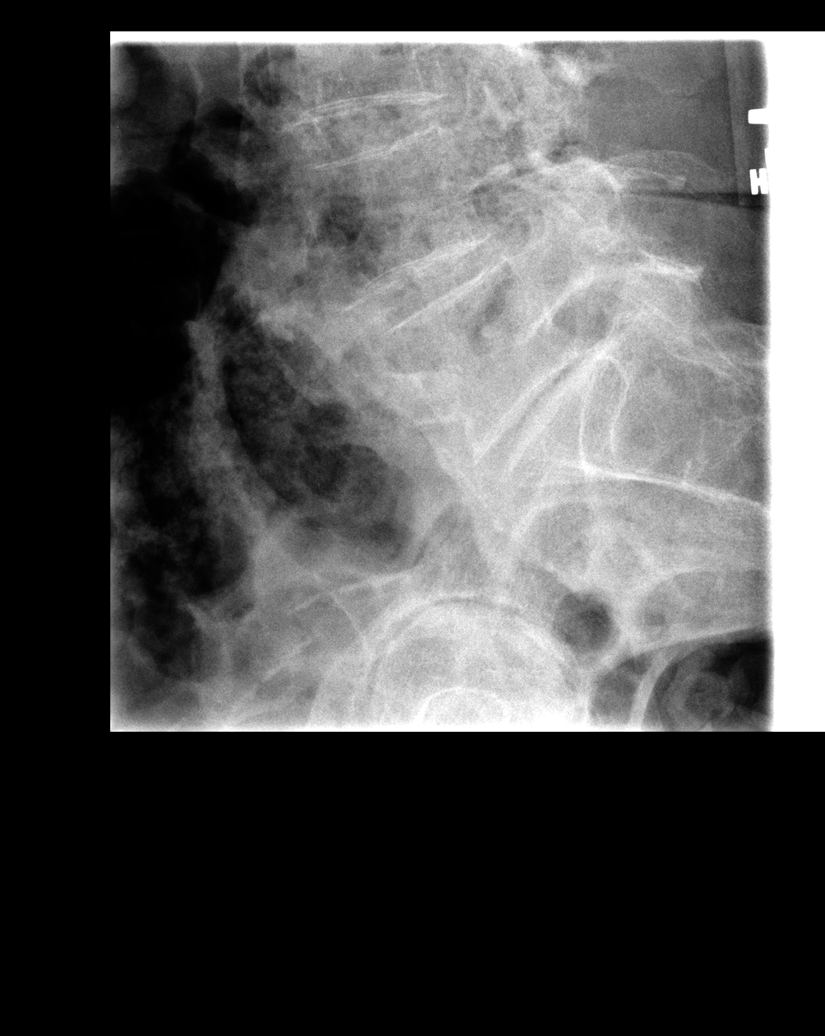

[5 of 5 positions shown; findings below may reference images not displayed]

FINDINGS: Moderate osteopenia.  Five lumbar vertebral bodies.  AP
view degraded secondary to overlying stool. Severe left hip
osteoarthritis.  Dense aortic atherosclerosis.  Vertebral body
height is grossly maintained.  Given the extent of osteopenia,
subtle endplate abnormalities could be obscured.
Degenerative disc disease at the lumbosacral junction.
IMPRESSION: Degraded exam, as detailed above.  Osteopenia without convincing
evidence of acute injury.

Severe left hip osteoarthritis.

## 2012-08-10 ENCOUNTER — Ambulatory Visit (INDEPENDENT_AMBULATORY_CARE_PROVIDER_SITE_OTHER): Payer: Medicare Other | Admitting: Orthopedic Surgery

## 2012-08-10 VITALS — BP 140/60 | Ht 61.0 in | Wt 119.0 lb

## 2012-08-10 DIAGNOSIS — IMO0002 Reserved for concepts with insufficient information to code with codable children: Secondary | ICD-10-CM

## 2012-08-10 DIAGNOSIS — M25562 Pain in left knee: Secondary | ICD-10-CM | POA: Insufficient documentation

## 2012-08-10 DIAGNOSIS — M25569 Pain in unspecified knee: Secondary | ICD-10-CM

## 2012-08-10 DIAGNOSIS — M1710 Unilateral primary osteoarthritis, unspecified knee: Secondary | ICD-10-CM

## 2012-08-10 DIAGNOSIS — M179 Osteoarthritis of knee, unspecified: Secondary | ICD-10-CM | POA: Insufficient documentation

## 2012-08-10 DIAGNOSIS — M5410 Radiculopathy, site unspecified: Secondary | ICD-10-CM | POA: Insufficient documentation

## 2012-08-10 MED ORDER — TRAMADOL HCL 50 MG PO TABS: 50.0000 mg | ORAL_TABLET | Freq: Four times a day (QID) | ORAL | Status: DC | PRN

## 2012-08-10 NOTE — Progress Notes (Signed)
Patient ID: [name] [name], female   DOB: 12/18/1920, 79 y.o.   MRN: [id number] Chief Complaint  Patient presents with  . Follow-up    left hip pain    BP 140/60  Ht 5\' 1"  (1.549 m)  Wt 119 lb (53.978 kg)  BMI 22.5 kg/m2  History the patient came in today for left hip injection for chronic pain in her left leg which we believe is radicular in nature. She's not a surgical candidate based on age and overall health.  However she then complained of pain in her left knee radiating proximally not distal to proximal.  Review of systems is no bowel or bladder dysfunction but weakness and pain is a dull ache in the left knee  Exam she is in a wheelchair she looks good overall in terms of her parents  Her vitals are as recorded. She is oriented x3. Her mood is pleasant. She has diffuse tenderness in her knee there is no joint effusion. Range of motion is limited 115. Knee is stable muscle tone normal skin is intact is a good distal pulse  Impression radicular pain left leg  Impression osteoarthritic pain left knee  Left knee pain - Plan: traMADol (ULTRAM) 50 MG tablet  OA (osteoarthritis) of knee  Radicular pain of left lower extremity    Inject left knee  Knee  Injection Procedure Note  Pre-operative Diagnosis: left knee oa  Post-operative Diagnosis: same  Indications: pain  Anesthesia: ethyl chloride   Procedure Details   Verbal consent was obtained for the procedure. Time out was completed.The joint was prepped with alcohol, followed by  Ethyl chloride spray and A 20 gauge needle was inserted into the knee via lateral approach; 4ml 1% lidocaine and 1 ml of depomedrol  was then injected into the joint . The needle was removed and the area cleansed and dressed.  Complications:  None; patient tolerated the procedure well.

## 2012-08-12 ENCOUNTER — Other Ambulatory Visit: Payer: Self-pay | Admitting: Family Medicine

## 2012-09-08 ENCOUNTER — Ambulatory Visit: Payer: Medicare Other | Admitting: Family Medicine

## 2012-09-12 ENCOUNTER — Encounter: Payer: Self-pay | Admitting: Family Medicine

## 2012-09-12 ENCOUNTER — Ambulatory Visit (INDEPENDENT_AMBULATORY_CARE_PROVIDER_SITE_OTHER): Payer: Medicare Other | Admitting: Family Medicine

## 2012-09-12 VITALS — BP 136/78 | HR 64 | Resp 16 | Ht 61.0 in | Wt 121.0 lb

## 2012-09-12 DIAGNOSIS — E785 Hyperlipidemia, unspecified: Secondary | ICD-10-CM

## 2012-09-12 DIAGNOSIS — R269 Unspecified abnormalities of gait and mobility: Secondary | ICD-10-CM

## 2012-09-12 DIAGNOSIS — M1990 Unspecified osteoarthritis, unspecified site: Secondary | ICD-10-CM

## 2012-09-12 DIAGNOSIS — N390 Urinary tract infection, site not specified: Secondary | ICD-10-CM

## 2012-09-12 DIAGNOSIS — K5900 Constipation, unspecified: Secondary | ICD-10-CM

## 2012-09-12 DIAGNOSIS — M069 Rheumatoid arthritis, unspecified: Secondary | ICD-10-CM

## 2012-09-12 DIAGNOSIS — N181 Chronic kidney disease, stage 1: Secondary | ICD-10-CM

## 2012-09-12 DIAGNOSIS — I10 Essential (primary) hypertension: Secondary | ICD-10-CM

## 2012-09-12 LAB — POCT URINALYSIS DIPSTICK
Glucose, UA: NEGATIVE
Protein, UA: 100

## 2012-09-12 MED ORDER — PRAVASTATIN SODIUM 20 MG PO TABS: ORAL_TABLET | ORAL | Status: DC

## 2012-09-12 MED ORDER — BENAZEPRIL HCL 20 MG PO TABS: 20.0000 mg | ORAL_TABLET | Freq: Every day | ORAL | Status: DC

## 2012-09-12 MED ORDER — AMLODIPINE BESYLATE 10 MG PO TABS: 10.0000 mg | ORAL_TABLET | Freq: Every day | ORAL | Status: DC

## 2012-09-12 MED ORDER — CEPHALEXIN 500 MG PO CAPS: 500.0000 mg | ORAL_CAPSULE | Freq: Two times a day (BID) | ORAL | Status: AC

## 2012-09-12 NOTE — Patient Instructions (Signed)
Start the antibiotics for the urine Get the labs done today Drink ensure twice a day F/U 3 months

## 2012-09-13 LAB — CBC WITH DIFFERENTIAL/PLATELET
Eosinophils Absolute: 0 10*3/uL (ref 0.0–0.7)
Eosinophils Relative: 0 % (ref 0–5)
HCT: 36.5 % (ref 36.0–46.0)
Hemoglobin: 11.9 g/dL — ABNORMAL LOW (ref 12.0–15.0)
Lymphs Abs: 0.8 10*3/uL (ref 0.7–4.0)
MCH: 27.3 pg (ref 26.0–34.0)
MCV: 83.7 fL (ref 78.0–100.0)
Monocytes Absolute: 0.5 10*3/uL (ref 0.1–1.0)
Monocytes Relative: 8 % (ref 3–12)
Neutrophils Relative %: 80 % — ABNORMAL HIGH (ref 43–77)
RBC: 4.36 MIL/uL (ref 3.87–5.11)

## 2012-09-13 LAB — COMPREHENSIVE METABOLIC PANEL
Albumin: 4.5 g/dL (ref 3.5–5.2)
BUN: 17 mg/dL (ref 6–23)
CO2: 27 mEq/L (ref 19–32)
Calcium: 9.5 mg/dL (ref 8.4–10.5)
Chloride: 102 mEq/L (ref 96–112)
Glucose, Bld: 78 mg/dL (ref 70–99)
Potassium: 5.1 mEq/L (ref 3.5–5.3)

## 2012-09-14 ENCOUNTER — Encounter: Payer: Self-pay | Admitting: Family Medicine

## 2012-09-14 NOTE — Assessment & Plan Note (Signed)
Reviewed ortho note, nephew is giving the  Ultram as she has tended to take too many in the past

## 2012-09-14 NOTE — Assessment & Plan Note (Signed)
This and OA controlled with ultram

## 2012-09-14 NOTE — Assessment & Plan Note (Signed)
Start antibiotics, sent for culture

## 2012-09-14 NOTE — Assessment & Plan Note (Signed)
Well controlled, no change, check bmet

## 2012-09-14 NOTE — Assessment & Plan Note (Signed)
Uses 2 canes, rare use of wheelchair

## 2012-09-14 NOTE — Assessment & Plan Note (Signed)
Miralax to be used

## 2012-09-14 NOTE — Assessment & Plan Note (Signed)
Creatinine stable.

## 2012-09-14 NOTE — Progress Notes (Signed)
  Subjective:    Patient ID: [name] [name], female    DOB: 25-Sep-1920, 79 y.o.   MRN: [id number]  HPI Pt here to f/u chronic medical problems, has not been seen summer 2013. Now being cared for by her great nephew Shari Prows. [facility] nurse was in home, noticed no follow-up, pt also had urinary frequency with very foul smelling urine. Eating well, sleeping well. FOllowing with ortho for the OA and Hip She has no specific concerns   Review of Systems  GEN- denies fatigue, fever, weight loss,weakness, recent illness HEENT- denies eye drainage, change in vision, nasal discharge, CVS- denies chest pain, palpitations RESP- denies SOB, cough, wheeze ABD- denies N/V, change in stools, abd pain GU- + dysuria, hematuria, dribbling,+ incontinence MSK- denies joint pain, muscle aches, injury Neuro- denies headache, dizziness, syncope, seizure activity      Objective:   Physical Exam GEN- NAD, alert and oriented x3 HEENT- PERRL, EOMI, non injected sclera, pink conjunctiva, MMM, oropharynx clear Neck- Supple,  CVS- RRR, no murmur RESP-CTAB ABD-NABS,soft,NT,ND,no CVA tenderness GU- wears depends had BM, cather specimen obtained,  EXT- No edema, rheumatoid contractures of hands Pulses- Radial, DP- 2+ MSK- decreased ROM hip, back, UE       Assessment & Plan:

## 2012-09-16 LAB — URINE CULTURE

## 2012-11-10 ENCOUNTER — Encounter: Payer: Self-pay | Admitting: Orthopedic Surgery

## 2012-11-10 ENCOUNTER — Ambulatory Visit (INDEPENDENT_AMBULATORY_CARE_PROVIDER_SITE_OTHER): Payer: Medicare Other | Admitting: Orthopedic Surgery

## 2012-11-10 VITALS — BP 138/60 | Ht 61.0 in | Wt 119.0 lb

## 2012-11-10 DIAGNOSIS — IMO0002 Reserved for concepts with insufficient information to code with codable children: Secondary | ICD-10-CM

## 2012-11-10 DIAGNOSIS — M5410 Radiculopathy, site unspecified: Secondary | ICD-10-CM

## 2012-11-10 DIAGNOSIS — M1990 Unspecified osteoarthritis, unspecified site: Secondary | ICD-10-CM

## 2012-11-10 DIAGNOSIS — M1710 Unilateral primary osteoarthritis, unspecified knee: Secondary | ICD-10-CM

## 2012-11-10 DIAGNOSIS — M25552 Pain in left hip: Secondary | ICD-10-CM

## 2012-11-10 DIAGNOSIS — M25559 Pain in unspecified hip: Secondary | ICD-10-CM

## 2012-11-10 NOTE — Progress Notes (Signed)
Patient ID: [name] [name], female   DOB: 1921-03-22, 79 y.o.   MRN: [id number] Chief Complaint  Patient presents with  . Follow-up    Recheck left knee, request injection in knee and hip    The patient has history of chronic left leg pain which is really related to spinal stenosis she requests injection left knee and left hip radicular paiin  She also has some arthritis in that left knee.    Knee  Injection Procedure Note  Pre-operative Diagnosis: left knee oa  Post-operative Diagnosis: same  Indications: pain  Anesthesia: ethyl chloride   Procedure Details   Verbal consent was obtained for the procedure. Time out was completed.The joint was prepped with alcohol, followed by  Ethyl chloride spray and A 20 gauge needle was inserted into the knee via lateral approach; 4ml 1% lidocaine and 1 ml of depomedrol  was then injected into the joint . The needle was removed and the area cleansed and dressed.  Complications:  None; patient tolerated the procedure well.  Intramuscular hip injection Diagnosis left leg radicular pain Postprocedure diagnosis same Pain radicular in nature Anesthesia ethyl chloride Procedure details   Verbal consent was obtained for the procedure timeout was completed to confirm site the joint was prepped with alcohol followed by ethyl chloride spray and then a 25-gauge needle was used to give intramuscular injection of Depo-Medrol and lidocaine 1 mL and 4 mL respectively into the left hip  No complications

## 2012-11-10 NOTE — Patient Instructions (Signed)
You have received a steroid shot. 15% of patients experience increased pain at the injection site with in the next 24 hours. This is best treated with ice and tylenol extra strength 2 tabs every 8 hours. If you are still having pain please call the office.    

## 2012-11-11 ENCOUNTER — Other Ambulatory Visit: Payer: Self-pay | Admitting: Family Medicine

## 2012-11-11 DIAGNOSIS — Z139 Encounter for screening, unspecified: Secondary | ICD-10-CM

## 2012-11-14 ENCOUNTER — Ambulatory Visit (HOSPITAL_COMMUNITY): Payer: Medicare Other

## 2012-11-15 ENCOUNTER — Ambulatory Visit (HOSPITAL_COMMUNITY)
Admission: RE | Admit: 2012-11-15 | Discharge: 2012-11-15 | Disposition: A | Discharge status: Home / Self Care | Payer: Medicare Other | Source: Ambulatory Visit | Attending: Family Medicine | Admitting: Family Medicine

## 2012-11-15 DIAGNOSIS — Z139 Encounter for screening, unspecified: Secondary | ICD-10-CM

## 2012-11-15 DIAGNOSIS — Z1231 Encounter for screening mammogram for malignant neoplasm of breast: Secondary | ICD-10-CM | POA: Insufficient documentation

## 2012-11-28 ENCOUNTER — Other Ambulatory Visit: Payer: Self-pay | Admitting: Family Medicine

## 2012-11-28 NOTE — Telephone Encounter (Signed)
Med rf °

## 2012-12-12 ENCOUNTER — Ambulatory Visit: Payer: Self-pay | Admitting: Family Medicine

## 2012-12-12 ENCOUNTER — Ambulatory Visit: Payer: Medicare Other | Admitting: Family Medicine

## 2013-02-09 ENCOUNTER — Ambulatory Visit (INDEPENDENT_AMBULATORY_CARE_PROVIDER_SITE_OTHER): Payer: Medicare Other | Admitting: Orthopedic Surgery

## 2013-02-09 VITALS — BP 167/85 | Ht 61.0 in | Wt 119.0 lb

## 2013-02-09 DIAGNOSIS — M5410 Radiculopathy, site unspecified: Secondary | ICD-10-CM

## 2013-02-09 DIAGNOSIS — M1710 Unilateral primary osteoarthritis, unspecified knee: Secondary | ICD-10-CM

## 2013-02-09 DIAGNOSIS — IMO0002 Reserved for concepts with insufficient information to code with codable children: Secondary | ICD-10-CM

## 2013-02-09 NOTE — Progress Notes (Signed)
Patient ID: [name] [name], female   DOB: [birth year], 79 y.o.   MRN: [id number]  Chief Complaint  Patient presents with  . Follow-up    recheck left hip, request IM injection   "Get me right"  The patient wants a shot in her left hip and her left knee Knee  Injection Procedure Note  Pre-operative Diagnosis: left knee oa  Post-operative Diagnosis: same  Indications: pain  Anesthesia: ethyl chloride   Procedure Details   Verbal consent was obtained for the procedure. Time out was completed.The joint was prepped with alcohol, followed by  Ethyl chloride spray and A 20 gauge needle was inserted into the knee via lateral approach; 4ml 1% lidocaine and 1 ml of depomedrol  was then injected into the joint . The needle was removed and the area cleansed and dressed.  Complications:  None; patient tolerated the procedure well.   Intramuscular injection left hip Left hip pain Radicular pain Chronic pain  Verbal consent time out  Sterile prep inject left hip IM no complications  Medications 1% lidocaine 3 cc Medications Depo-Medrol 40 mg

## 2013-02-09 NOTE — Patient Instructions (Signed)
You have received a steroid shot. 15% of patients experience increased pain at the injection site with in the next 24 hours. This is best treated with ice and tylenol extra strength 2 tabs every 8 hours. If you are still having pain please call the office.    

## 2013-04-06 ENCOUNTER — Telehealth: Payer: Self-pay | Admitting: Family Medicine

## 2013-04-06 NOTE — Telephone Encounter (Signed)
Amlodipine 10 mg and Benazepril 20 mg

## 2013-04-10 ENCOUNTER — Encounter: Payer: Self-pay | Admitting: Family Medicine

## 2013-04-10 ENCOUNTER — Other Ambulatory Visit: Payer: Self-pay | Admitting: *Deleted

## 2013-04-10 DIAGNOSIS — M25562 Pain in left knee: Secondary | ICD-10-CM

## 2013-04-10 MED ORDER — AMLODIPINE BESYLATE 10 MG PO TABS: 10.0000 mg | ORAL_TABLET | Freq: Every day | ORAL | Status: DC

## 2013-04-10 MED ORDER — BENAZEPRIL HCL 20 MG PO TABS: 20.0000 mg | ORAL_TABLET | Freq: Every day | ORAL | Status: DC

## 2013-04-10 MED ORDER — TRAMADOL HCL 50 MG PO TABS: 50.0000 mg | ORAL_TABLET | Freq: Four times a day (QID) | ORAL | Status: DC | PRN

## 2013-04-10 NOTE — Telephone Encounter (Signed)
Medication refill for 30 + 1 only.  Patient needs to be seen.  Letter sent for patient to call and schedule

## 2013-04-10 NOTE — Telephone Encounter (Signed)
Please call to schedule an appt, give 30 day, supply and 1 refill

## 2013-05-16 ENCOUNTER — Ambulatory Visit (INDEPENDENT_AMBULATORY_CARE_PROVIDER_SITE_OTHER): Payer: Medicare Other | Admitting: Orthopedic Surgery

## 2013-05-16 VITALS — BP 148/92 | Ht 61.0 in | Wt 119.0 lb

## 2013-05-16 DIAGNOSIS — IMO0002 Reserved for concepts with insufficient information to code with codable children: Secondary | ICD-10-CM

## 2013-05-16 DIAGNOSIS — M25569 Pain in unspecified knee: Secondary | ICD-10-CM

## 2013-05-16 DIAGNOSIS — M1710 Unilateral primary osteoarthritis, unspecified knee: Secondary | ICD-10-CM

## 2013-05-16 DIAGNOSIS — M5410 Radiculopathy, site unspecified: Secondary | ICD-10-CM

## 2013-05-16 NOTE — Progress Notes (Signed)
Patient ID: [name] [name], female   DOB: Aug 11, 1920, 79 y.o.   MRN: [id number]  Chief Complaint  Patient presents with  . Follow-up    3 month follow left knee and left hip injection    Left knee left hip injection   .sehlkni Knee  Injection Procedure Note  Pre-operative Diagnosis: left knee oa  Post-operative Diagnosis: same  Indications: pain  Anesthesia: ethyl chloride   Procedure Details   Verbal consent was obtained for the procedure. Time out was completed.The joint was prepped with alcohol, followed by  Ethyl chloride spray and A 20 gauge needle was inserted into the knee via lateral approach; 4ml 1% lidocaine and 1 ml of depomedrol  was then injected into the joint . The needle was removed and the area cleansed and dressed.  Complications:  None; patient tolerated the procedure well.  Procedure note left hip IM injection  Verbal consent Confirmatory timeout Medication:  Depo-Medrol 40 mg  Lidocaine 1% 3 cc  Alcohol prep ethyl chloride spray given by nurse no complications

## 2013-05-16 NOTE — Patient Instructions (Signed)
Followup every 3 months for knee injections as needed

## 2013-06-02 ENCOUNTER — Emergency Department (HOSPITAL_COMMUNITY): Payer: Medicare Other

## 2013-06-02 ENCOUNTER — Encounter (HOSPITAL_COMMUNITY): Payer: Self-pay | Admitting: Emergency Medicine

## 2013-06-02 ENCOUNTER — Emergency Department (HOSPITAL_COMMUNITY)
Admission: EM | Admit: 2013-06-02 | Discharge: 2013-06-03 | Disposition: A | Discharge status: Home / Self Care | Payer: Medicare Other | Attending: Emergency Medicine | Admitting: Emergency Medicine

## 2013-06-02 DIAGNOSIS — N181 Chronic kidney disease, stage 1: Secondary | ICD-10-CM | POA: Insufficient documentation

## 2013-06-02 DIAGNOSIS — M2569 Stiffness of other specified joint, not elsewhere classified: Secondary | ICD-10-CM | POA: Insufficient documentation

## 2013-06-02 DIAGNOSIS — Z7982 Long term (current) use of aspirin: Secondary | ICD-10-CM | POA: Insufficient documentation

## 2013-06-02 DIAGNOSIS — E785 Hyperlipidemia, unspecified: Secondary | ICD-10-CM | POA: Insufficient documentation

## 2013-06-02 DIAGNOSIS — I129 Hypertensive chronic kidney disease with stage 1 through stage 4 chronic kidney disease, or unspecified chronic kidney disease: Secondary | ICD-10-CM | POA: Insufficient documentation

## 2013-06-02 DIAGNOSIS — Z87891 Personal history of nicotine dependence: Secondary | ICD-10-CM | POA: Insufficient documentation

## 2013-06-02 DIAGNOSIS — M1990 Unspecified osteoarthritis, unspecified site: Secondary | ICD-10-CM | POA: Insufficient documentation

## 2013-06-02 DIAGNOSIS — Z8619 Personal history of other infectious and parasitic diseases: Secondary | ICD-10-CM | POA: Insufficient documentation

## 2013-06-02 DIAGNOSIS — R410 Disorientation, unspecified: Secondary | ICD-10-CM

## 2013-06-02 DIAGNOSIS — Z862 Personal history of diseases of the blood and blood-forming organs and certain disorders involving the immune mechanism: Secondary | ICD-10-CM | POA: Insufficient documentation

## 2013-06-02 DIAGNOSIS — Z79899 Other long term (current) drug therapy: Secondary | ICD-10-CM | POA: Insufficient documentation

## 2013-06-02 DIAGNOSIS — Z8719 Personal history of other diseases of the digestive system: Secondary | ICD-10-CM | POA: Insufficient documentation

## 2013-06-02 DIAGNOSIS — N390 Urinary tract infection, site not specified: Secondary | ICD-10-CM | POA: Insufficient documentation

## 2013-06-02 LAB — COMPREHENSIVE METABOLIC PANEL
ALT: 6 U/L (ref 0–35)
AST: 10 U/L (ref 0–37)
Albumin: 4 g/dL (ref 3.5–5.2)
Alkaline Phosphatase: 92 U/L (ref 39–117)
CO2: 26 mEq/L (ref 19–32)
Calcium: 9.6 mg/dL (ref 8.4–10.5)
GFR calc Af Amer: 62 mL/min — ABNORMAL LOW (ref >90)
GFR calc non Af Amer: 54 mL/min — ABNORMAL LOW (ref >90)
Glucose, Bld: 96 mg/dL (ref 70–99)
Potassium: 3.9 mEq/L (ref 3.5–5.1)
Sodium: 137 mEq/L (ref 135–145)
Total Bilirubin: 0.6 mg/dL (ref 0.3–1.2)
Total Protein: 7.9 g/dL (ref 6.0–8.3)

## 2013-06-02 LAB — CBC WITH DIFFERENTIAL/PLATELET
Basophils Absolute: 0 10*3/uL (ref 0.0–0.1)
Eosinophils Absolute: 0 10*3/uL (ref 0.0–0.7)
Eosinophils Relative: 0 % (ref 0–5)
Lymphocytes Relative: 11 % — ABNORMAL LOW (ref 12–46)
Lymphs Abs: 0.7 10*3/uL (ref 0.7–4.0)
MCH: 27.5 pg (ref 26.0–34.0)
MCHC: 32 g/dL (ref 30.0–36.0)
Neutrophils Relative %: 79 % — ABNORMAL HIGH (ref 43–77)
Platelets: 144 10*3/uL — ABNORMAL LOW (ref 150–400)
RBC: 4.33 MIL/uL (ref 3.87–5.11)
RDW: 15.2 % (ref 11.5–15.5)
WBC: 6.3 10*3/uL (ref 4.0–10.5)

## 2013-06-02 LAB — URINALYSIS, ROUTINE W REFLEX MICROSCOPIC
Bilirubin Urine: NEGATIVE
Ketones, ur: NEGATIVE mg/dL
Specific Gravity, Urine: 1.025 (ref 1.005–1.030)
Urobilinogen, UA: 0.2 mg/dL (ref 0.0–1.0)
pH: 6.5 (ref 5.0–8.0)

## 2013-06-02 LAB — URINE MICROSCOPIC-ADD ON

## 2013-06-02 NOTE — ED Notes (Signed)
Family in with pt 

## 2013-06-02 NOTE — ED Provider Notes (Signed)
CSN: [id number]     Arrival date & time 06/02/13  2123 History   First MD Initiated Contact with Patient 06/02/13 2129  This chart was scribed for Ward Givens, MD by Valera Castle, ED Scribe. This patient was seen in room APA18/AP[facility]18 and the patient's care was started at 10:09 PM.     Chief Complaint  Patient presents with  . Weakness  . Altered Mental Status   The history is provided by the patient and a relative. No language interpreter was used.   HPI Comments: [name] [name] is a 79 y.o. female brought in by her grand-nephew, who is her caregiver who presents to the Emergency Department complaining of  intermittent confusion/altered mental status, onset last night. He reports that last nightwhen she came for dinner, she only had her shirt on and no diaper and that was unusual for her. He states that she was talking normally last night, was just naked from the waist down. He states a family member called today and when he told her to pick up the phone she said she did however she hadn't. He states he went to check to see why she hadn't picked up the phone and she thought a heating pad control was a phone this morning. She was talking to some family members that weren't there earlier this morning.   Her grand-nephew reports that his mother's father is the pt's brother. He states he has been taking care of her for a while. He denies the pt having h/o altered mental status, stating normally she is on point, remembering things and having conversations. He reports that she has worn pampers for a while. He reports she complains of her neck. He also states pt has odor to her breath. He denies the pt having fever, cough, diarrhea, and any other associated symptoms. He denies the pt having h/o smoking.   PCP - Milinda Antis, MD   Past Medical History  Diagnosis Date  . GERD (gastroesophageal reflux disease)   . Complete rupture of rotator cuff   . Pain in joint, shoulder region   . Chronic  kidney disease, stage I   . Iron deficiency anemia, unspecified   . Bereavement, uncomplicated(V62.82)   . Edema   . Hyperglycemia   . Unspecified constipation   . Osteoporosis, unspecified   . OA (osteoarthritis)   . Unspecified essential hypertension   . Other and unspecified hyperlipidemia   . Diverticulosis of colon (without mention of hemorrhage)   . Herpes simplex keratitis 2011   Past Surgical History  Procedure Laterality Date  . Abdominal hysterectomy    . Benign cyst removal from lateral chest    . Eye surgery      cataract   Family History  Problem Relation Age of Onset  . Stroke Father   . Dementia Mother   . Dementia Sister   . Cancer Brother     throat   . Nephrolithiasis Brother    History  Substance Use Topics  . Smoking status: Former Games developer  . Smokeless tobacco: Not on file  . Alcohol Use: No   Lives at home Lives with great nephew   OB History   Grav Para Term Preterm Abortions TAB SAB Ect Mult Living                 Review of Systems  Constitutional: Negative for fever.  Respiratory: Negative for cough.   Gastrointestinal: Negative for diarrhea.  Musculoskeletal: Positive for neck stiffness.  Psychiatric/Behavioral: Positive for confusion. Negative for self-injury.  All other systems reviewed and are negative.    Allergies  Review of patient's allergies indicates no known allergies.  Home Medications   Current Outpatient Rx  Name  Route  Sig  Dispense  Refill  . acetaminophen (TYLENOL) 500 MG tablet   Oral   Take 500-1,000 mg by mouth every 6 (six) hours as needed.         Marland Kitchen aspirin EC 81 MG tablet   Oral   Take 81 mg by mouth daily.         . benazepril (LOTENSIN) 20 MG tablet   Oral   Take 20 mg by mouth daily.         . pravastatin (PRAVACHOL) 20 MG tablet   Oral   Take 20 mg by mouth at bedtime.         . traMADol (ULTRAM) 50 MG tablet   Oral   Take 50 mg by mouth every 6 (six) hours as needed for moderate  pain or severe pain.          BP 189/92  Pulse 52  Temp(Src) 99.1 F (37.3 C)  Resp 20  Wt 119 lb (53.978 kg)  SpO2 100%  Vital signs normal except bradycardia   Physical Exam  Nursing note and vitals reviewed. Constitutional: She is oriented to person, place, and time. She appears well-developed and well-nourished.  Non-toxic appearance. She does not appear ill. No distress.  HENT:  Head: Normocephalic and atraumatic.  Right Ear: External ear normal.  Left Ear: External ear normal.  Nose: Nose normal. No mucosal edema or rhinorrhea.  Mouth/Throat: Oropharynx is clear and moist and mucous membranes are normal. No dental abscesses or uvula swelling.  Eyes: Conjunctivae and EOM are normal. Pupils are equal, round, and reactive to light.  Neck: Normal range of motion and full passive range of motion without pain. Neck supple.  Cardiovascular: Regular rhythm and normal heart sounds.  Exam reveals no gallop and no friction rub.   No murmur heard. Irregular heart beat.  Pulmonary/Chest: Effort normal and breath sounds normal. No respiratory distress. She has no wheezes. She has no rhonchi. She has no rales. She exhibits no tenderness and no crepitus.  Abdominal: Soft. Normal appearance and bowel sounds are normal. She exhibits no distension. There is no tenderness. There is no rebound and no guarding.  Musculoskeletal: Normal range of motion. She exhibits no edema and no tenderness.  Moves all extremities well. Swan neck deformity of fingers and arthritic changes of MCP's of fingers.   Neurological: She is alert and oriented to person, place, and time. She has normal strength. No cranial nerve deficit.  Pt will not cooperate upon exam.  Skin: Skin is warm, dry and intact. No rash noted. No erythema. No pallor.  Psychiatric: She has a normal mood and affect. Her speech is normal and behavior is normal. Her mood appears not anxious.    ED Course  Procedures (including critical care  time)  Medications  cefTRIAXone (ROCEPHIN) 1 g in dextrose 5 % 50 mL IVPB (not administered)     DIAGNOSTIC STUDIES: Oxygen Saturation is 100% on room air, normal by my interpretation.    COORDINATION OF CARE: 10:20 PM-Discussed treatment plan which includes CT, CXR, UA, cardiac monitoring, and blood work with pt at bedside and pt agreed to plan.   Great-nephew states he is home all the time and can watch the patient. He states she takes her  medication without problems. He states he feels like he can take care of her at home. They can return if things change such as fever, vomiting or her refusing to take her medication or if she becomes difficult to handle at home. Patient has been pleasant in the ED although she would not follow commands for neurological exam.  Labs Review Results for orders placed during the hospital encounter of 06/02/13  CBC WITH DIFFERENTIAL      Result Value Range   WBC 6.3  4.0 - 10.5 K/uL   RBC 4.33  3.87 - 5.11 MIL/uL   Hemoglobin 11.9 (*) 12.0 - 15.0 g/dL   HCT 40.9  81.1 - 91.4 %   MCV 85.9  78.0 - 100.0 fL   MCH 27.5  26.0 - 34.0 pg   MCHC 32.0  30.0 - 36.0 g/dL   RDW 78.2  95.6 - 21.3 %   Platelets 144 (*) 150 - 400 K/uL   Neutrophils Relative % 79 (*) 43 - 77 %   Neutro Abs 5.0  1.7 - 7.7 K/uL   Lymphocytes Relative 11 (*) 12 - 46 %   Lymphs Abs 0.7  0.7 - 4.0 K/uL   Monocytes Relative 9  3 - 12 %   Monocytes Absolute 0.6  0.1 - 1.0 K/uL   Eosinophils Relative 0  0 - 5 %   Eosinophils Absolute 0.0  0.0 - 0.7 K/uL   Basophils Relative 0  0 - 1 %   Basophils Absolute 0.0  0.0 - 0.1 K/uL  COMPREHENSIVE METABOLIC PANEL      Result Value Range   Sodium 137  135 - 145 mEq/L   Potassium 3.9  3.5 - 5.1 mEq/L   Chloride 97  96 - 112 mEq/L   CO2 26  19 - 32 mEq/L   Glucose, Bld 96  70 - 99 mg/dL   BUN 16  6 - 23 mg/dL   Creatinine, Ser 0.86  0.50 - 1.10 mg/dL   Calcium 9.6  8.4 - 57.8 mg/dL   Total Protein 7.9  6.0 - 8.3 g/dL   Albumin 4.0  3.5 -  5.2 g/dL   AST 10  0 - 37 U/L   ALT 6  0 - 35 U/L   Alkaline Phosphatase 92  39 - 117 U/L   Total Bilirubin 0.6  0.3 - 1.2 mg/dL   GFR calc non Af Amer 54 (*) >90 mL/min   GFR calc Af Amer 62 (*) >90 mL/min  URINALYSIS, ROUTINE W REFLEX MICROSCOPIC      Result Value Range   Color, Urine YELLOW  YELLOW   APPearance CLEAR  CLEAR   Specific Gravity, Urine 1.025  1.005 - 1.030   pH 6.5  5.0 - 8.0   Glucose, UA NEGATIVE  NEGATIVE mg/dL   Hgb urine dipstick SMALL (*) NEGATIVE   Bilirubin Urine NEGATIVE  NEGATIVE   Ketones, ur NEGATIVE  NEGATIVE mg/dL   Protein, ur 469 (*) NEGATIVE mg/dL   Urobilinogen, UA 0.2  0.0 - 1.0 mg/dL   Nitrite POSITIVE (*) NEGATIVE   Leukocytes, UA TRACE (*) NEGATIVE  TROPONIN I      Result Value Range   Troponin I <0.30  <0.30 ng/mL  URINE MICROSCOPIC-ADD ON      Result Value Range   Squamous Epithelial / LPF FEW (*) RARE   WBC, UA TOO NUMEROUS TO COUNT  <3 WBC/hpf   RBC / HPF 3-6  <3 RBC/hpf   Bacteria,  UA MANY (*) RARE   Laboratory interpretation all normal except UTI   Imaging Review Dg Chest 1 View  06/03/2013   CLINICAL DATA:  Altered mental status  EXAM: CHEST - 1 VIEW  COMPARISON:  02/13/2011 thoracic spine study.  FINDINGS: Chronic cardiomegaly. Tortuous aorta with extensive proximal and arch atherosclerosis. A rounded density overlapping the right lower chest is unchanged from 2012, likely diaphragm eventration. No infiltrate or edema. No effusion or pneumothorax.  IMPRESSION: 1. No evidence of pneumonia. 2. Cardiomegaly and aortic tortuosity or ectasia.   Electronically Signed   By: Tiburcio Pea M.D.   On: 06/03/2013 00:37   Ct Head Wo Contrast  06/02/2013   CLINICAL DATA:  Altered mental status  EXAM: CT HEAD WITHOUT CONTRAST  TECHNIQUE: Contiguous axial images were obtained from the base of the skull through the vertex without intravenous contrast.  COMPARISON:  Prior study from 02/13/2011  FINDINGS: Atrophy with chronic microvascular  ischemic changes are similar as compared to the previous examination.  There is no acute intracranial hemorrhage or infarct. No mass lesion or midline shift. Gray-white matter differentiation is well maintained. Ventricles are normal in size without evidence of hydrocephalus. Basal ganglia calcifications are noted. Prominent vascular calcifications present within the carotid arteries and proximal middle cerebral arteries bilaterally.  No extra-axial fluid collection.  The calvarium is intact. Orbital soft tissues are within normal limits.  The paranasal sinuses and mastoid air cells are well pneumatized and free of fluid.  Scalp soft tissues are unremarkable.  IMPRESSION: 1. No acute intracranial process. 2. Atrophy with chronic microvascular ischemic disease, similar as compared to prior study.   Electronically Signed   By: Rise Mu M.D.   On: 06/02/2013 23:39    EKG Interpretation    Date/Time:  Friday June 02 2013 22:25:05 EST Ventricular Rate:  60 PR Interval:  146 QRS Duration: 88 QT Interval:  438 QTC Calculation: 438 R Axis:   -7 Text Interpretation:  Sinus rhythm with Premature atrial complexes Septal infarct , age undetermined Left ventricular hypertrophy ST \\T \ T wave abnormality, consider lateral ischemia When compared with ECG of 13-Feb-2011 20:20, Premature atrial complexes are now Present Septal infarct is now Present Non-specific change in ST segment in Anterior leads T wave inversion now evident in Inferior leads T wave inversion now evident in Lateral leads T wave inversion consistant with progression of her LVH with strain pattern Confirmed by Frankey Botting  MD-I, Windel Keziah ([id number]) on 06/03/2013 12:42:56 AM             MDM  pleasant elderly female with gradual onset of confusion over the last 24 hours with urinary tract infection. EKG was done due to irregular heart rate when I examined her. She does have PACs. She appears to have progression of her LVH and now has a strain  pattern. Her troponin was negative.    1. UTI (urinary tract infection)   2. Confusion    New Prescriptions   CEPHALEXIN (KEFLEX) 500 MG CAPSULE    Take 1 capsule (500 mg total) by mouth 3 (three) times daily.    Plan discharge   Devoria Albe, MD, FACEP   I personally performed the services described in this documentation, which was scribed in my presence. The recorded information has been reviewed and considered.  Devoria Albe, MD, FACEP    Ward Givens, MD 06/03/13 [phone]

## 2013-06-02 NOTE — ED Notes (Addendum)
EMS states pt has had some altered mental status today per family and family also said that they smell a strong odor when she urinates. Family also states that pt has not been getting around good today.

## 2013-06-03 MED ORDER — DEXTROSE 5 % IV SOLN
1.0000 g | Freq: Once | INTRAVENOUS | Status: AC
  Administered 2013-06-03: 1 g via INTRAVENOUS
  Filled 2013-06-03: qty 10

## 2013-06-03 MED ORDER — CEPHALEXIN 500 MG PO CAPS: 500.0000 mg | ORAL_CAPSULE | Freq: Three times a day (TID) | ORAL | Status: DC

## 2013-06-05 LAB — URINE CULTURE: Colony Count: >=100000

## 2013-08-07 ENCOUNTER — Ambulatory Visit (INDEPENDENT_AMBULATORY_CARE_PROVIDER_SITE_OTHER): Payer: Medicare Other | Admitting: Family Medicine

## 2013-08-07 ENCOUNTER — Encounter: Payer: Self-pay | Admitting: Family Medicine

## 2013-08-07 VITALS — BP 136/72 | HR 68 | Temp 98.1°F | Resp 18 | Ht 59.5 in | Wt 116.0 lb

## 2013-08-07 DIAGNOSIS — M069 Rheumatoid arthritis, unspecified: Secondary | ICD-10-CM

## 2013-08-07 DIAGNOSIS — I10 Essential (primary) hypertension: Secondary | ICD-10-CM

## 2013-08-07 DIAGNOSIS — N390 Urinary tract infection, site not specified: Secondary | ICD-10-CM

## 2013-08-07 DIAGNOSIS — R269 Unspecified abnormalities of gait and mobility: Secondary | ICD-10-CM

## 2013-08-07 DIAGNOSIS — I498 Other specified cardiac arrhythmias: Secondary | ICD-10-CM

## 2013-08-07 DIAGNOSIS — Z23 Encounter for immunization: Secondary | ICD-10-CM

## 2013-08-07 DIAGNOSIS — R2681 Unsteadiness on feet: Secondary | ICD-10-CM

## 2013-08-07 DIAGNOSIS — E785 Hyperlipidemia, unspecified: Secondary | ICD-10-CM

## 2013-08-07 LAB — URINALYSIS, MICROSCOPIC ONLY
CASTS: NONE SEEN
Crystals: NONE SEEN

## 2013-08-07 LAB — URINALYSIS, ROUTINE W REFLEX MICROSCOPIC
Glucose, UA: NEGATIVE mg/dL
Ketones, ur: NEGATIVE mg/dL
NITRITE: POSITIVE — AB
Protein, ur: 100 mg/dL — AB
Specific Gravity, Urine: 1.017 (ref 1.005–1.030)
Urobilinogen, UA: 1 mg/dL (ref 0.0–1.0)
pH: 6 (ref 5.0–8.0)

## 2013-08-07 NOTE — Assessment & Plan Note (Signed)
Will check lipids, she is not fasting today, I will see if we need to continue statin drug

## 2013-08-07 NOTE — Progress Notes (Signed)
Patient ID: [name] [name], female   DOB: 20-Feb-1921, 80 y.o.   MRN: [id number]   Subjective:    Patient ID: [name] [name], female    DOB: 1921/04/05, 80 y.o.   MRN: [id number]  Patient presents for Annual Exam and F/U UTI  patient here to followup she's not been seen in one year and when necessary because her medications could not be refills. She's been living at home with her nephew Leanna Sato who cares for her typically 24 hours a day. She was sent to the emergency room back in December secondary to urinary tract infection with symptoms of confusion and delirium. She was treated with antibiotics. Her nephew is very concerned about her having recurrent infections and would like to have her urine checked again today. She's not had any dysuria but did not have the symptoms last time she also has not had any fever her urine has been a little stronger. She's been taking her blood pressure and cholesterol medications as prescribed without any difficulties and she's also still following with orthopedics for her severe osteoarthritis and she is getting the injections every few months. He typically gives her Tylenol on a daily basis for pain but reserves the tramadol for severe pain. She does not have any specific concerns today  She would like to have the pneumonia Prevnar shot    Review Of Systems:  GEN- denies fatigue, fever, weight loss,weakness, recent illness HEENT- denies eye drainage, change in vision, nasal discharge, CVS- denies chest pain, palpitations RESP- denies SOB, cough, wheeze ABD- denies N/V, change in stools, abd pain GU- denies dysuria, hematuria, dribbling, incontinence MSK- +joint pain, muscle aches, injury Neuro- denies headache, dizziness, syncope, seizure activity       Objective:    BP 136/72  Pulse 68  Temp(Src) 98.1 F (36.7 C)  Resp 18  Ht 4' 11.5" (1.511 m)  Wt 116 lb (52.617 kg)  BMI 23.05 kg/m2 GEN- NAD, alert and oriented x3 HEENT- PERRL, EOMI,  non injected sclera, pink conjunctiva, MMM, oropharynx clear Neck- Supple, no thyromegaly CVS- irregular rhythem, normal rate, no murmur RESP-CTAB ABD-NABS,soft,NT,ND, no CVA tenderness MSK- Decreased ROM HIPS, Shoulders, hands, Knees- contractures of hands EXT- No edema Pulses- Radial, DP- 2+  EKG- sinus arrythmia, PAC,compared to Dec 2014       Assessment & Plan:      Problem List Items Addressed This Visit   UTI (urinary tract infection)     Recent urinary tract infection with confusion. I will recheck her urinalysis at the family's request as well as culture to see if this is cleared    Relevant Orders      Urinalysis, Routine w reflex microscopic (Completed)      Urine culture   Rheumatoid arthritis (Chronic)     Severe rheumatoid arthritis as well as osteoporosis in her knees we will continue the tramadol as needed her nephew does not give this unless severe otherwise give her Tylenol    Hypertension - Primary (Chronic)     Continue lotensin Check labs     Relevant Orders      Comprehensive metabolic panel      CBC with Differential   HYPERLIPIDEMIA     Will check lipids, she is not fasting today, I will see if we need to continue statin drug    Relevant Orders      Lipid panel   Gait instability     She has a wheelchair as well as walker and someone with  her typically 24 hours a day     Other Visit Diagnoses   Need for prophylactic vaccination against Streptococcus pneumoniae (pneumococcus)        Relevant Orders       Pneumococcal conjugate vaccine 13-valent (Completed)       Note: This dictation was prepared with Dragon dictation along with smaller phrase technology. Any transcriptional errors that result from this process are unintentional.

## 2013-08-07 NOTE — Patient Instructions (Addendum)
We will call with lab results Continue current medications for now Prevnar 13 given- pneumonia booster F/U 4 months

## 2013-08-07 NOTE — Assessment & Plan Note (Signed)
Continue lotensin Check labs

## 2013-08-07 NOTE — Assessment & Plan Note (Signed)
She has a wheelchair as well as walker and someone with her typically 24 hours a day

## 2013-08-07 NOTE — Assessment & Plan Note (Signed)
Recent urinary tract infection with confusion. I will recheck her urinalysis at the family's request as well as culture to see if this is cleared

## 2013-08-07 NOTE — Assessment & Plan Note (Signed)
Severe rheumatoid arthritis as well as osteoporosis in her knees we will continue the tramadol as needed her nephew does not give this unless severe otherwise give her Tylenol

## 2013-08-08 LAB — CBC WITH DIFFERENTIAL/PLATELET
BASOS ABS: 0.1 10*3/uL (ref 0.0–0.1)
Basophils Relative: 1 % (ref 0–1)
Eosinophils Absolute: 0.1 10*3/uL (ref 0.0–0.7)
Eosinophils Relative: 1 % (ref 0–5)
HEMATOCRIT: 36.6 % (ref 36.0–46.0)
HEMOGLOBIN: 11.4 g/dL — AB (ref 12.0–15.0)
LYMPHS PCT: 22 % (ref 12–46)
Lymphs Abs: 1.2 10*3/uL (ref 0.7–4.0)
MCH: 25.8 pg — ABNORMAL LOW (ref 26.0–34.0)
MCHC: 31.1 g/dL (ref 30.0–36.0)
MCV: 82.8 fL (ref 78.0–100.0)
Monocytes Absolute: 0.5 10*3/uL (ref 0.1–1.0)
Monocytes Relative: 10 % (ref 3–12)
NEUTROS ABS: 3.5 10*3/uL (ref 1.7–7.7)
NEUTROS PCT: 66 % (ref 43–77)
Platelets: 222 10*3/uL (ref 150–400)
RBC: 4.42 MIL/uL (ref 3.87–5.11)
RDW: 16 % — AB (ref 11.5–15.5)
WBC: 5.3 10*3/uL (ref 4.0–10.5)

## 2013-08-08 LAB — COMPREHENSIVE METABOLIC PANEL
ALT: 11 U/L (ref 0–35)
AST: 17 U/L (ref 0–37)
Albumin: 4.2 g/dL (ref 3.5–5.2)
Alkaline Phosphatase: 68 U/L (ref 39–117)
BUN: 15 mg/dL (ref 6–23)
CHLORIDE: 101 meq/L (ref 96–112)
CO2: 26 meq/L (ref 19–32)
CREATININE: 0.99 mg/dL (ref 0.50–1.10)
Calcium: 9 mg/dL (ref 8.4–10.5)
Glucose, Bld: 79 mg/dL (ref 70–99)
Potassium: 3.6 mEq/L (ref 3.5–5.3)
Sodium: 141 mEq/L (ref 135–145)
Total Bilirubin: 0.5 mg/dL (ref 0.2–1.2)
Total Protein: 7.2 g/dL (ref 6.0–8.3)

## 2013-08-08 LAB — LIPID PANEL
CHOLESTEROL: 134 mg/dL (ref 0–200)
HDL: 37 mg/dL — AB (ref >39)
LDL CALC: 75 mg/dL (ref 0–99)
TRIGLYCERIDES: 112 mg/dL (ref <150)
Total CHOL/HDL Ratio: 3.6 Ratio
VLDL: 22 mg/dL (ref 0–40)

## 2013-08-09 ENCOUNTER — Other Ambulatory Visit: Payer: Self-pay | Admitting: *Deleted

## 2013-08-09 MED ORDER — CIPROFLOXACIN HCL 500 MG PO TABS: 500.0000 mg | ORAL_TABLET | Freq: Two times a day (BID) | ORAL | Status: DC

## 2013-08-09 MED ORDER — BENAZEPRIL HCL 20 MG PO TABS: 20.0000 mg | ORAL_TABLET | Freq: Every day | ORAL | Status: DC

## 2013-08-09 NOTE — Progress Notes (Signed)
Call placed to Leanna Sato, patient nephew and caretaker to make aware. Verbalized understanding.

## 2013-08-09 NOTE — Addendum Note (Signed)
Addended by: Vic Blackbird F on: 08/09/2013 03:57 PM   Modules accepted: Orders

## 2013-08-09 NOTE — Telephone Encounter (Signed)
Refill appropriate and filled per protocol. 

## 2013-08-10 LAB — URINE CULTURE: Colony Count: >=100000

## 2013-08-15 ENCOUNTER — Ambulatory Visit: Payer: Medicare Other | Admitting: Orthopedic Surgery

## 2013-08-28 ENCOUNTER — Other Ambulatory Visit: Payer: Self-pay | Admitting: *Deleted

## 2013-08-28 MED ORDER — PRAVASTATIN SODIUM 20 MG PO TABS: 20.0000 mg | ORAL_TABLET | Freq: Every day | ORAL | Status: DC

## 2013-08-28 NOTE — Telephone Encounter (Signed)
Refill appropriate and filled per protocol. 

## 2013-09-13 ENCOUNTER — Encounter: Payer: Self-pay | Admitting: Family Medicine

## 2013-10-20 ENCOUNTER — Telehealth: Payer: Self-pay | Admitting: Family Medicine

## 2013-10-20 MED ORDER — CIPROFLOXACIN HCL 500 MG PO TABS: 500.0000 mg | ORAL_TABLET | Freq: Two times a day (BID) | ORAL | Status: DC

## 2013-10-20 NOTE — Telephone Encounter (Signed)
Call placed to patient and patient nephew Leanna Sato made aware.

## 2013-10-20 NOTE — Telephone Encounter (Signed)
[phone] Pt is needing a refill on ciprofloxacin (CIPRO) 500 MG tablet Pharmacy [facility]

## 2013-10-20 NOTE — Telephone Encounter (Signed)
Call placed to patient nephew.   Reports that patient has increased confusion and urine odor is foul.   Requested to have ABTx refilled.   MD please advise.

## 2013-10-20 NOTE — Telephone Encounter (Signed)
Okay to refill, bring her in , if she does not clear up or go to ER Cipro 500mg  BID x 7 days

## 2013-12-05 ENCOUNTER — Ambulatory Visit: Payer: Medicare Other | Admitting: Family Medicine

## 2013-12-20 ENCOUNTER — Emergency Department (HOSPITAL_COMMUNITY): Payer: Medicare Other

## 2013-12-20 ENCOUNTER — Inpatient Hospital Stay (HOSPITAL_COMMUNITY)
Admission: EM | Admit: 2013-12-20 | Discharge: 2013-12-23 | DRG: 690 | Disposition: A | Discharge status: Skilled Nursing Facility | Payer: Medicare Other | Attending: Family Medicine | Admitting: Family Medicine

## 2013-12-20 ENCOUNTER — Ambulatory Visit (INDEPENDENT_AMBULATORY_CARE_PROVIDER_SITE_OTHER): Payer: Medicare Other | Admitting: Family Medicine

## 2013-12-20 ENCOUNTER — Ambulatory Visit: Payer: Self-pay | Admitting: Family Medicine

## 2013-12-20 ENCOUNTER — Encounter: Payer: Self-pay | Admitting: Family Medicine

## 2013-12-20 ENCOUNTER — Encounter (HOSPITAL_COMMUNITY): Payer: Self-pay | Admitting: Emergency Medicine

## 2013-12-20 VITALS — BP 130/62 | HR 78 | Temp 98.9°F | Resp 16

## 2013-12-20 DIAGNOSIS — D376 Neoplasm of uncertain behavior of liver, gallbladder and bile ducts: Secondary | ICD-10-CM | POA: Diagnosis present

## 2013-12-20 DIAGNOSIS — M25552 Pain in left hip: Secondary | ICD-10-CM

## 2013-12-20 DIAGNOSIS — R2681 Unsteadiness on feet: Secondary | ICD-10-CM

## 2013-12-20 DIAGNOSIS — W19XXXA Unspecified fall, initial encounter: Secondary | ICD-10-CM

## 2013-12-20 DIAGNOSIS — K219 Gastro-esophageal reflux disease without esophagitis: Secondary | ICD-10-CM | POA: Diagnosis present

## 2013-12-20 DIAGNOSIS — D638 Anemia in other chronic diseases classified elsewhere: Secondary | ICD-10-CM | POA: Diagnosis present

## 2013-12-20 DIAGNOSIS — N3000 Acute cystitis without hematuria: Secondary | ICD-10-CM

## 2013-12-20 DIAGNOSIS — M545 Low back pain, unspecified: Secondary | ICD-10-CM | POA: Diagnosis present

## 2013-12-20 DIAGNOSIS — R269 Unspecified abnormalities of gait and mobility: Secondary | ICD-10-CM

## 2013-12-20 DIAGNOSIS — D696 Thrombocytopenia, unspecified: Secondary | ICD-10-CM | POA: Diagnosis present

## 2013-12-20 DIAGNOSIS — I129 Hypertensive chronic kidney disease with stage 1 through stage 4 chronic kidney disease, or unspecified chronic kidney disease: Secondary | ICD-10-CM | POA: Diagnosis present

## 2013-12-20 DIAGNOSIS — N12 Tubulo-interstitial nephritis, not specified as acute or chronic: Principal | ICD-10-CM | POA: Diagnosis present

## 2013-12-20 DIAGNOSIS — M810 Age-related osteoporosis without current pathological fracture: Secondary | ICD-10-CM | POA: Diagnosis present

## 2013-12-20 DIAGNOSIS — Z993 Dependence on wheelchair: Secondary | ICD-10-CM

## 2013-12-20 DIAGNOSIS — I482 Chronic atrial fibrillation, unspecified: Secondary | ICD-10-CM

## 2013-12-20 DIAGNOSIS — N170 Acute kidney failure with tubular necrosis: Secondary | ICD-10-CM | POA: Diagnosis present

## 2013-12-20 DIAGNOSIS — N390 Urinary tract infection, site not specified: Secondary | ICD-10-CM

## 2013-12-20 DIAGNOSIS — K769 Liver disease, unspecified: Secondary | ICD-10-CM | POA: Diagnosis present

## 2013-12-20 DIAGNOSIS — R531 Weakness: Secondary | ICD-10-CM | POA: Insufficient documentation

## 2013-12-20 DIAGNOSIS — K5900 Constipation, unspecified: Secondary | ICD-10-CM | POA: Diagnosis present

## 2013-12-20 DIAGNOSIS — Z823 Family history of stroke: Secondary | ICD-10-CM

## 2013-12-20 DIAGNOSIS — G8929 Other chronic pain: Secondary | ICD-10-CM | POA: Diagnosis present

## 2013-12-20 DIAGNOSIS — M25559 Pain in unspecified hip: Secondary | ICD-10-CM | POA: Diagnosis present

## 2013-12-20 DIAGNOSIS — I498 Other specified cardiac arrhythmias: Secondary | ICD-10-CM

## 2013-12-20 DIAGNOSIS — R5381 Other malaise: Secondary | ICD-10-CM

## 2013-12-20 DIAGNOSIS — E785 Hyperlipidemia, unspecified: Secondary | ICD-10-CM | POA: Diagnosis present

## 2013-12-20 DIAGNOSIS — R627 Adult failure to thrive: Secondary | ICD-10-CM | POA: Diagnosis present

## 2013-12-20 DIAGNOSIS — Z87891 Personal history of nicotine dependence: Secondary | ICD-10-CM

## 2013-12-20 DIAGNOSIS — M87059 Idiopathic aseptic necrosis of unspecified femur: Secondary | ICD-10-CM | POA: Diagnosis present

## 2013-12-20 DIAGNOSIS — I4891 Unspecified atrial fibrillation: Secondary | ICD-10-CM | POA: Diagnosis present

## 2013-12-20 DIAGNOSIS — M069 Rheumatoid arthritis, unspecified: Secondary | ICD-10-CM | POA: Diagnosis present

## 2013-12-20 DIAGNOSIS — R1310 Dysphagia, unspecified: Secondary | ICD-10-CM | POA: Diagnosis present

## 2013-12-20 DIAGNOSIS — I10 Essential (primary) hypertension: Secondary | ICD-10-CM

## 2013-12-20 DIAGNOSIS — E440 Moderate protein-calorie malnutrition: Secondary | ICD-10-CM | POA: Diagnosis present

## 2013-12-20 DIAGNOSIS — N182 Chronic kidney disease, stage 2 (mild): Secondary | ICD-10-CM | POA: Diagnosis present

## 2013-12-20 DIAGNOSIS — R5383 Other fatigue: Secondary | ICD-10-CM

## 2013-12-20 LAB — COMPREHENSIVE METABOLIC PANEL
ALT: 8 U/L (ref 0–35)
AST: 17 U/L (ref 0–37)
Albumin: 3.7 g/dL (ref 3.5–5.2)
Alkaline Phosphatase: 77 U/L (ref 39–117)
Anion gap: 17 — ABNORMAL HIGH (ref 5–15)
BUN: 42 mg/dL — ABNORMAL HIGH (ref 6–23)
CALCIUM: 9.4 mg/dL (ref 8.4–10.5)
CHLORIDE: 99 meq/L (ref 96–112)
CO2: 23 mEq/L (ref 19–32)
CREATININE: 1.24 mg/dL — AB (ref 0.50–1.10)
GFR calc non Af Amer: 37 mL/min — ABNORMAL LOW (ref >90)
GFR, EST AFRICAN AMERICAN: 43 mL/min — AB (ref >90)
GLUCOSE: 108 mg/dL — AB (ref 70–99)
Potassium: 4 mEq/L (ref 3.7–5.3)
Sodium: 139 mEq/L (ref 137–147)
Total Bilirubin: 0.6 mg/dL (ref 0.3–1.2)
Total Protein: 7.6 g/dL (ref 6.0–8.3)

## 2013-12-20 LAB — URINALYSIS, MICROSCOPIC ONLY
Casts: NONE SEEN
Crystals: NONE SEEN

## 2013-12-20 LAB — URINALYSIS, ROUTINE W REFLEX MICROSCOPIC
Bilirubin Urine: NEGATIVE
GLUCOSE, UA: NEGATIVE mg/dL
Glucose, UA: NEGATIVE mg/dL
Ketones, ur: 15 mg/dL — AB
LEUKOCYTES UA: NEGATIVE
NITRITE: NEGATIVE
Nitrite: POSITIVE — AB
Protein, ur: >300 mg/dL — AB
Protein, ur: 100 mg/dL — AB
SPECIFIC GRAVITY, URINE: 1.025 (ref 1.005–1.030)
Specific Gravity, Urine: 1.025 (ref 1.005–1.030)
UROBILINOGEN UA: 1 mg/dL (ref 0.0–1.0)
UROBILINOGEN UA: 0.2 mg/dL (ref 0.0–1.0)
pH: 6 (ref 5.0–8.0)
pH: 5.5 (ref 5.0–8.0)

## 2013-12-20 LAB — CBC WITH DIFFERENTIAL/PLATELET
BASOS PCT: 0 % (ref 0–1)
Basophils Absolute: 0 10*3/uL (ref 0.0–0.1)
EOS PCT: 0 % (ref 0–5)
Eosinophils Absolute: 0 10*3/uL (ref 0.0–0.7)
HEMATOCRIT: 36.6 % (ref 36.0–46.0)
Hemoglobin: 11.9 g/dL — ABNORMAL LOW (ref 12.0–15.0)
Lymphocytes Relative: 10 % — ABNORMAL LOW (ref 12–46)
Lymphs Abs: 0.8 10*3/uL (ref 0.7–4.0)
MCH: 26.9 pg (ref 26.0–34.0)
MCHC: 32.5 g/dL (ref 30.0–36.0)
MCV: 82.8 fL (ref 78.0–100.0)
MONO ABS: 0.7 10*3/uL (ref 0.1–1.0)
MONOS PCT: 9 % (ref 3–12)
NEUTROS ABS: 6.6 10*3/uL (ref 1.7–7.7)
Neutrophils Relative %: 81 % — ABNORMAL HIGH (ref 43–77)
Platelets: 148 10*3/uL — ABNORMAL LOW (ref 150–400)
RBC: 4.42 MIL/uL (ref 3.87–5.11)
RDW: 16 % — ABNORMAL HIGH (ref 11.5–15.5)
WBC: 8.1 10*3/uL (ref 4.0–10.5)

## 2013-12-20 LAB — TROPONIN I

## 2013-12-20 LAB — PRO B NATRIURETIC PEPTIDE: PRO B NATRI PEPTIDE: 1143 pg/mL — AB (ref 0–450)

## 2013-12-20 LAB — URINE MICROSCOPIC-ADD ON

## 2013-12-20 MED ORDER — MORPHINE SULFATE 4 MG/ML IJ SOLN
4.0000 mg | Freq: Once | INTRAMUSCULAR | Status: AC
  Administered 2013-12-20: 4 mg via INTRAVENOUS
  Filled 2013-12-20: qty 1

## 2013-12-20 MED ORDER — DEXTROSE 5 % IV SOLN
1.0000 g | Freq: Once | INTRAVENOUS | Status: AC
  Administered 2013-12-20: 1 g via INTRAVENOUS
  Filled 2013-12-20: qty 10

## 2013-12-20 MED ORDER — SODIUM CHLORIDE 0.9 % IV SOLN
INTRAVENOUS | Status: DC
  Administered 2013-12-20: 21:00:00 via INTRAVENOUS

## 2013-12-20 MED ORDER — SODIUM CHLORIDE 0.9 % IV BOLUS (SEPSIS)
500.0000 mL | Freq: Once | INTRAVENOUS | Status: AC
  Administered 2013-12-20: 500 mL via INTRAVENOUS

## 2013-12-20 NOTE — ED Notes (Signed)
Pt presently out of room

## 2013-12-20 NOTE — ED Notes (Signed)
Pt brought in from doctors office with hx of failure to thrive past week. Per nephew pt fell on Sunday and since then she has not been getting up oob, pt co lt hip pain, decreased po intake . And per EMS A Fib on monitor - new onset

## 2013-12-20 NOTE — Progress Notes (Signed)
Patient ID: [name] [name], female   DOB: [birth year], 80 y.o.   MRN: [id number]   Subjective:    Patient ID: [name] [name], female    DOB: 16-Mar-1921, 80 y.o.   MRN: [id number]  Patient presents for 4 month F/U, Fall and Weakness  Pt here for routione follow-up with newphew- Leanna Sato  she fell on Sunday while using her walker, since then she has been progressively weaker than normal she's not been getting out of the bed she's complaining of back and hip pain. Her appetite has been very poor, she states she's not had any shortness of breath or chest pain but does not feel good in general. She also complains of some dysuria over the past week as well.    Review Of Systems:  GEN- denies fatigue, fever, weight loss,weakness, recent illness HEENT- denies eye drainage, change in vision, nasal discharge, CVS- denies chest pain, palpitations RESP- denies SOB, cough, wheeze ABD- denies N/V, change in stools, abd pain GU- + dysuria, hematuria, dribbling, incontinence MSK-+joint pain, muscle aches, injury Neuro- denies headache, dizziness, syncope, seizure activity       Objective:    BP 130/62  Pulse 78  Temp(Src) 98.9 F (37.2 C) (Oral)  Resp 16 GEN- NAD, alert and oriented x3 HEENT- PERRL, EOMI, non injected sclera, pink conjunctiva, MMM, oropharynx clear Neck- Supple,  CVS- irregular rhythem , normal rate, no murmur RESP-CTAB ABD-NABS, soft, NT,ND GU- lichen planus, atrophy vaginal region,  MSK- Decreased ROM back, TTP lumbar spine, pain with ROM Left HIP ( more than baseline) EXT- No edema Pulses- Radial 2+  EKG- Sinus arrythmia, PAC, LVH       Assessment & Plan:      Problem List Items Addressed This Visit   Weakness   UTI (urinary tract infection)     History of recurrent infection, with age culture sent based on cath specimen    Fall - Primary     S/p fall, concern for hip fracture with pain,     Relevant Orders      Urinalysis, Routine w reflex  microscopic      Urine culture      Note: This dictation was prepared with Dragon dictation along with smaller phrase technology. Any transcriptional errors that result from this process are unintentional.

## 2013-12-20 NOTE — Assessment & Plan Note (Signed)
Concern for a fib with weakness, exam today, many PAC on exam , needs monitoring

## 2013-12-20 NOTE — Assessment & Plan Note (Signed)
Worsening pain from baseline, needs repeat xray after fall

## 2013-12-20 NOTE — Assessment & Plan Note (Signed)
S/p fall, concern for hip fracture with pain,

## 2013-12-20 NOTE — Assessment & Plan Note (Signed)
History of recurrent infection, with age culture sent based on cath specimen

## 2013-12-20 NOTE — H&P (Signed)
PATIENT DETAILS Name: [name] [name] Age: 80 y.o. Sex: female Date of Birth: 17-Dec-1920 Admit Date: 12/20/2013 [id number], Lonell Grandchild, MD   CHIEF COMPLAINT:  Follow-2 days back Low back pain-2 days Failure to thrive  HPI: [name] [name] is a 80 y.o. female with a Past Medical History of hypertension, rheumatoid arthritis, cavernous hemangioma of the liver who presents today with the above noted complaint. This elderly female this with her grandnephew, she is mostly independent in her daily activities requiring some minimal assistance from her family, 2 days back she apparently sustained a mechanical fall. Following which, patient has been nonambulatory. She is now totally dependent on family for activities of daily living. Patient has been complaining of worsening of her chronic pain. Patient is a very poor historian, does not localize the pain on repeated questioning. However her family-grand nephew-claims that even lifting her from the bed provokes the pain, even he cannot localize the pain to a particular worsening of the body. Because of persistent pain, failure to thrive like symptoms, patient went to see her primary care practitioner today, who promptly referred the patient to the ED for further evaluation. In the ED, a x-ray of the left hip was equivocal for a nondisplaced fracture, this was followed by a CT scan of the left hip, which was negative for acute fracture or dislocation. Further evaluation revealed a UTI. I was subsequently asked to admit this patient for further evaluation and treatment. There is no recent history of fever, vomiting, diarrhea. There is a history of abdominal pain. Patient had 2 episodes of "spitting" Sunday,but has been eating since then. No history of chest pain, shortness of breath. Patient has chronic pain syndrome from severe rheumatoid arthritis, and the only thing she complains today during my evaluation is of low back pain.  ALLERGIES:  No Known Allergies  PAST MEDICAL HISTORY: Past Medical History  Diagnosis Date  . GERD (gastroesophageal reflux disease)   . Complete rupture of rotator cuff   . Pain in joint, shoulder region   . Chronic kidney disease, stage I   . Iron deficiency anemia, unspecified   . Bereavement, uncomplicated   . Edema   . Hyperglycemia   . Unspecified constipation   . Osteoporosis, unspecified   . OA (osteoarthritis)   . Unspecified essential hypertension   . Other and unspecified hyperlipidemia   . Diverticulosis of colon (without mention of hemorrhage)   . Herpes simplex keratitis 2011    PAST SURGICAL HISTORY: Past Surgical History  Procedure Laterality Date  . Abdominal hysterectomy    . Benign cyst removal from lateral chest    . Eye surgery      cataract    MEDICATIONS AT HOME: Prior to Admission medications   Medication Sig Start Date End Date Taking? Authorizing Provider  acetaminophen (TYLENOL) 500 MG tablet Take 500-1,000 mg by mouth every 6 (six) hours as needed.   Yes Historical Provider, MD  aspirin EC 81 MG tablet Take 81 mg by mouth daily.   Yes Historical Provider, MD  benazepril (LOTENSIN) 20 MG tablet Take 1 tablet (20 mg total) by mouth daily. 08/09/13  Yes Alycia Rossetti, MD  pravastatin (PRAVACHOL) 20 MG tablet Take 1 tablet (20 mg total) by mouth at bedtime. 08/28/13  Yes Alycia Rossetti, MD  traMADol (ULTRAM) 50 MG tablet Take 50 mg by mouth every 6 (six) hours as needed for moderate pain or severe pain.   Yes Historical Provider,  MD    FAMILY HISTORY: Family History  Problem Relation Age of Onset  . Stroke Father   . Dementia Mother   . Dementia Sister   . Cancer Brother     throat   . Nephrolithiasis Brother     SOCIAL HISTORY:  reports that she has quit smoking. She does not have any smokeless tobacco history on file. She reports that she does not drink alcohol or use illicit drugs.  REVIEW OF SYSTEMS: Obtained from family Constitutional:   No   weight loss, night sweats,  Fevers  HEENT:    No headaches, Difficulty swallowing,Tooth/dental problems,Sore throat,   Cardio-vascular: No chest pain,  Orthopnea, PND, swelling in lower extremities  GI:  No heartburn,  abdominal pain, diarrhea  Resp: No shortness of breath with exertion or at rest.  No excess mucus, no productive cough, No non-productive cough,  No coughing up of blood.No change in color of mucus.No wheezing.No chest wall deformity  Skin:  no rash or lesions.  GU:  no dysuria, change in color of urine, no urgency or frequency.  Musculoskeletal: No joint pain or swelling.  No decreased range of motion.  No back pain.  Psych: No change in mood or affect. No depression or anxiety.  No memory loss.   PHYSICAL EXAM: Blood pressure 148/78, pulse 85, temperature 98.1 F (36.7 C), temperature source Oral, resp. rate 19, height 5' (1.524 m), weight 56.7 kg (125 lb), SpO2 97.00%.  General appearance :Awake, pleasantly confused-but follow some commands.Not in any distress. Speech Clear. Not toxic Looking HEENT: Atraumatic and Normocephalic, pupils equally reactive to light and accomodation Neck: supple, no JVD. No cervical lymphadenopathy.  Chest:Good air entry bilaterally, no added sounds  CVS: S1 S2 regular, 2/6 systolic murmur. Abdomen: Bowel sounds present, Non tender and not distended with no gaurding, rigidity or rebound. Extremities: B/L Lower Ext shows no edema, both legs are warm to touch. Left leg appears slightly shortened. Neurology: Limited exam-but seems to move all 4 extremities. Skin:No Rash Wounds:N/A  LABS ON ADMISSION:   Recent Labs  12/20/13 1615  NA 139  K 4.0  CL 99  CO2 23  GLUCOSE 108*  BUN 42*  CREATININE 1.24*  CALCIUM 9.4    Recent Labs  12/20/13 1615  AST 17  ALT 8  ALKPHOS 77  BILITOT 0.6  PROT 7.6  ALBUMIN 3.7   No results found for this basename: LIPASE, AMYLASE,  in the last 72 hours  Recent Labs   12/20/13 1615  WBC 8.1  NEUTROABS 6.6  HGB 11.9*  HCT 36.6  MCV 82.8  PLT 148*    Recent Labs  12/20/13 1615  TROPONINI <0.30   No results found for this basename: DDIMER,  in the last 72 hours No components found with this basename: POCBNP,    RADIOLOGIC STUDIES ON ADMISSION: Dg Chest 1 View  12/20/2013   CLINICAL DATA:  fall  EXAM: CHEST - 1 VIEW  COMPARISON:  Portal chest radiograph 06/02/2013  FINDINGS: Low lung volumes. Cardiac silhouette is stable. The aorta is tortuous and ectatic containing atherosclerotic calcifications. A smoothly marginated area of density is appreciated within the right lung base. This appears to continue outside the border of the chest and is likely secondary to overlying breast tissue. The lungs otherwise clear. Mild degenerative changes of the shoulders. No acute osseous abnormalities. No evidence of a pneumothorax.  IMPRESSION: No acute cardiopulmonary disease.   Electronically Signed   By: Margaree Mackintosh M.D.  On: 12/20/2013 17:24   Dg Hip Complete Left  12/20/2013   CLINICAL DATA:  Pain post trauma  EXAM: LEFT HIP - COMPLETE 2+ VIEW  COMPARISON:  Radiograph and CT left hip December 23, 2010  FINDINGS: Frontal pelvis as well as frontal and lateral views of the left hip were obtained. There is marked protrusio acetabula on the left. There is extensive remodeling of the left hip femoral head with avascular necrosis in this area. There old healed fractures of the left ischium and superior pubic ramus. There is a subtle lucency in the left femoral neck seen on the frontal view only, raising concern for nondisplaced fracture in this area. Osteoporosis in this area makes evaluation somewhat difficult. There is no dislocation. There is moderate narrowing of the right hip joint, stable. There are multiple foci of arterial vascular calcification.  IMPRESSION: Subtle lucency in the left femoral neck concerning for nondisplaced fracture. There is osteoporosis in this area  making evaluation difficult. Correlation with CT of the left hip may well be advisable given this appearance.  There remains extensive protrusio acetabula on the left with avascular necrosis in the left femoral head and advanced osteoarthritis and remodeling in this area. There old healed fractures of the left superior pubic ramus and ischium. There is moderate osteoarthritic change in the right hip joint.   Electronically Signed   By: Lowella Grip M.D.   On: 12/20/2013 17:25   Ct Head Wo Contrast  12/20/2013   CLINICAL DATA:  Fall, weakness.  EXAM: CT HEAD WITHOUT CONTRAST  TECHNIQUE: Contiguous axial images were obtained from the base of the skull through the vertex without intravenous contrast.  COMPARISON:  06/02/2013  FINDINGS: There is atrophy and chronic small vessel disease changes. No acute intracranial abnormality. Specifically, no hemorrhage, hydrocephalus, mass lesion, acute infarction, or significant intracranial injury. No acute calvarial abnormality. Visualized paranasal sinuses and mastoids clear. Orbital soft tissues unremarkable.  IMPRESSION: No acute intracranial abnormality.  Atrophy, chronic microvascular disease.   Electronically Signed   By: Rolm Baptise M.D.   On: 12/20/2013 16:57   Ct Hip Left Wo Contrast  12/20/2013   CLINICAL DATA:  Fall. Left hip pain. Abnormal left hip radiograph. Equivocal for fracture.  EXAM: CT OF THE LEFT HIP WITHOUT CONTRAST  TECHNIQUE: Multidetector CT imaging was performed according to the standard protocol. Multiplanar CT image reconstructions were also generated.  COMPARISON:  Left hip radiographs on 12/20/2013  FINDINGS: No evidence acute fracture or dislocation involving the left hip.  Severe left protrusio acetabula demonstrated, with severe left hip osteoarthritis and chronic avascular necrosis of the left femoral head causing chronic collapse of the articular surface. Old fracture deformities are also seen involving the left superior and inferior  pubic rami. Generalized osteopenia noted as well as pubic symphysis degenerative changes.  IMPRESSION: No evidence of acute left hip fracture or dislocation.  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  Osteopenia and old fracture deformities of the left pubic rami.   Electronically Signed   By: Earle Gell M.D.   On: 12/20/2013 18:48     EKG: Independently reviewed. Atrial fibrillation  ASSESSMENT AND PLAN: Present on Admission:  . UTI (lower urinary tract infection) - Admit to regular medical surgical unit, start IV Rocephin, cultures have already been obtained at PCPs office-please follow and narrow antibiotics accordingly. Currently afebrile and nontoxic looking.   Marland Kitchen HIP PAIN, LEFT/low back pain - Patient has chronic pain from  arthritis, patient is a very poor historian and it is difficult to discern how acute this pain is. She unfortunately does not seem to localize the pain on repeated questioning. Her left leg, seems to be slightly shorter than the right. CT of the hip is negative for acute fracture. We will obtain a MRI of the pelvis for further clarification. - In the meantime, control pain with scheduled Tylenol, as needed tramadol for moderate pain, and as needed morphine for severe pain. - PT eval will be obtained.  . Atrial fibrillation - EKG done on the field-consistent with A. fib. Rate is controlled without the use of any medications. She clearly is not a candidate for anticoagulation, will continue aspirin. I am not sure whether this is a new or old issue. Suspect getting an echocardiogram would not change management.  . CKD (chronic kidney disease), stage II - Creatinine close to usual baseline. Monitor electrolytes periodically.   . Chronic pain - As above. Chronic pain secondary to arthritis.   . Anemia of chronic disease - Continue to monitor hemoglobin periodically.   . Hypertension - Currently  uncontrolled, suspect this is from pain. Continue with benazepril, if another agent is required, will need to add beta blockers given atrial fibrillation.   Marland Kitchen LIVER MASS - Has known history of liver hemangioma-day for further continued surveillance in the outpatient setting.  Further plan will depend as patient's clinical course evolves and further radiologic and laboratory data become available. Patient will be monitored closely.   Above noted plan was discussed with grand-nephew (primary caregiver), he was in agreement.   DVT Prophylaxis: Prophylactic Heparin  Code Status: Full Code  Total time spent for admission equals 45 minutes.  [facility] Pager [phone]  If 7PM-7AM, please contact night-coverage [email or web address] Password [email or web address] 12/20/2013, 8:59 PM  **Disclaimer: This note may have been dictated with voice recognition software. Similar sounding words can inadvertently be transcribed and this note may contain transcription errors which may not have been corrected upon publication of note.**

## 2013-12-20 NOTE — Assessment & Plan Note (Signed)
Patient with progressive weakness over the past 4 days. She status post fall concern about possible hip fracture or spinal fracture. Her appetite is also decreased therefore she's been worked up for infection I did obtain a urine catheter specimen which is been sent off. Her heart rate is also very regularly EKGs showing a lot of PACs but it sounds like more atrophic relation with her weakness I think this needs to be monitored to assure if this is a comfortable he sure not. I discussed this with her nephew-Ivan

## 2013-12-20 NOTE — Patient Instructions (Signed)
To [facility] ER

## 2013-12-20 NOTE — ED Provider Notes (Signed)
CSN: [id number]     Arrival date & time 12/20/13  1520 History  This chart was scribed for Janice Norrie, MD by Roxan Diesel, ED scribe.  This patient was seen in room APA10/APA10 and the patient's care was started at 3:49 PM.   Chief Complaint  Patient presents with  . Failure To Thrive    The history is provided by the patient and a relative. No language interpreter was used.    Level 5 Caveat: Confusion  HPI Comments: HIROMI KNODEL is a 80 y.o. female brought in by EMS to the Emergency Department complaining of failure to thrive.  Nephew states that 3 days ago he heard pt fall in the other room.  When he went to help her he found her on the floor in a doorway.  She was conscious and he denies head impact to his knowledge.  Since then she has been lying in bed and has resisted getting out of bed or walking.  She is typically able to ambulate by herself with a walker.  At her baseline, nephew does have to bathe her each day and help her get dressed but she then walks to the kitchen to eat each morning and is able to help him care for her.  He states she seems much more weak than usual.  In addition she complains of pain whenever he tries to move her.  He has had trouble moving her to the bathroom or feeding her for this reason.  He states she seems generally sore but he feels she may have most severe pain in her hip.  He took her to her PCP earlier today and PCP called EMS to bring her here for further evaluation.  Nephew also notes that 3 days ago he noticed her "spit up" a small amount two times.  He denies any other vomiting.  He states her breathing seems the same as usual.  Pt denies CP, SOB.  Pt does not smoke or drink.  Nephew has been taking care of her for about 2 years.  Pt states "You must be talking about some other person". She will not admit to having any pain or problem.Marland Kitchen   PCP Dr Buelah Manis  Past Medical History  Diagnosis Date  . GERD (gastroesophageal reflux disease)   .  Complete rupture of rotator cuff   . Pain in joint, shoulder region   . Chronic kidney disease, stage I   . Iron deficiency anemia, unspecified   . Bereavement, uncomplicated   . Edema   . Hyperglycemia   . Unspecified constipation   . Osteoporosis, unspecified   . OA (osteoarthritis)   . Unspecified essential hypertension   . Other and unspecified hyperlipidemia   . Diverticulosis of colon (without mention of hemorrhage)   . Herpes simplex keratitis 2011    Past Surgical History  Procedure Laterality Date  . Abdominal hysterectomy    . Benign cyst removal from lateral chest    . Eye surgery      cataract    Family History  Problem Relation Age of Onset  . Stroke Father   . Dementia Mother   . Dementia Sister   . Cancer Brother     throat   . Nephrolithiasis Brother     History  Substance Use Topics  . Smoking status: Former Research scientist (life sciences)  . Smokeless tobacco: Not on file  . Alcohol Use: No  lives at home Uses a walker Lives with nephew and his daughter  OB History   Grav Para Term Preterm Abortions TAB SAB Ect Mult Living                   Review of Systems  Unable to perform ROS: Other      Allergies  Review of patient's allergies indicates no known allergies.  Home Medications   Prior to Admission medications   Medication Sig Start Date End Date Taking? Authorizing Provider  acetaminophen (TYLENOL) 500 MG tablet Take 500-1,000 mg by mouth every 6 (six) hours as needed.   Yes Historical Provider, MD  aspirin EC 81 MG tablet Take 81 mg by mouth daily.   Yes Historical Provider, MD  benazepril (LOTENSIN) 20 MG tablet Take 1 tablet (20 mg total) by mouth daily. 08/09/13  Yes Alycia Rossetti, MD  pravastatin (PRAVACHOL) 20 MG tablet Take 1 tablet (20 mg total) by mouth at bedtime. 08/28/13  Yes Alycia Rossetti, MD  traMADol (ULTRAM) 50 MG tablet Take 50 mg by mouth every 6 (six) hours as needed for moderate pain or severe pain.   Yes Historical Provider, MD    BP 158/83  Temp(Src) 98.2 F (36.8 C) (Oral)  Resp 18  Ht 5' (1.524 m)  Wt 125 lb (56.7 kg)  BMI 24.41 kg/m2  SpO2 95%  Vital signs normal    Physical Exam  Nursing note and vitals reviewed. Constitutional: She appears well-developed and well-nourished.  Non-toxic appearance. She does not appear ill. No distress.  HENT:  Head: Normocephalic and atraumatic.  Right Ear: External ear normal.  Left Ear: External ear normal.  Nose: Nose normal. No mucosal edema or rhinorrhea.  Mouth/Throat: Oropharynx is clear and moist and mucous membranes are normal. No dental abscesses or uvula swelling.  Eyes: Conjunctivae and EOM are normal. Pupils are equal, round, and reactive to light.  Neck: Normal range of motion and full passive range of motion without pain. Neck supple.  Cardiovascular: Normal rate and normal heart sounds.  An irregular rhythm present. Exam reveals no gallop and no friction rub.   No murmur heard. PT noted to have PAC's on the monitor  Pulmonary/Chest: Effort normal and breath sounds normal. No respiratory distress. She has no wheezes. She has no rhonchi. She has no rales. She exhibits no tenderness and no crepitus.  Abdominal: Soft. Normal appearance and bowel sounds are normal. She exhibits no distension. There is no tenderness. There is no rebound and no guarding.  Musculoskeletal: She exhibits no edema.  Swan neck deformity of fingers. Left leg shortened, no internal or external rotation. Marked bunions bilaterally.  Neurological: She is alert. She has normal strength. No cranial nerve deficit.  Skin: Skin is warm, dry and intact. No rash noted. No erythema. No pallor.  Psychiatric: She has a normal mood and affect. Her speech is normal and behavior is normal. Her mood appears not anxious.    ED Course  Procedures (including critical care time)  Medications  0.9 %  sodium chloride infusion (not administered)  cefTRIAXone (ROCEPHIN) 1 g in dextrose 5 % 50 mL  IVPB (not administered)  sodium chloride 0.9 % bolus 500 mL (0 mLs Intravenous Stopped 12/20/13 1954)  morphine 4 MG/ML injection 4 mg (4 mg Intravenous Given 12/20/13 1737)     DIAGNOSTIC STUDIES: Oxygen Saturation is 95% on room air, adequate by my interpretation.    COORDINATION OF CARE: 4:00 PM-Discussed treatment plan which includes IV fluids, imaging, and labs with pt and nephew at bedside and they  agreed to plan.   After reviewing her x-ray CT scan was ordered. After reviewing her urinalysis which was hard to obtain patient was given Rocephin 1 g IV. Her last urine culture was done on February 23 which grew out over 100,000 colonies of Escherichia coli that was sensitive to all antibiotics tested. Family has not been back in the room the couple times I have been back to discuss her test results. Patient has basically been non-ambulatory the last 3 days. She's been weak and staying in bed. She was started on IV antibiotics for her UTI. She will need admission. She may need MRI to make sure she does not have an occult fracture of her hip.  20:26 Dr Sloan Leiter, admit to med-surg, team 1  Results for orders placed during the hospital encounter of 12/20/13  URINALYSIS, ROUTINE W REFLEX MICROSCOPIC      Result Value Ref Range   Color, Urine YELLOW  YELLOW   APPearance HAZY (*) CLEAR   Specific Gravity, Urine 1.025  1.005 - 1.030   pH 5.5  5.0 - 8.0   Glucose, UA NEGATIVE  NEGATIVE mg/dL   Hgb urine dipstick MODERATE (*) NEGATIVE   Bilirubin Urine NEGATIVE  NEGATIVE   Ketones, ur TRACE (*) NEGATIVE mg/dL   Protein, ur 100 (*) NEGATIVE mg/dL   Urobilinogen, UA 0.2  0.0 - 1.0 mg/dL   Nitrite POSITIVE (*) NEGATIVE   Leukocytes, UA NEGATIVE  NEGATIVE  CBC WITH DIFFERENTIAL      Result Value Ref Range   WBC 8.1  4.0 - 10.5 K/uL   RBC 4.42  3.87 - 5.11 MIL/uL   Hemoglobin 11.9 (*) 12.0 - 15.0 g/dL   HCT 36.6  36.0 - 46.0 %   MCV 82.8  78.0 - 100.0 fL   MCH 26.9  26.0 - 34.0 pg   MCHC 32.5   30.0 - 36.0 g/dL   RDW 16.0 (*) 11.5 - 15.5 %   Platelets 148 (*) 150 - 400 K/uL   Neutrophils Relative % 81 (*) 43 - 77 %   Lymphocytes Relative 10 (*) 12 - 46 %   Monocytes Relative 9  3 - 12 %   Eosinophils Relative 0  0 - 5 %   Basophils Relative 0  0 - 1 %   Neutro Abs 6.6  1.7 - 7.7 K/uL   Lymphs Abs 0.8  0.7 - 4.0 K/uL   Monocytes Absolute 0.7  0.1 - 1.0 K/uL   Eosinophils Absolute 0.0  0.0 - 0.7 K/uL   Basophils Absolute 0.0  0.0 - 0.1 K/uL   Smear Review LARGE PLATELETS PRESENT    COMPREHENSIVE METABOLIC PANEL      Result Value Ref Range   Sodium 139  137 - 147 mEq/L   Potassium 4.0  3.7 - 5.3 mEq/L   Chloride 99  96 - 112 mEq/L   CO2 23  19 - 32 mEq/L   Glucose, Bld 108 (*) 70 - 99 mg/dL   BUN 42 (*) 6 - 23 mg/dL   Creatinine, Ser 1.24 (*) 0.50 - 1.10 mg/dL   Calcium 9.4  8.4 - 10.5 mg/dL   Total Protein 7.6  6.0 - 8.3 g/dL   Albumin 3.7  3.5 - 5.2 g/dL   AST 17  0 - 37 U/L   ALT 8  0 - 35 U/L   Alkaline Phosphatase 77  39 - 117 U/L   Total Bilirubin 0.6  0.3 - 1.2 mg/dL   GFR  calc non Af Amer 37 (*) >90 mL/min   GFR calc Af Amer 43 (*) >90 mL/min   Anion gap 17 (*) 5 - 15  TROPONIN I      Result Value Ref Range   Troponin I <0.30  <0.30 ng/mL  PRO B NATRIURETIC PEPTIDE      Result Value Ref Range   Pro B Natriuretic peptide (BNP) 1143.0 (*) 0 - 450 pg/mL  URINE MICROSCOPIC-ADD ON      Result Value Ref Range   Squamous Epithelial / LPF MANY (*) RARE   WBC, UA 11-20  <3 WBC/hpf   RBC / HPF 0-2  <3 RBC/hpf   Bacteria, UA MANY (*) RARE   Urine-Other AMORPHOUS URATES/PHOSPHATES       Dg Chest 1 View  12/20/2013   CLINICAL DATA:  fall  EXAM: CHEST - 1 VIEW  COMPARISON:  Portal chest radiograph 06/02/2013  FINDINGS: Low lung volumes. Cardiac silhouette is stable. The aorta is tortuous and ectatic containing atherosclerotic calcifications. A smoothly marginated area of density is appreciated within the right lung base. This appears to continue outside the border of  the chest and is likely secondary to overlying breast tissue. The lungs otherwise clear. Mild degenerative changes of the shoulders. No acute osseous abnormalities. No evidence of a pneumothorax.  IMPRESSION: No acute cardiopulmonary disease.   Electronically Signed   By: Margaree Mackintosh M.D.   On: 12/20/2013 17:24     Dg Hip Complete Left  12/20/2013   CLINICAL DATA:  Pain post trauma  EXAM: LEFT HIP - COMPLETE 2+ VIEW  COMPARISON:  Radiograph and CT left hip December 23, 2010  FINDINGS: Frontal pelvis as well as frontal and lateral views of the left hip were obtained. There is marked protrusio acetabula on the left. There is extensive remodeling of the left hip femoral head with avascular necrosis in this area. There old healed fractures of the left ischium and superior pubic ramus. There is a subtle lucency in the left femoral neck seen on the frontal view only, raising concern for nondisplaced fracture in this area. Osteoporosis in this area makes evaluation somewhat difficult. There is no dislocation. There is moderate narrowing of the right hip joint, stable. There are multiple foci of arterial vascular calcification.  IMPRESSION: Subtle lucency in the left femoral neck concerning for nondisplaced fracture. There is osteoporosis in this area making evaluation difficult. Correlation with CT of the left hip may well be advisable given this appearance.  There remains extensive protrusio acetabula on the left with avascular necrosis in the left femoral head and advanced osteoarthritis and remodeling in this area. There old healed fractures of the left superior pubic ramus and ischium. There is moderate osteoarthritic change in the right hip joint.   Electronically Signed   By: Lowella Grip M.D.   On: 12/20/2013 17:25     Ct Head Wo Contrast  12/20/2013   CLINICAL DATA:  Fall, weakness.  EXAM: CT HEAD WITHOUT CONTRAST  TECHNIQUE: Contiguous axial images were obtained from the base of the skull through the  vertex without intravenous contrast.  COMPARISON:  06/02/2013  FINDINGS: There is atrophy and chronic small vessel disease changes. No acute intracranial abnormality. Specifically, no hemorrhage, hydrocephalus, mass lesion, acute infarction, or significant intracranial injury. No acute calvarial abnormality. Visualized paranasal sinuses and mastoids clear. Orbital soft tissues unremarkable.  IMPRESSION: No acute intracranial abnormality.  Atrophy, chronic microvascular disease.   Electronically Signed   By: Rolm Baptise M.D.  On: 12/20/2013 16:57     Ct Hip Left Wo Contrast  12/20/2013   CLINICAL DATA:  Fall. Left hip pain. Abnormal left hip radiograph. Equivocal for fracture.  EXAM: CT OF THE LEFT HIP WITHOUT CONTRAST  TECHNIQUE: Multidetector CT imaging was performed according to the standard protocol. Multiplanar CT image reconstructions were also generated.  COMPARISON:  Left hip radiographs on 12/20/2013  FINDINGS: No evidence acute fracture or dislocation involving the left hip.  Severe left protrusio acetabula demonstrated, with severe left hip osteoarthritis and chronic avascular necrosis of the left femoral head causing chronic collapse of the articular surface. Old fracture deformities are also seen involving the left superior and inferior pubic rami. Generalized osteopenia noted as well as pubic symphysis degenerative changes.  IMPRESSION: No evidence of acute left hip fracture or dislocation.  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  Osteopenia and old fracture deformities of the left pubic rami.   Electronically Signed   By: Earle Gell M.D.   On: 12/20/2013 18:48       Date: 12/20/2013  Rate: 86  Rhythm: normal sinus rhythm and premature atrial contractions (PAC)  QRS Axis: left  Intervals: normal  ST/T Wave abnormalities: nonspecific ST/T changes  Conduction Disutrbances:LVH  Narrative Interpretation:   Old EKG  Reviewed: none available   MDM   Final diagnoses:  Acute cystitis without hematuria  Weakness  Hip pain, left    Plan admission   Rolland Porter, MD, FACEP   I personally performed the services described in this documentation, which was scribed in my presence. The recorded information has been reviewed and considered.  Rolland Porter, MD, Abram Sander    Janice Norrie, MD 12/20/13 2032

## 2013-12-21 ENCOUNTER — Inpatient Hospital Stay (HOSPITAL_COMMUNITY): Payer: Medicare Other

## 2013-12-21 ENCOUNTER — Encounter (HOSPITAL_COMMUNITY): Payer: Self-pay

## 2013-12-21 DIAGNOSIS — E440 Moderate protein-calorie malnutrition: Secondary | ICD-10-CM | POA: Insufficient documentation

## 2013-12-21 DIAGNOSIS — N3000 Acute cystitis without hematuria: Secondary | ICD-10-CM

## 2013-12-21 LAB — CBC
HCT: 36.8 % (ref 36.0–46.0)
Hemoglobin: 11.5 g/dL — ABNORMAL LOW (ref 12.0–15.0)
MCH: 26 pg (ref 26.0–34.0)
MCHC: 31.3 g/dL (ref 30.0–36.0)
MCV: 83.3 fL (ref 78.0–100.0)
Platelets: 134 10*3/uL — ABNORMAL LOW (ref 150–400)
RBC: 4.42 MIL/uL (ref 3.87–5.11)
RDW: 16 % — AB (ref 11.5–15.5)
WBC: 6.6 10*3/uL (ref 4.0–10.5)

## 2013-12-21 LAB — BASIC METABOLIC PANEL
Anion gap: 13 (ref 5–15)
BUN: 36 mg/dL — ABNORMAL HIGH (ref 6–23)
CALCIUM: 8.9 mg/dL (ref 8.4–10.5)
CHLORIDE: 105 meq/L (ref 96–112)
CO2: 23 mEq/L (ref 19–32)
CREATININE: 0.97 mg/dL (ref 0.50–1.10)
GFR calc Af Amer: 57 mL/min — ABNORMAL LOW (ref >90)
GFR calc non Af Amer: 50 mL/min — ABNORMAL LOW (ref >90)
GLUCOSE: 100 mg/dL — AB (ref 70–99)
Potassium: 3.9 mEq/L (ref 3.7–5.3)
Sodium: 141 mEq/L (ref 137–147)

## 2013-12-21 MED ORDER — BENAZEPRIL HCL 10 MG PO TABS
20.0000 mg | ORAL_TABLET | Freq: Every day | ORAL | Status: DC
  Administered 2013-12-21 – 2013-12-23 (×3): 20 mg via ORAL
  Filled 2013-12-21 (×4): qty 2

## 2013-12-21 MED ORDER — ASPIRIN EC 81 MG PO TBEC
81.0000 mg | DELAYED_RELEASE_TABLET | Freq: Every day | ORAL | Status: DC
  Administered 2013-12-21 – 2013-12-23 (×3): 81 mg via ORAL
  Filled 2013-12-21 (×3): qty 1

## 2013-12-21 MED ORDER — ONDANSETRON HCL 4 MG/2ML IJ SOLN: 4.0000 mg | Freq: Four times a day (QID) | INTRAMUSCULAR | Status: DC | PRN

## 2013-12-21 MED ORDER — ONDANSETRON HCL 4 MG PO TABS: 4.0000 mg | ORAL_TABLET | Freq: Four times a day (QID) | ORAL | Status: DC | PRN

## 2013-12-21 MED ORDER — SENNA 8.6 MG PO TABS
1.0000 | ORAL_TABLET | Freq: Two times a day (BID) | ORAL | Status: DC
  Administered 2013-12-21 – 2013-12-23 (×5): 8.6 mg via ORAL
  Filled 2013-12-21 (×5): qty 1

## 2013-12-21 MED ORDER — SODIUM CHLORIDE 0.9 % IV SOLN
INTRAVENOUS | Status: AC
  Administered 2013-12-21: via INTRAVENOUS

## 2013-12-21 MED ORDER — LABETALOL HCL 5 MG/ML IV SOLN
10.0000 mg | INTRAVENOUS | Status: DC | PRN
  Administered 2013-12-22: 10 mg via INTRAVENOUS
  Filled 2013-12-21 (×2): qty 4

## 2013-12-21 MED ORDER — HEPARIN SODIUM (PORCINE) 5000 UNIT/ML IJ SOLN
5000.0000 [IU] | Freq: Three times a day (TID) | INTRAMUSCULAR | Status: DC
  Administered 2013-12-21 – 2013-12-23 (×6): 5000 [IU] via SUBCUTANEOUS
  Filled 2013-12-21 (×6): qty 1

## 2013-12-21 MED ORDER — MORPHINE SULFATE 2 MG/ML IJ SOLN: 1.0000 mg | INTRAMUSCULAR | Status: DC | PRN

## 2013-12-21 MED ORDER — HYDRALAZINE HCL 20 MG/ML IJ SOLN: 10.0000 mg | Freq: Four times a day (QID) | INTRAMUSCULAR | Status: DC | PRN

## 2013-12-21 MED ORDER — ACETAMINOPHEN 650 MG RE SUPP
650.0000 mg | Freq: Four times a day (QID) | RECTAL | Status: DC | PRN
Start: 2013-12-21 — End: 2013-12-21

## 2013-12-21 MED ORDER — GUAIFENESIN-DM 100-10 MG/5ML PO SYRP: 5.0000 mL | ORAL_SOLUTION | ORAL | Status: DC | PRN

## 2013-12-21 MED ORDER — ACETAMINOPHEN 500 MG PO TABS
1000.0000 mg | ORAL_TABLET | Freq: Three times a day (TID) | ORAL | Status: DC
  Administered 2013-12-21 – 2013-12-23 (×6): 1000 mg via ORAL
  Filled 2013-12-21 (×6): qty 2

## 2013-12-21 MED ORDER — SIMVASTATIN 10 MG PO TABS
10.0000 mg | ORAL_TABLET | Freq: Every day | ORAL | Status: DC
  Administered 2013-12-21 – 2013-12-22 (×2): 10 mg via ORAL
  Filled 2013-12-21 (×2): qty 1

## 2013-12-21 MED ORDER — ALBUTEROL SULFATE (2.5 MG/3ML) 0.083% IN NEBU: 2.5000 mg | INHALATION_SOLUTION | RESPIRATORY_TRACT | Status: DC | PRN

## 2013-12-21 MED ORDER — TRAMADOL HCL 50 MG PO TABS: 50.0000 mg | ORAL_TABLET | Freq: Four times a day (QID) | ORAL | Status: DC | PRN

## 2013-12-21 MED ORDER — DEXTROSE 5 % IV SOLN
1.0000 g | Freq: Every day | INTRAVENOUS | Status: DC
  Administered 2013-12-21 – 2013-12-22 (×2): 1 g via INTRAVENOUS
  Filled 2013-12-21 (×4): qty 10

## 2013-12-21 MED ORDER — ENSURE COMPLETE PO LIQD
237.0000 mL | Freq: Two times a day (BID) | ORAL | Status: DC
  Administered 2013-12-21 – 2013-12-23 (×3): 237 mL via ORAL

## 2013-12-21 MED ORDER — ACETAMINOPHEN 325 MG PO TABS: 650.0000 mg | ORAL_TABLET | Freq: Four times a day (QID) | ORAL | Status: DC | PRN

## 2013-12-21 MED ORDER — FLEET ENEMA 7-19 GM/118ML RE ENEM: 1.0000 | ENEMA | Freq: Once | RECTAL | Status: AC | PRN

## 2013-12-21 NOTE — Evaluation (Signed)
Physical Therapy Evaluation Patient Details Name: [name] [name] MRN: [id number] DOB: [birth year] Today's Date: 12/21/2013   History of Present Illness  SHANECIA [name] is a 80 y.o. female with a Past Medical History of hypertension, rheumatoid arthritis, cavernous hemangioma of the liver who presents today with the above noted complaint. This elderly female this with her grandnephew, she is mostly independent in her daily activities requiring some minimal assistance from her family, 2 days back she apparently sustained a mechanical fall. Following which, patient has been nonambulatory. She is now totally dependent on family for activities of daily living. Patient has been complaining of worsening of her chronic pain. Patient is a very poor historian, does not localize the pain on repeated questioning. However her family-grand nephew-claims that even lifting her from the bed provokes the pain, even he cannot localize the pain to a particular worsening of the body. Because of persistent pain, failure to thrive like symptoms, patient went to see her primary care practitioner today, who promptly referred the patient to the ED for further evaluation. In the ED, a x-ray of the left hip was  equivocal for a nondisplaced fracture, this was followed by a CT scan of the left hip, which was negative for acute fracture or dislocation. Further evaluation revealed a UTI. I was subsequently asked to admit this patient for further evaluation and treatment.  Patient has chronic pain syndrome from severe rheumatoid arthritis, and the only thing she complains today during my evaluation is of low back pain.  Clinical Impression  Pt is a 80 year old female who presents to physical therapy with dx of UTI.  Pt had a fall at home, with increasing assistance with functional mobility and complaints of pain.  During evaluation, pt unable to pinpoint location of pain though did state some pain in low back (B) sides.  With  palpation, noted grimacing with palpation to Lt hip, though no grimacing with palpation to low back musculature.  Pt lives with her grand nephew in a single story home with a ramped entrance and (B) handrails.  Family reports pt is mod (I) with bed mobility skills, transfers, and household amb with use of RW; pt does use (B) canes for car transfers and an electric scooter when in stores.  During evaluation, pt required min/mod assist for bed mobility skills and mod assist for transfers.  Noted decreased WB on the Lt LE in standing, though pt unable to state if this was secondary to pain.  Amb not attempted at this time secondary to decreased WB on Lt LE and pending MRI of pelvis to assess for additional acute injury.  No restrictions at this time for WB precautions.  Recommend continued PT to address strengthening, balance, activity tolerance for improvement of functional mobility skills with transition to SNF at discharge.  Pt may benefit from use of W/C for community mobility skills vs. (B) canes, though will defer for SNF for DME supplies.      Follow Up Recommendations SNF    Equipment Recommendations  Wheelchair (measurements PT) (Recommend W/C, though will defer to SNF for DME as appropriate)       Precautions / Restrictions Precautions Precautions: Fall Restrictions Weight Bearing Restrictions: No Other Position/Activity Restrictions: No WB precautions at this time.  Awaiting MRI of pelvis to confirm no acute abnormalities      Mobility  Bed Mobility Overal bed mobility: Needs Assistance Bed Mobility: Supine to Sit;Sit to Supine     Supine to sit: Mod  assist (for trunk) Sit to supine: Min assist (for LE )      Transfers Overall transfer level: Needs assistance Equipment used: Rolling walker (2 wheeled) Transfers: Sit to/from Stand Sit to Stand: Mod assist         General transfer comment: Noted decreased WB on Lt side with standing, though pt unable to report if pain is  the limiting factor.   Ambulation/Gait             General Gait Details: Not attempted at this time secondary to difficulty with sit <-> stand transfers.  Also pt to have MRI today of pelvis; will attempt amb when cleared/ pt able to WB     Balance Overall balance assessment: Needs assistance Sitting-balance support: Feet supported;Bilateral upper extremity supported Sitting balance-Leahy Scale: Good     Standing balance support: Bilateral upper extremity supported;During functional activity (Required mod assist to stand with (B) UE on RW) Standing balance-Leahy Scale: Poor                               Pertinent Vitals/Pain PAINAD 1 with sitting EOB    Home Living Family/patient expects to be discharged to:: Unsure Living Arrangements: Other relatives (Lives with grand nephew)               Additional Comments: Pt lives in a single story home with grand nephew with a ramped entrance and (B) handrails.  DME: RW, 2 canes, BSC, raised toilet, shower seat, walk in shower    Prior Function Level of Independence: Independent with assistive device(s);Needs assistance   Gait / Transfers Assistance Needed: Villanueva nephew reports pt is mod (I) with bed mobility skills, transfers, and household amb with use of RW.  Pt does use two canes to ascend/descend ramp or enter stores.  For community amb uses Transport planner from stores.   ADL's / Homemaking Assistance Needed: Pt requires assistance with bathing, and some assist with dressing.            Extremity/Trunk Assessment               Lower Extremity Assessment: Generalized weakness;LLE deficits/detail   LLE Deficits / Details: Noted discomfort with palpation of Lt hip, though no discomfort reported with palpation to low back.  Pt does verbalize pain to low back (B) sides and not to Lt hip.      Communication   Communication: No difficulties  Cognition Arousal/Alertness: Awake/alert Behavior During  Therapy: WFL for tasks assessed/performed Overall Cognitive Status: History of cognitive impairments - at baseline                        Assessment/Plan    PT Assessment Patient needs continued PT services  PT Diagnosis Difficulty walking;Generalized weakness;Acute pain   PT Problem List Decreased strength;Decreased cognition;Decreased activity tolerance;Decreased safety awareness;Decreased balance;Decreased mobility;Pain  PT Treatment Interventions Balance training;Gait training;Neuromuscular re-education;Functional mobility training;Therapeutic activities;Therapeutic exercise   PT Goals (Current goals can be found in the Care Plan section) Acute Rehab PT Goals Patient Stated Goal: decrease assistance with transfers PT Goal Formulation: With family Time For Goal Achievement: 01/04/14 Potential to Achieve Goals: Good    Frequency Min 3X/week   Barriers to discharge Decreased caregiver support Pt requires increased assistance with functional mobility at this time, and requires skilled services to progress functionally.         End of Session Equipment Utilized During  Treatment: Gait belt Activity Tolerance: Patient limited by pain;Patient limited by fatigue (Pt unable to verbalize exact location of rating for pain, though noted decreased WB on Lt with pain reported with palpation to Lt side/hip) Patient left: in bed;with call bell/phone within reach;with family/visitor present (NSG aide present at end of eval for bathing; alarms will be reapplied after bathing)           Time: 1020-1045 PT Time Calculation (min): 25 min   Charges:   PT Evaluation $Initial PT Evaluation Tier I: 1 Procedure          Saketh Daubert 12/21/2013, 11:00 AM

## 2013-12-21 NOTE — Progress Notes (Signed)
SLP Cancellation Note  Patient Details Name: [name] [name] MRN: [id number] DOB: [birth year]   Cancelled treatment:       Reason Eval/Treat Not Completed: Other (comment); Acknowledge consult for BSE, SLP unable to complete today. Will f/u tomorrow.    Taison Celani 12/21/2013, 4:46 PM

## 2013-12-21 NOTE — Care Management Utilization Note (Signed)
UR completed 

## 2013-12-21 NOTE — Progress Notes (Signed)
Note: This document was prepared with digital dictation and possible smart phrase technology. Any transcriptional errors that result from this process are unintentional.   [name] [name] PPI:[id number] DOB: 19-Mar-1921 DOA: 12/20/2013 PCP: Vic Blackbird, MD  Brief narrative:  80 y/o ?, known h/o chronic left hip pain and wheelchair-bound [dating back at least to 12/22/00/12/2010 admission-known acetabular volume loss]-chronic and followed in the past by Dr. Aline Brochure, chronic anemia followed in the past day oncology/gastroenterology Neijstrom/Rourk, known history sent to be hemangioma of liver 11/1998 and, colonoscopy = 2000 the left-sided diverticulosis, sinus bradycardia, rheumatoid arthritis, hypertension reflux, prior bilateral rotator cuff disease admitted to the hospital 12/20/13 with intractable hip pain and Adult FTT  Past medical history-As per Problem list Chart reviewed as below-  reviewed  Consultants:  Orthopedics  Procedures:  HIP xray  CT hip   MRI hip  Antibiotics:    Subjective  Alert but calls her nephew her uncle Know she is in hospital SW discussing with patient and she states " i dont want to go to a nursing home"   Objective    Interim History:   Telemetry:    Objective: Filed Vitals:   12/20/13 2300 12/21/13 0009 12/21/13 0647 12/21/13 0742  BP: 163/68 168/81 177/75   Pulse:    60  Temp:  98.2 F (36.8 C) 98.2 F (36.8 C)   TempSrc:  Oral Oral   Resp: 24 22  19   Height:  5' (1.524 m)    Weight:  61.5 kg (135 lb 9.3 oz)    SpO2: 96% 96% 98% 97%    Intake/Output Summary (Last 24 hours) at 12/21/13 1422 Last data filed at 12/21/13 0647  Gross per 24 hour  Intake      0 ml  Output      1 ml  Net     -1 ml    Exam:  General: alert pleasant a little confused Cardiovascular:  s1 s2 no m/r/g Respiratory: clear no added sound Abdomen: soft nt/nd Skin no le edema, SLR _ for pain in hip Neuro nad  Data Reviewed: Basic Metabolic  Panel:  Recent Labs Lab 12/20/13 1615 12/21/13 0626  NA 139 141  K 4.0 3.9  CL 99 105  CO2 23 23  GLUCOSE 108* 100*  BUN 42* 36*  CREATININE 1.24* 0.97  CALCIUM 9.4 8.9   Liver Function Tests:  Recent Labs Lab 12/20/13 1615  AST 17  ALT 8  ALKPHOS 77  BILITOT 0.6  PROT 7.6  ALBUMIN 3.7   No results found for this basename: LIPASE, AMYLASE,  in the last 168 hours No results found for this basename: AMMONIA,  in the last 168 hours CBC:  Recent Labs Lab 12/20/13 1615 12/21/13 0626  WBC 8.1 6.6  NEUTROABS 6.6  --   HGB 11.9* 11.5*  HCT 36.6 36.8  MCV 82.8 83.3  PLT 148* 134*   Cardiac Enzymes:  Recent Labs Lab 12/20/13 1615  TROPONINI <0.30   BNP: No components found with this basename: POCBNP,  CBG: No results found for this basename: GLUCAP,  in the last 168 hours  No results found for this or any previous visit (from the past 240 hour(s)).   Studies:              All Imaging reviewed and is as per above notation   Scheduled Meds: . acetaminophen  1,000 mg Oral 3 times per day  . aspirin EC  81 mg Oral Daily  .  benazepril  20 mg Oral Daily  . cefTRIAXone (ROCEPHIN)  IV  1 g Intravenous QHS  . feeding supplement (ENSURE COMPLETE)  237 mL Oral BID BM  . heparin  5,000 Units Subcutaneous 3 times per day  . senna  1 tablet Oral BID  . simvastatin  10 mg Oral q1800   Continuous Infusions:    Assessment/Plan: 1. Hip pain with ? AVN-?candidate for hemiarthroplasty given limitaiton o ROM.  Consulted Ortho-continue tramadol 50 q 6 hrly 2. Pyelonephritis-continue Ceftriaxone.  Await UC/BC 3. AKI 2/2 to pre-renal ATN-Continue IVF c NS 50 cc/hr-rpt bmet in am 4. Rheum arthritis-not currently on DMARDS-suspect end stage and risks outweigh benefits 5. Htn-continue benazepril 20 daily 6. Hld-cont Zoxor 10 daily 7. Dysphagia-cont dys 3 diet 8. Stable hemagiaoma liver-OP follow up-LFt's as screening here  Code Status: full Family Communication:  discussed briefly c patient nephew Disposition Plan: full   Verneita Griffes, MD  [facility] Pager [phone] 12/21/2013, 2:22 PM    LOS: 1 day

## 2013-12-21 NOTE — Progress Notes (Signed)
INITIAL NUTRITION ASSESSMENT  DOCUMENTATION CODES Per approved criteria  -Non-severe (moderate) malnutrition in the context of chronic illness   Pt meets criteria for moderate MALNUTRITION in the context of chronic illness as evidenced by <75% estimated energy intake x 1 month, moderate fat and muscle loss.  INTERVENTION: Ensure Complete po BID, each supplement provides 350 kcal and 13 grams of protein  NUTRITION DIAGNOSIS: Inadequate oral intake related to decreased appetite as evidenced by PO: 50%, moderate fat and muscle loss.   Goal: Pt will meet >90% of estimated nutritional needs  Monitor:  PO/supplement intake, labs, weight changes, I/O's   Reason for Assessment: MST=3  80 y.o. female  Admitting Dx: <principal problem not specified>  ASSESSMENT: Pt admitted with FTT and low back pain. S/p fall at home. H&P reveals x-ray of the left hip was equivocal for a nondisplaced fracture, this was followed by a CT scan of the left hip, which was negative for acute fracture or dislocation. Further evaluation revealed a UTI. Pt reports fair appetite presently and PTA. H&P reveals minimal intake PTA. Breakfast tray in room reveals 50% of breakfast eaten. She denies any trouble chewing or swallowing. She also denies weight loss or weight gain. Suspect inaccuracies in hx, as pt would respond "I don't know" to many questions asked.  She reports she drinks Ensure at home and is agreeable to order to help maximize nutritional status. Discussed importance of good PO and supplement intake to support healing process while in hospital.  Noted elevated BUN: 36 and glucose: 100, both of which have improved during hospitalization.   Nutrition Focused Physical Exam:  Subcutaneous Fat:  Orbital Region: WDL Upper Arm Region: moderate depletion Thoracic and Lumbar Region: moderate depletion  Muscle:  Temple Region: moderate depletion Clavicle Bone Region: smoderate depletion Clavicle and Acromion  Bone Region: moderate depletion Scapular Bone Region: moderate depletion Dorsal Hand: moderate depletion Patellar Region: moderate depletion Anterior Thigh Region: moderate depletion Posterior Calf Region: moderate depletion  Edema: none present   Height: Ht Readings from Last 1 Encounters:  12/21/13 5' (1.524 m)    Weight: Wt Readings from Last 1 Encounters:  12/21/13 135 lb 9.3 oz (61.5 kg)    Ideal Body Weight: 100%  % Ideal Body Weight: 135%  Wt Readings from Last 10 Encounters:  12/21/13 135 lb 9.3 oz (61.5 kg)  08/07/13 116 lb (52.617 kg)  06/02/13 119 lb (53.978 kg)  05/16/13 119 lb (53.978 kg)  02/09/13 119 lb (53.978 kg)  11/10/12 119 lb (53.978 kg)  09/12/12 121 lb (54.885 kg)  08/10/12 119 lb (53.978 kg)  02/03/12 119 lb (53.978 kg)  12/03/11 119 lb 0.6 oz (53.996 kg)    Usual Body Weight: 119#  % Usual Body Weight: 113%  BMI:  Body mass index is 26.48 kg/(m^2). Overweight  Estimated Nutritional Needs: Kcal: 6440-3474 Protein: 68-80 grams Fluid: 1.5-1.8 L  Skin: Intact, no edema noted  Diet Order: Dysphagia 3  EDUCATION NEEDS: -Education needs addressed   Intake/Output Summary (Last 24 hours) at 12/21/13 1020 Last data filed at 12/21/13 0647  Gross per 24 hour  Intake      0 ml  Output      1 ml  Net     -1 ml    Last BM: 12/20/13  Labs:   Recent Labs Lab 12/20/13 1615 12/21/13 0626  NA 139 141  K 4.0 3.9  CL 99 105  CO2 23 23  BUN 42* 36*  CREATININE 1.24* 0.97  CALCIUM  9.4 8.9  GLUCOSE 108* 100*    CBG (last 3)  No results found for this basename: GLUCAP,  in the last 72 hours  Scheduled Meds: . acetaminophen  1,000 mg Oral 3 times per day  . aspirin EC  81 mg Oral Daily  . benazepril  20 mg Oral Daily  . cefTRIAXone (ROCEPHIN)  IV  1 g Intravenous QHS  . heparin  5,000 Units Subcutaneous 3 times per day  . senna  1 tablet Oral BID  . simvastatin  10 mg Oral q1800    Continuous Infusions:   Past Medical  History  Diagnosis Date  . GERD (gastroesophageal reflux disease)   . Complete rupture of rotator cuff   . Pain in joint, shoulder region   . Chronic kidney disease, stage I   . Iron deficiency anemia, unspecified   . Bereavement, uncomplicated   . Edema   . Hyperglycemia   . Unspecified constipation   . Osteoporosis, unspecified   . OA (osteoarthritis)   . Unspecified essential hypertension   . Other and unspecified hyperlipidemia   . Diverticulosis of colon (without mention of hemorrhage)   . Herpes simplex keratitis 2011    Past Surgical History  Procedure Laterality Date  . Abdominal hysterectomy    . Benign cyst removal from lateral chest    . Eye surgery      cataract    Nyema Hachey A. Jimmye Norman, RD, LDN Pager: [phone]

## 2013-12-21 NOTE — Progress Notes (Signed)
Stated she was hungry. Gave frozen dinner. Sat up in bed and fed herself. Denies pain.

## 2013-12-22 LAB — COMPREHENSIVE METABOLIC PANEL
ALBUMIN: 3.3 g/dL — AB (ref 3.5–5.2)
ALT: 8 U/L (ref 0–35)
ANION GAP: 12 (ref 5–15)
AST: 14 U/L (ref 0–37)
Alkaline Phosphatase: 70 U/L (ref 39–117)
BUN: 31 mg/dL — AB (ref 6–23)
CALCIUM: 8.8 mg/dL (ref 8.4–10.5)
CO2: 25 mEq/L (ref 19–32)
CREATININE: 0.93 mg/dL (ref 0.50–1.10)
Chloride: 102 mEq/L (ref 96–112)
GFR calc non Af Amer: 52 mL/min — ABNORMAL LOW (ref >90)
GFR, EST AFRICAN AMERICAN: 60 mL/min — AB (ref >90)
GLUCOSE: 102 mg/dL — AB (ref 70–99)
Potassium: 3.4 mEq/L — ABNORMAL LOW (ref 3.7–5.3)
Sodium: 139 mEq/L (ref 137–147)
TOTAL PROTEIN: 6.9 g/dL (ref 6.0–8.3)
Total Bilirubin: 0.3 mg/dL (ref 0.3–1.2)

## 2013-12-22 LAB — CBC WITH DIFFERENTIAL/PLATELET
Basophils Absolute: 0 10*3/uL (ref 0.0–0.1)
Basophils Relative: 0 % (ref 0–1)
EOS PCT: 2 % (ref 0–5)
Eosinophils Absolute: 0.1 10*3/uL (ref 0.0–0.7)
HCT: 35.4 % — ABNORMAL LOW (ref 36.0–46.0)
HEMOGLOBIN: 11.4 g/dL — AB (ref 12.0–15.0)
LYMPHS ABS: 1.1 10*3/uL (ref 0.7–4.0)
Lymphocytes Relative: 20 % (ref 12–46)
MCH: 26.7 pg (ref 26.0–34.0)
MCHC: 32.2 g/dL (ref 30.0–36.0)
MCV: 82.9 fL (ref 78.0–100.0)
MONOS PCT: 11 % (ref 3–12)
Monocytes Absolute: 0.6 10*3/uL (ref 0.1–1.0)
NEUTROS PCT: 67 % (ref 43–77)
Neutro Abs: 3.8 10*3/uL (ref 1.7–7.7)
PLATELETS: 135 10*3/uL — AB (ref 150–400)
RBC: 4.27 MIL/uL (ref 3.87–5.11)
RDW: 16 % — ABNORMAL HIGH (ref 11.5–15.5)
WBC: 5.6 10*3/uL (ref 4.0–10.5)

## 2013-12-22 MED ORDER — AMLODIPINE BESYLATE 5 MG PO TABS
10.0000 mg | ORAL_TABLET | Freq: Every day | ORAL | Status: DC
  Administered 2013-12-22 – 2013-12-23 (×2): 10 mg via ORAL
  Filled 2013-12-22 (×2): qty 2
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
## 2013-12-22 NOTE — Clinical Social Work Placement (Signed)
    Clinical Social Work Department CLINICAL SOCIAL WORK PLACEMENT NOTE 12/25/2013  Patient:  [name] [name], [name] [name]  Account Number:  [id number] Admit date:  12/20/2013  Clinical Social Worker:  Edwyna Shell, CLINICAL SOCIAL WORKER  Date/time:  12/22/2013 02:30 PM  Clinical Social Work is seeking post-discharge placement for this patient at the following level of care:   SKILLED NURSING   (*CSW will update this form in Epic as items are completed)   12/22/2013  Patient/family provided with [facility] Department of Clinical Social Work's list of facilities offering this level of care within the geographic area requested by the patient (or if unable, by the patient's family).  12/22/2013  Patient/family informed of their freedom to choose among providers that offer the needed level of care, that participate in Medicare, Medicaid or managed care program needed by the patient, have an available bed and are willing to accept the patient.  12/22/2013  Patient/family informed of [facility]' ownership interest in [facility], as well as of the fact that they are under no obligation to receive care at this facility.  PASARR submitted to EDS on 12/21/2013 PASARR number received on 12/21/2013  FL2 transmitted to all facilities in geographic area requested by pt/family on  12/22/2013 FL2 transmitted to all facilities within larger geographic area on   Patient informed that his/her managed care company has contracts with or will negotiate with  certain facilities, including the following:     Patient/family informed of bed offers received:  12/22/2013 Patient chooses bed at [facility] Physician recommends and patient chooses bed at    Patient to be transferred to [place] on  12/23/2013 Patient to be transferred to facility by [facility] Patient and family notified of transfer on 12/23/2013 Name of family member notified:  [name] [name], [name] [name] (sister and brother)  The following physician request were entered in Epic:   Additional Comments:  Edwyna Shell, LCSW Clinical Social Worker [phone])

## 2013-12-22 NOTE — Clinical Social Work Note (Signed)
Patient oriented to self only, bed offers given to family Ileana Roup, nephew, and sisters and brother).  Family agreeable to patient transfer to [facility], will go to sign paperwork this afternoon.  Facility notified.  Edwyna Shell, LCSW Clinical Social Worker [phone])

## 2013-12-22 NOTE — Consult Note (Signed)
  Consult  Left hip pain  The patient has long-standing avascular necrosis of the left hip presents after falling with increased hip pain; she is not a surgical candidate secondary to her age medical condition in nature of the surgery to repair her hip which would require acetabular cage plus or minus bone grafting leg lengthening and severe blood loss  I've spoken to a family member and to the medical physician regarding this patient.  It is felt my opinion that she would not survive the surgery
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
## 2013-12-22 NOTE — Evaluation (Addendum)
Clinical/Bedside Swallow Evaluation Patient Details  Name: [name] [name] MRN: [id number] Date of Birth: Oct 09, 1920  Today's Date: 12/22/2013 Time: 6213-0865 SLP Time Calculation (min): 17 min  Past Medical History:  Past Medical History  Diagnosis Date  . GERD (gastroesophageal reflux disease)   . Complete rupture of rotator cuff   . Pain in joint, shoulder region   . Chronic kidney disease, stage I   . Iron deficiency anemia, unspecified   . Bereavement, uncomplicated   . Edema   . Hyperglycemia   . Unspecified constipation   . Osteoporosis, unspecified   . OA (osteoarthritis)   . Unspecified essential hypertension   . Other and unspecified hyperlipidemia   . Diverticulosis of colon (without mention of hemorrhage)   . Herpes simplex keratitis 2011   Past Surgical History:  Past Surgical History  Procedure Laterality Date  . Abdominal hysterectomy    . Benign cyst removal from lateral chest    . Eye surgery      cataract   HPI:  [name] [name] is a 80 y.o. female with a Past Medical History of hypertension, rheumatoid arthritis, cavernous hemangioma of the liver. This elderly female this with her grandnephew, she is mostly independent in her daily activities requiring some minimal assistance from her family, 2 days back she apparently sustained a mechanical fall. Following which, patient has been nonambulatory. Patient has been complaining of worsening of her chronic pain. Patient is a very poor historian, does not localize the pain on repeated questioning. However her family-grand nephew-claims that even lifting her from the bed provokes the pain, even he cannot localize the pain to a particular worsening of the body. Because of persistent pain, failure to thrive like symptoms, patient went to see her primary care practitioner today, who promptly referred the patient to the ED for further evaluation. In the ED, a x-ray of the left hip was equivocal for a nondisplaced  fracture, this was followed by a CT scan of the left hip, which was negative for acute fracture or dislocation. Further evaluation revealed a UTI. There is no recent history of fever, vomiting, diarrhea. There is a history of abdominal pain. Patient had 2 episodes of "spitting" Sunday,but has been eating since then. No history of chest pain, shortness of breath. There is a noted hx of dysphagia with pt on dysphagia 3 diet; speech therapy bedside swallow eval ordered to evaluate swallow function and appropriate diet.   Assessment / Plan / Recommendation Clinical Impression  Pt demonstrated no overt s/s of aspiration during bedside swallow eval; pharyngeal swallow appears timely with adequate hyolaryngeal elevation. Noted some labial/ lingual weakness during oral mech exam. Observed pt consume dysphagia 3 consistencies with meal tray during which pt demonstrated impaired mastication and very prolonged oral phase with even small bites of food; this is likely d/t lack of dentition (reported that she has dentures at home) and current oral weakness. Noted some oral residue after initial swallow which pt appeared to sense and cleared with subsequent swallows and liquid wash. Given these findings pt is at low risk of aspiration; however, would recommend downgrading to a dysphagia 2 consistency diet to increase ease of mastication. Thin liquids are fine with meds whole in liquid. Full supervision during meals recommended to encourage PO intake and to ensure that pt is attending to her meal given decreased cognitive status. ST will continue to follow for diet tolerance and advancement.    Aspiration Risk  Mild    Diet Recommendation Dysphagia  2 (Fine chop);Thin liquid   Liquid Administration via: Cup;Straw Medication Administration: Whole meds with liquid Supervision: Patient able to self feed;Full supervision/cueing for compensatory strategies Compensations: Slow rate;Small sips/bites;Check for  pocketing Postural Changes and/or Swallow Maneuvers: Seated upright 90 degrees;Upright 30-60 min after meal    Other  Recommendations Oral Care Recommendations: Oral care BID   Follow Up Recommendations  Skilled Nursing facility    Frequency and Duration min 2x/week  2 weeks   Pertinent Vitals/Pain n/a    SLP Swallow Goals     Swallow Study Prior Functional Status       General HPI: [name] [name] is a 80 y.o. female with a Past Medical History of hypertension, rheumatoid arthritis, cavernous hemangioma of the liver. This elderly female this with her grandnephew, she is mostly independent in her daily activities requiring some minimal assistance from her family, 2 days back she apparently sustained a mechanical fall. Following which, patient has been nonambulatory. Patient has been complaining of worsening of her chronic pain. Patient is a very poor historian, does not localize the pain on repeated questioning. However her family-grand nephew-claims that even lifting her from the bed provokes the pain, even he cannot localize the pain to a particular worsening of the body. Because of persistent pain, failure to thrive like symptoms, patient went to see her primary care practitioner today, who promptly referred the patient to the ED for further evaluation. In the ED, a x-ray of the left hip was equivocal for a nondisplaced fracture, this was followed by a CT scan of the left hip, which was negative for acute fracture or dislocation. Further evaluation revealed a UTI. There is no recent history of fever, vomiting, diarrhea. There is a history of abdominal pain. Patient had 2 episodes of "spitting" Sunday,but has been eating since then. No history of chest pain, shortness of breath. There is a noted hx of dysphagia with pt on dysphagia 3 diet; speech therapy bedside swallow eval ordered to evaluate swallow function and appropriate diet. Type of Study: Bedside swallow evaluation Diet Prior  to this Study: Dysphagia 3 (soft);Thin liquids Temperature Spikes Noted: No Respiratory Status: Room air History of Recent Intubation: No Behavior/Cognition: Alert;Cooperative;Requires cueing Self-Feeding Abilities: Able to feed self Patient Positioning: Upright in bed Baseline Vocal Quality: Clear Volitional Cough: Strong Volitional Swallow: Unable to elicit    Oral/Motor/Sensory Function Overall Oral Motor/Sensory Function: Impaired Labial ROM: Within Functional Limits Labial Symmetry: Within Functional Limits Labial Strength: Reduced Labial Sensation: Within Functional Limits Lingual ROM: Within Functional Limits Lingual Symmetry: Within Functional Limits Lingual Strength: Reduced Lingual Sensation: Within Functional Limits   Ice Chips Ice chips: Not tested   Thin Liquid Thin Liquid: Within functional limits Presentation: Straw    Nectar Thick Nectar Thick Liquid: Not tested   Honey Thick Honey Thick Liquid: Not tested   Puree Puree: Within functional limits Presentation: Spoon   Solid   GO    Solid: Impaired Presentation: Self Fed Oral Phase Impairments: Reduced lingual movement/coordination;Impaired mastication Oral Phase Functional Implications: Oral residue       Ole[place]ak, Amy K, MA, CCC-SLP 12/22/2013,10:17 AM

## 2013-12-23 DIAGNOSIS — W19XXXA Unspecified fall, initial encounter: Secondary | ICD-10-CM

## 2013-12-23 LAB — CBC WITH DIFFERENTIAL/PLATELET
Basophils Absolute: 0 10*3/uL (ref 0.0–0.1)
Basophils Relative: 0 % (ref 0–1)
EOS ABS: 0.1 10*3/uL (ref 0.0–0.7)
Eosinophils Relative: 1 % (ref 0–5)
HCT: 36.1 % (ref 36.0–46.0)
HEMOGLOBIN: 11.4 g/dL — AB (ref 12.0–15.0)
Lymphocytes Relative: 16 % (ref 12–46)
Lymphs Abs: 1.1 10*3/uL (ref 0.7–4.0)
MCH: 26.1 pg (ref 26.0–34.0)
MCHC: 31.6 g/dL (ref 30.0–36.0)
MCV: 82.8 fL (ref 78.0–100.0)
MONOS PCT: 7 % (ref 3–12)
Monocytes Absolute: 0.5 10*3/uL (ref 0.1–1.0)
Neutro Abs: 5.2 10*3/uL (ref 1.7–7.7)
Neutrophils Relative %: 76 % (ref 43–77)
Platelets: 121 10*3/uL — ABNORMAL LOW (ref 150–400)
RBC: 4.36 MIL/uL (ref 3.87–5.11)
RDW: 16 % — ABNORMAL HIGH (ref 11.5–15.5)
WBC: 6.9 10*3/uL (ref 4.0–10.5)

## 2013-12-23 LAB — BASIC METABOLIC PANEL
Anion gap: 11 (ref 5–15)
BUN: 23 mg/dL (ref 6–23)
CALCIUM: 8.8 mg/dL (ref 8.4–10.5)
CO2: 24 mEq/L (ref 19–32)
CREATININE: 0.86 mg/dL (ref 0.50–1.10)
Chloride: 102 mEq/L (ref 96–112)
GFR calc Af Amer: 66 mL/min — ABNORMAL LOW (ref >90)
GFR, EST NON AFRICAN AMERICAN: 57 mL/min — AB (ref >90)
Glucose, Bld: 90 mg/dL (ref 70–99)
Potassium: 3.4 mEq/L — ABNORMAL LOW (ref 3.7–5.3)
Sodium: 137 mEq/L (ref 137–147)

## 2013-12-23 LAB — URINE CULTURE: Colony Count: >=100000

## 2013-12-23 MED ORDER — AMLODIPINE BESYLATE 10 MG PO TABS: 10.0000 mg | ORAL_TABLET | Freq: Every day | ORAL | Status: DC

## 2013-12-23 MED ORDER — ENSURE COMPLETE PO LIQD: 237.0000 mL | Freq: Two times a day (BID) | ORAL | Status: DC

## 2013-12-23 MED ORDER — SENNA 8.6 MG PO TABS: 1.0000 | ORAL_TABLET | Freq: Two times a day (BID) | ORAL | Status: DC
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
## 2013-12-23 NOTE — Progress Notes (Signed)
Pt discharged to [facility] via EMS.  Pt nephew Paulo Fruit notified that patient would be discharged there today.  Pt IV removed without complications.

## 2014-01-01 ENCOUNTER — Encounter: Payer: Self-pay | Admitting: Family Medicine

## 2014-01-01 ENCOUNTER — Ambulatory Visit (INDEPENDENT_AMBULATORY_CARE_PROVIDER_SITE_OTHER): Payer: Medicare Other | Admitting: Family Medicine

## 2014-01-01 VITALS — BP 138/76 | HR 64 | Temp 98.9°F | Resp 14

## 2014-01-01 DIAGNOSIS — E440 Moderate protein-calorie malnutrition: Secondary | ICD-10-CM

## 2014-01-01 DIAGNOSIS — M8700 Idiopathic aseptic necrosis of unspecified bone: Secondary | ICD-10-CM

## 2014-01-01 DIAGNOSIS — M069 Rheumatoid arthritis, unspecified: Secondary | ICD-10-CM

## 2014-01-01 DIAGNOSIS — I10 Essential (primary) hypertension: Secondary | ICD-10-CM

## 2014-01-01 DIAGNOSIS — N390 Urinary tract infection, site not specified: Secondary | ICD-10-CM

## 2014-01-01 DIAGNOSIS — R2681 Unsteadiness on feet: Secondary | ICD-10-CM

## 2014-01-01 DIAGNOSIS — R269 Unspecified abnormalities of gait and mobility: Secondary | ICD-10-CM

## 2014-01-01 MED ORDER — BENAZEPRIL HCL 20 MG PO TABS: 20.0000 mg | ORAL_TABLET | Freq: Every day | ORAL | Status: DC

## 2014-01-01 MED ORDER — NITROFURANTOIN MONOHYD MACRO 100 MG PO CAPS
100.0000 mg | ORAL_CAPSULE | Freq: Every day | ORAL | Status: DC
Start: 2014-01-01 — End: 2014-02-16

## 2014-01-01 MED ORDER — PRAVASTATIN SODIUM 20 MG PO TABS: 20.0000 mg | ORAL_TABLET | Freq: Every day | ORAL | Status: DC

## 2014-01-01 NOTE — Assessment & Plan Note (Signed)
Wheelchair ordered.  
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
## 2014-01-01 NOTE — Patient Instructions (Addendum)
Continue current medications [facility] to provide services Wheelchair to be ordered  Macrobid once a day for urine infections F/U 4 months

## 2014-01-01 NOTE — Assessment & Plan Note (Signed)
Norvasc not needed Continue benezapril

## 2014-01-01 NOTE — Assessment & Plan Note (Signed)
Start macrobid 100mg  once a day for prophylaxis, benefits outweight risk

## 2014-01-03 ENCOUNTER — Telehealth: Payer: Self-pay | Admitting: *Deleted

## 2014-01-03 NOTE — Telephone Encounter (Signed)
noted 

## 2014-01-03 NOTE — Telephone Encounter (Signed)
Message copied by Sheral Flow on Wed Jan 03, 2014  9:30 AM ------      Message from: Lenore Manner      Created: Wed Jan 03, 2014  8:38 AM      Regarding: [place]: [phone]       Fraser Din from [facility] is calling about this pt            Needing a verbal order OT 2 times per week for 3 weeks       To increase independent adl's and improve home safety  ------

## 2014-01-03 NOTE — Telephone Encounter (Signed)
Call returned to [place], OT with [facility].   Requested order to extend OT services.   Verbal order given.

## 2014-01-04 ENCOUNTER — Telehealth: Payer: Self-pay | Admitting: *Deleted

## 2014-01-04 NOTE — Telephone Encounter (Signed)
Received call from family friend that works at [facility].   Reports that patient family member, Venora Maples is going to court on 01/16/2013 with DSS to try to remove patient nephew, Leanna Sato as caregiver.   Requested letter from MD about patient state of mind and competency.   States that she will call back on 01/05/2014 to speak with MD.

## 2014-01-05 NOTE — Telephone Encounter (Signed)
I will await this person to call me back, IVAN has POA and nothing has been revoked DSS has not tried to contact me, I will not act until I have some evidence and I will not supply any documentation as "Venora Maples" is not on the patients chart

## 2014-01-15 ENCOUNTER — Encounter: Payer: Self-pay | Admitting: Emergency Medicine

## 2014-01-23 ENCOUNTER — Telehealth: Payer: Self-pay | Admitting: Family Medicine

## 2014-01-23 NOTE — Telephone Encounter (Signed)
I discussed with the social worker Cari. There is some concerns based on a family friend who has some financial states for [name] [name] that I then may be misuing the money. I have not seen any concerns with her care there she does not come in very often. Her weight has been stable there's been no sign of abuse or neglect have really seen no deterioration with the exception of her last visit when she was admitted for her typical state. I do not think that she is competent enough to make decisions over her health care or her estate and I did let social services notice. Right now my plan is possibly to get a guardian ad litem I will have my nurse go ahead and work on an [place]  form to possibly get her back into [facility]

## 2014-01-23 NOTE — Telephone Encounter (Signed)
cari from social services in [place] calling to speak with you regarding patient. She wants to know if there is any concern about the care given to her by her nephew [name]  Please call her back at [phone] ext [phone]

## 2014-01-26 ENCOUNTER — Telehealth: Payer: Self-pay | Admitting: Family Medicine

## 2014-01-26 NOTE — Telephone Encounter (Signed)
cari from [place] social services call you letting you know that not enough evidence to put her back in nursing home but  They will do the guardianship. She would like to know if you can write a letter that states that [name] [name] does not have the capacity to take care of self and make her own decisions  [phone]  ext [phone] Fax [phone] to fax letter when written

## 2014-01-29 ENCOUNTER — Encounter: Payer: Self-pay | Admitting: Family Medicine

## 2014-01-29 NOTE — Telephone Encounter (Signed)
Okay to fax letter

## 2014-02-06 ENCOUNTER — Telehealth: Payer: Self-pay | Admitting: *Deleted

## 2014-02-06 NOTE — Telephone Encounter (Signed)
Received call from [place], [place] with [facility] [phone].  Reports that patient was seen today for PT services. States that BP noted elevated, and HR noted decreased. BP 202/78, HR 49.  States that patient has not had BP medications x2-3 days. Prescriptions are to be delivered on 02/06/2014.   Reports that patient is in no acute distress at this time.   Advised that patient family does have automatic cuff to re-check BP with at home and it is accurate.   Advised PT that when medications are available, patient should take morning med doses and repeat BP in 1 hour.   If BP remains >140/90, contact office.   Advised to monitor BP during day on 02/07/2014 and if BP remains >140/90 to contact office.   PT will return to home on 02/08/2014 to re-check.    MD to be made aware.

## 2014-02-08 NOTE — Telephone Encounter (Signed)
Needs to be seen to address BP

## 2014-02-08 NOTE — Telephone Encounter (Signed)
Received return call from [place], [place] with [facility].   Reports that patient has obtained all medications and is taking them as ordered.   Patient BP noted at 186/78, HR 64 on 02/08/2014.  MD please advise.

## 2014-02-09 NOTE — Telephone Encounter (Signed)
Called and spoke to [place] from [place] and informed her that pt needs to be seen to address the BP, Ebony Hail wil contact pt family and will have them to call and schedule appointment.

## 2014-02-16 ENCOUNTER — Encounter: Payer: Self-pay | Admitting: Family Medicine

## 2014-02-16 ENCOUNTER — Ambulatory Visit (INDEPENDENT_AMBULATORY_CARE_PROVIDER_SITE_OTHER): Payer: Medicare Other | Admitting: Family Medicine

## 2014-02-16 VITALS — BP 142/68 | HR 60 | Temp 98.1°F | Resp 14

## 2014-02-16 DIAGNOSIS — I10 Essential (primary) hypertension: Secondary | ICD-10-CM

## 2014-02-16 MED ORDER — AMLODIPINE BESYLATE 5 MG PO TABS: 5.0000 mg | ORAL_TABLET | Freq: Every day | ORAL | Status: DC

## 2014-02-16 NOTE — Assessment & Plan Note (Signed)
I will go ahead and add Norvasc 5 mg to her benazepril. Note I also talked with Ms. Shiffer by herself she states that she is being cared for properly by 5 and in that she is receiving her meals and bathing she does not receive any cash for anything but that the bills are patent is always good at home

## 2014-02-16 NOTE — Progress Notes (Signed)
Patient ID: [name] [name], female   DOB: 21-Nov-1920, 80 y.o.   MRN: [id number]   Subjective:    Patient ID: [name] [name], female    DOB: 1920-06-29, 80 y.o.   MRN: [id number]  Patient presents for BP Check   patient here for interim visit for blood pressure. She was having home health nurse as well as physical therapy working with her he noted that her blood pressure was elevating from 140s up to 169I diastolics were fairly normal. She denies any chest pain shortness of breath her nephew who is her caregiver I then states she's been eating well she's not had any falls no difficulty regarding the urinary tract infections.  He is aware of all the proceedings regarding her finances and that social services is involved able to work about 2 weeks it appears that she will probably have a guardian over her finances until all the investigations can be complete he does have a copy today of his dual power of attorney    Review Of Systems:  GEN- denies fatigue, fever, weight loss,weakness, recent illness HEENT- denies eye drainage, change in vision, nasal discharge, CVS- denies chest pain, palpitations RESP- denies SOB, cough, wheeze ABD- denies N/V, change in stools, abd pain GU- denies dysuria, hematuria, dribbling, incontinence MSK- + joint pain, muscle aches, injury Neuro- denies headache, dizziness, syncope, seizure activity       Objective:    BP 142/68  Pulse 60  Temp(Src) 98.1 F (36.7 C) (Oral)  Resp 14 GEN- NAD, alert and oriented x3  Repeat BP 162/58 HEENT- PERRL, EOMI, non injected sclera, pink conjunctiva, MMM, oropharynx clear Neck- Supple,  CVS- RRR,  no murmur RESP-CTAB EXT- No edema Pulses- Radial 2+     Assessment & Plan:      Problem List Items Addressed This Visit   None      Note: This dictation was prepared with Dragon dictation along with smaller phrase technology. Any transcriptional errors that result from this process are unintentional.

## 2014-02-16 NOTE — Patient Instructions (Signed)
Take the benazepril and take the new pill norvasc 5mg  once a day  F/U 2 months

## 2014-02-20 ENCOUNTER — Telehealth: Payer: Self-pay | Admitting: Family Medicine

## 2014-02-20 NOTE — Telephone Encounter (Signed)
[name] [name] I believe she said with DDS states that she had sent over something on Aug 28th requesting letter head sates with the status of the pts mental compacity for her to take to  court next week and she is needing last 5 office visits.  [phone] ext [phone]

## 2014-02-20 NOTE — Telephone Encounter (Signed)
Re-faxed notes and letter.

## 2014-02-20 NOTE — Telephone Encounter (Signed)
I wrote a letter on 8/17 about this, you can refax, okay to print last 5 OV and send as well

## 2014-02-20 NOTE — Telephone Encounter (Signed)
MD please advise

## 2014-03-02 ENCOUNTER — Ambulatory Visit (INDEPENDENT_AMBULATORY_CARE_PROVIDER_SITE_OTHER): Payer: Medicare Other | Admitting: Family Medicine

## 2014-03-02 ENCOUNTER — Encounter: Payer: Self-pay | Admitting: Family Medicine

## 2014-03-02 VITALS — BP 128/64 | HR 82 | Temp 98.4°F | Resp 18

## 2014-03-02 DIAGNOSIS — B372 Candidiasis of skin and nail: Secondary | ICD-10-CM

## 2014-03-02 DIAGNOSIS — L84 Corns and callosities: Secondary | ICD-10-CM

## 2014-03-02 DIAGNOSIS — Z23 Encounter for immunization: Secondary | ICD-10-CM

## 2014-03-02 DIAGNOSIS — M8700 Idiopathic aseptic necrosis of unspecified bone: Secondary | ICD-10-CM

## 2014-03-02 MED ORDER — NYSTATIN 100000 UNIT/GM EX CREA: 1.0000 "application " | TOPICAL_CREAM | Freq: Two times a day (BID) | CUTANEOUS | Status: DC

## 2014-03-02 NOTE — Assessment & Plan Note (Signed)
She is very mild irritation this may be due to the depends that she wears. I've given her nystatin cream they can also alternate this with Desitin

## 2014-03-02 NOTE — Assessment & Plan Note (Signed)
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

## 2014-03-02 NOTE — Assessment & Plan Note (Addendum)
OTC- salicylate topical

## 2014-03-02 NOTE — Progress Notes (Signed)
Patient ID: [name] [name], female   DOB: 29-Jan-1921, 80 y.o.   MRN: [id number]   Subjective:    Patient ID: [name] [name], female    DOB: 30-Jan-1921, 80 y.o.   MRN: [id number]  Patient presents for Rash, L Leg Pain and L 4th toe Scab  Patient here with her nephew . She had is a home health aide who was bathing her and noticed a rash between her legs. She denies any itching or any rashes she is noted. She does wear depends. She's not had any recent falls. She does state that her left hip was hurting her the other day this is the same side has a known arthritis and avascular necrosis. She does have tramadol to use as needed today she's been walking at her baseline without any significant pain.  She has a corn on her fourth toe she wanted me to look at. She denies any pain from the lesion.  Note regarding the previous guardianship case this is been discontinued and her nephew is still her guardian it a the allegations against him have dropped these  Review Of Systems:  GEN- denies fatigue, fever, weight loss,weakness, recent illness HEENT- denies eye drainage, change in vision, nasal discharge, CVS- denies chest pain, palpitations RESP- denies SOB, cough, wheeze ABD- denies N/V, change in stools, abd pain GU- denies dysuria, hematuria, dribbling, incontinence MSK- denies joint pain, muscle aches, injury Neuro- denies headache, dizziness, syncope, seizure activity       Objective:    BP 128/64  Pulse 82  Temp(Src) 98.4 F (36.9 C) (Oral)  Resp 18 GEN- NAD, alert and oriented x3 HEENT- PERRL, EOMI, non injected sclera, pink conjunctiva, MMM, oropharynx clear  CVS- RRR no murmur RESP-CTAB ABD-NABS, soft, NT,ND MSK- Decreased ,  ROM Bilat Hips, no pain with IR/ER contractures of hands Skin- corn of left foot 4th digit, NT, no erythema, mild erythema of vulva, no lesions noted, hypopigmentation of left labia majora- chronic EXT- No edema Pulses- Radial 2+        Assessment & Plan:      Problem List Items Addressed This Visit   None    Visit Diagnoses   Need for prophylactic vaccination and inoculation against influenza    -  Primary       Note: This dictation was prepared with Dragon dictation along with smaller phrase technology. Any transcriptional errors that result from this process are unintentional.

## 2014-03-02 NOTE — Patient Instructions (Signed)
Continue pain medication Apply antifungal cream to diaper area twice a day, can also use desitin cream for irritation Use over the counter corn medicine for toe  Flu shot given  F/U 3 months

## 2014-06-01 ENCOUNTER — Ambulatory Visit: Payer: Medicare Other | Admitting: Family Medicine

## 2014-06-04 ENCOUNTER — Ambulatory Visit: Payer: Medicare Other | Admitting: Family Medicine

## 2014-06-19 ENCOUNTER — Ambulatory Visit (INDEPENDENT_AMBULATORY_CARE_PROVIDER_SITE_OTHER): Payer: Medicare Other | Admitting: Family Medicine

## 2014-06-19 ENCOUNTER — Other Ambulatory Visit: Payer: Self-pay | Admitting: *Deleted

## 2014-06-19 ENCOUNTER — Encounter: Payer: Self-pay | Admitting: Family Medicine

## 2014-06-19 VITALS — BP 128/74 | HR 62 | Temp 97.8°F | Resp 14

## 2014-06-19 DIAGNOSIS — N390 Urinary tract infection, site not specified: Secondary | ICD-10-CM

## 2014-06-19 DIAGNOSIS — E785 Hyperlipidemia, unspecified: Secondary | ICD-10-CM

## 2014-06-19 DIAGNOSIS — I10 Essential (primary) hypertension: Secondary | ICD-10-CM

## 2014-06-19 LAB — CBC WITH DIFFERENTIAL/PLATELET
Basophils Absolute: 0 10*3/uL (ref 0.0–0.1)
Basophils Relative: 0 % (ref 0–1)
Eosinophils Absolute: 0 10*3/uL (ref 0.0–0.7)
Eosinophils Relative: 1 % (ref 0–5)
HEMATOCRIT: 32.4 % — AB (ref 36.0–46.0)
Hemoglobin: 10.6 g/dL — ABNORMAL LOW (ref 12.0–15.0)
LYMPHS ABS: 0.9 10*3/uL (ref 0.7–4.0)
Lymphocytes Relative: 26 % (ref 12–46)
MCH: 26.8 pg (ref 26.0–34.0)
MCHC: 32.7 g/dL (ref 30.0–36.0)
MCV: 82 fL (ref 78.0–100.0)
Monocytes Absolute: 0.2 10*3/uL (ref 0.1–1.0)
Monocytes Relative: 5 % (ref 3–12)
NEUTROS PCT: 68 % (ref 43–77)
Neutro Abs: 2.3 10*3/uL (ref 1.7–7.7)
PLATELETS: 136 10*3/uL — AB (ref 150–400)
RBC: 3.95 MIL/uL (ref 3.87–5.11)
RDW: 15.8 % — AB (ref 11.5–15.5)
WBC: 3.4 10*3/uL — AB (ref 4.0–10.5)

## 2014-06-19 LAB — COMPREHENSIVE METABOLIC PANEL
ALK PHOS: 74 U/L (ref 39–117)
ALT: <8 U/L (ref 0–35)
AST: 11 U/L (ref 0–37)
Albumin: 3.9 g/dL (ref 3.5–5.2)
BILIRUBIN TOTAL: 0.4 mg/dL (ref 0.2–1.2)
BUN: 22 mg/dL (ref 6–23)
CO2: 25 mEq/L (ref 19–32)
CREATININE: 1.06 mg/dL (ref 0.50–1.10)
Calcium: 8.6 mg/dL (ref 8.4–10.5)
Chloride: 105 mEq/L (ref 96–112)
GLUCOSE: 82 mg/dL (ref 70–99)
Potassium: 4.1 mEq/L (ref 3.5–5.3)
Sodium: 138 mEq/L (ref 135–145)
Total Protein: 6.3 g/dL (ref 6.0–8.3)

## 2014-06-19 LAB — LIPID PANEL
CHOL/HDL RATIO: 3.7 ratio
CHOLESTEROL: 149 mg/dL (ref 0–200)
HDL: 40 mg/dL (ref >39)
LDL CALC: 90 mg/dL (ref 0–99)
TRIGLYCERIDES: 97 mg/dL (ref <150)
VLDL: 19 mg/dL (ref 0–40)

## 2014-06-19 MED ORDER — ENSURE COMPLETE PO LIQD: 1.0000 | Freq: Two times a day (BID) | ORAL | Status: DC

## 2014-06-19 MED ORDER — BENAZEPRIL HCL 20 MG PO TABS: 20.0000 mg | ORAL_TABLET | Freq: Every day | ORAL | Status: DC

## 2014-06-19 MED ORDER — PRAVASTATIN SODIUM 20 MG PO TABS
20.0000 mg | ORAL_TABLET | Freq: Every day | ORAL | Status: DC
Start: 2014-06-19 — End: 2015-06-25

## 2014-06-19 MED ORDER — NITROFURANTOIN MONOHYD MACRO 100 MG PO CAPS: 100.0000 mg | ORAL_CAPSULE | Freq: Every day | ORAL | Status: DC

## 2014-06-19 MED ORDER — AMLODIPINE BESYLATE 5 MG PO TABS: 5.0000 mg | ORAL_TABLET | Freq: Every day | ORAL | Status: DC

## 2014-06-19 MED ORDER — SENNA 8.6 MG PO TABS: 1.0000 | ORAL_TABLET | Freq: Two times a day (BID) | ORAL | Status: DC

## 2014-06-19 NOTE — Patient Instructions (Signed)
Continue current medications F/U  6months  

## 2014-06-19 NOTE — Progress Notes (Signed)
Patient ID: [name] [name], female   DOB: 05-14-1921, 81 y.o.   MRN: [id number]   Subjective:    Patient ID: [name] [name], female    DOB: April 01, 1921, 81 y.o.   MRN: [id number]  Patient presents for 3 month F/U  Patient here to follow-up chronic medical problems. There are no specific concerns. She is doing fairly well at home. She has a good appetite. She is doing well with the prophylactic antibiotics regarding her recurrent UTIs   Review Of Systems:  GEN- denies fatigue, fever, weight loss,weakness, recent illness HEENT- denies eye drainage, change in vision, nasal discharge, CVS- denies chest pain, palpitations RESP- denies SOB, cough, wheeze ABD- denies N/V, change in stools, abd pain GU- denies dysuria, hematuria, dribbling, +incontinence MSK- + joint pain, muscle aches, injury Neuro- denies headache, dizziness, syncope, seizure activity       Objective:    BP 128/74 mmHg  Pulse 62  Temp(Src) 97.8 F (36.6 C) (Oral)  Resp 14 GEN- NAD, alert and oriented x3, sitting in wheelchair HEENT- PERRL, EOMI, non injected sclera, pink conjunctiva, MMM, oropharynx clear CVS- RRR, no murmur RESP-CTAB ABD-NABS,soft,NT,ND EXT- trace pedal  edema Pulses- Radial 2+        Assessment & Plan:      Problem List Items Addressed This Visit      Unprioritized   Hypertension - Primary (Chronic)   Relevant Orders      Comprehensive metabolic panel      CBC with Differential   Hyperlipidemia   Relevant Orders      Lipid panel      Note: This dictation was prepared with Dragon dictation along with smaller phrase technology. Any transcriptional errors that result from this process are unintentional.

## 2014-06-19 NOTE — Telephone Encounter (Signed)
Patient seen in office and requested refill on medications.   Prescription sent to pharmacy.

## 2014-06-19 NOTE — Assessment & Plan Note (Signed)
Recheck lipids as well as liver function still on statin drugs

## 2014-06-19 NOTE — Assessment & Plan Note (Signed)
She is doing quite well on the low dose nitrofurantoin

## 2014-06-19 NOTE — Assessment & Plan Note (Signed)
Well controlled, no signs of hypotension

## 2014-12-21 ENCOUNTER — Ambulatory Visit: Payer: Medicare Other | Admitting: Family Medicine

## 2014-12-24 ENCOUNTER — Encounter: Payer: Self-pay | Admitting: Family Medicine

## 2015-03-01 ENCOUNTER — Encounter: Payer: Self-pay | Admitting: Family Medicine

## 2015-03-19 ENCOUNTER — Telehealth: Payer: Self-pay | Admitting: Family Medicine

## 2015-03-19 NOTE — Telephone Encounter (Signed)
Pt has appt, this has been brought up in the past and dismissed by courts so I need to speak to family and pt directly

## 2015-03-19 NOTE — Telephone Encounter (Signed)
Social worker called regarding this pt and states that her caretaker (she believes) is not properly taking care of her. He is not giving her medications like he should as she has been to check on her and the refills and pill counts do not match what she should be taking. She is trying to get him relieved from being her care taker. The pt is over due for a OV and she will call him and get him to set up an appt but she wanted to make you aware in case there was anything that needed to be addressed at [facility].

## 2015-04-10 ENCOUNTER — Encounter: Payer: Self-pay | Admitting: Family Medicine

## 2015-04-10 ENCOUNTER — Ambulatory Visit (INDEPENDENT_AMBULATORY_CARE_PROVIDER_SITE_OTHER): Payer: Medicare Other | Admitting: Family Medicine

## 2015-04-10 VITALS — BP 130/76 | HR 78 | Temp 98.1°F | Resp 14 | Ht 59.0 in | Wt 121.0 lb

## 2015-04-10 DIAGNOSIS — D638 Anemia in other chronic diseases classified elsewhere: Secondary | ICD-10-CM

## 2015-04-10 DIAGNOSIS — I10 Essential (primary) hypertension: Secondary | ICD-10-CM

## 2015-04-10 DIAGNOSIS — Z23 Encounter for immunization: Secondary | ICD-10-CM

## 2015-04-10 DIAGNOSIS — N390 Urinary tract infection, site not specified: Secondary | ICD-10-CM

## 2015-04-10 DIAGNOSIS — Z0000 Encounter for general adult medical examination without abnormal findings: Secondary | ICD-10-CM

## 2015-04-10 DIAGNOSIS — N182 Chronic kidney disease, stage 2 (mild): Secondary | ICD-10-CM

## 2015-04-10 LAB — COMPREHENSIVE METABOLIC PANEL
ALBUMIN: 3.9 g/dL (ref 3.6–5.1)
ALT: 6 U/L (ref 6–29)
AST: 14 U/L (ref 10–35)
Alkaline Phosphatase: 69 U/L (ref 33–130)
BILIRUBIN TOTAL: 0.4 mg/dL (ref 0.2–1.2)
BUN: 29 mg/dL — AB (ref 7–25)
CO2: 21 mmol/L (ref 20–31)
CREATININE: 1.02 mg/dL — AB (ref 0.60–0.88)
Calcium: 8.9 mg/dL (ref 8.6–10.4)
Chloride: 107 mmol/L (ref 98–110)
GLUCOSE: 90 mg/dL (ref 70–99)
Potassium: 3.7 mmol/L (ref 3.5–5.3)
SODIUM: 140 mmol/L (ref 135–146)
Total Protein: 6.5 g/dL (ref 6.1–8.1)

## 2015-04-10 NOTE — Progress Notes (Signed)
Subjective:   Patient presents for Medicare Annual/Subsequent preventive examination.  A she here for follow-up and annual wellness exam. Firstly I received another phone call again from social services (Charlies Constable) stating that her nephew who is her current caregiver Dorris Carnes is being investigated again by DSS. They're concerned that he is taking her finances. She was last seen back in January at that time we have recently completed another evaluation from [place] and everything was found to be in order. Unfortunately Ivan's Michaelle Birks person who is problems [place] to her appointments the past couple years and yellow and that is been in contact with the office. I have not heard from any other family members. Her siblings are all in their 18s. She denies any neglect states that she feels safe at home with him and states that she is happy. He states that she eats fairly well she eats throughout the day . We do not have a recent weight on her, the previous is from a year ago when she was hospilized. He is given her 4 pills daily which included her 2 blood pressure medications for cholesterol and the Macrodantin which is been preventing her urinary tract infections. She has not been hospitalized since last July, where she had a fall as well as urinary tract infection. She has not required any pain medicine  Leanna Sato tells me he has a Chief Executive Officer handeling the case and that other family members want her money and estate.  Review Past Medical/Family/Social: Per EMS   Risk Factors  Current exercise habits: none, wheelchair bound 90% of time Dietary issues discussed: none of concern, drinking ensure,    Cardiac risk factors: HTN,Hyperlipidemia  Depression Screen  (Note: if answer to either of the following is "Yes", a more complete depression screening is indicated)  Over the past two weeks, have you felt down, depressed or hopeless? No Over the past two weeks, have you felt little interest or pleasure in doing  things? No Have you lost interest or pleasure in daily life? No Do you often feel hopeless? No Do you cry easily over simple problems? No   Activities of Daily Living  In your present state of health, do you have any difficulty performing the following activities?:  Driving? yes  Managing money? yes  Feeding yourself? No  Getting from bed to chair? yes  Climbing a flight of stairs? yes  Preparing food and eating?: yes  Bathing or showering? yes Getting dressed: yes Getting to the toilet?depends Using the toilet:yes  Moving around from place to place: yes  In the past year have you fallen or had a near fall?:No  Are you sexually active? No  Do you have more than one partner? No   Hearing Difficulties: No  Do you often ask people to speak up or repeat themselves?yes  Do you experience ringing or noises in your ears? No Do you have difficulty understanding soft or whispered voices? yes  Do you feel that you have a problem with memory? yes Do you often misplace items? No  Do you feel safe at home? Yes  Cognitive Testing  Alert? Yes Normal Appearance?Yes  Oriented to person? Yes Place? Yes  Time? Yes  Recall of three objects? no   Displays appropriate judgment?Yes  Can read the correct time from a watch face?-did not attempt   List the Names of Other Physician/Practitioners you currently use:   None    Screening Tests / Date -Not needed Colonoscopy  Zostavax  Mammogram  Influenza Vaccine -Given TODAY Tetanus/tdap  ROS: GEN- denies fatigue, fever, weight loss,weakness, recent illness HEENT- denies eye drainage, change in vision, nasal discharge, CVS- denies chest pain, palpitations RESP- denies SOB, cough, wheeze ABD- denies N/V, change in stools, abd pain GU- denies dysuria, hematuria, dribbling+, incontinence MSK- denies joint pain, muscle aches, injury Neuro- denies headache, dizziness, syncope, seizure activity  PHYSICAL: GEN- NAD, alert  and oriented x3, sitting in wheelchair, smiling ,well groomed  HEENT- PERRL, EOMI, non injected sclera, pink conjunctiva, MMM, oropharynx clear Neck- Supple,  CVS- RRR, no murmur, few PVC RESP-CTAB ABD-NABS,soft,ND,ND Skin- no breakdown , no sacral decub MSK- Contracture of fingers EXT- trace pedal edema Pulses- Radial, DP- 2+     Assessment:    Annual wellness medicare exam   Plan:    During the course of the visit the patient was educated and counseled about appropriate screening and preventive services including:  Flu shot given Check labs, no meds with her today, it seems that there is a lot of family issues surrounding money from this elderly woman which is very sad. With regard to her health to his happy and looks well today I also did not note any other issues in her last visit back in January with concern about neglect.   A few of the meds were not filled on time- such as pravastatin, and the lotensin, others have been fairly in time  Diet review for nutrition referral? Yes ____ Not Indicated __x__  Patient Instructions (the written plan) was given to the patient.  Medicare Attestation  I have personally reviewed:  The patient's medical and social history  Their use of alcohol, tobacco or illicit drugs  Their current medications and supplements  The patient's functional ability including ADLs,fall risks, home safety risks, cognitive, and hearing and visual impairment  Diet and physical activities  Evidence for depression or mood disorders  The patient's weight, height, BMI,  have been recorded in the chart. I have made referrals, counseling, and provided education to the patient based on review of the above and I have provided the patient with a written personalized care plan for preventive services.

## 2015-04-10 NOTE — Assessment & Plan Note (Signed)
Controlled, no hypotensive symptoms,meds not present today, she has been on norvasc consistently

## 2015-04-10 NOTE — Patient Instructions (Signed)
Flu shot given We will call with  Lab results  Continue current medications  F/U 6 months

## 2015-04-10 NOTE — Assessment & Plan Note (Signed)
Continue Macrobid, benefits outweigh risks

## 2015-04-11 LAB — CBC WITH DIFFERENTIAL/PLATELET
Basophils Absolute: 0 10*3/uL (ref 0.0–0.1)
Basophils Relative: 1 % (ref 0–1)
Eosinophils Absolute: 0 10*3/uL (ref 0.0–0.7)
Eosinophils Relative: 1 % (ref 0–5)
HCT: 31.9 % — ABNORMAL LOW (ref 36.0–46.0)
HEMOGLOBIN: 10.3 g/dL — AB (ref 12.0–15.0)
LYMPHS ABS: 0.9 10*3/uL (ref 0.7–4.0)
LYMPHS PCT: 22 % (ref 12–46)
MCH: 26.3 pg (ref 26.0–34.0)
MCHC: 32.3 g/dL (ref 30.0–36.0)
MCV: 81.6 fL (ref 78.0–100.0)
MONO ABS: 0.3 10*3/uL (ref 0.1–1.0)
MONOS PCT: 8 % (ref 3–12)
MPV: 11.7 fL (ref 8.6–12.4)
NEUTROS ABS: 2.9 10*3/uL (ref 1.7–7.7)
Neutrophils Relative %: 68 % (ref 43–77)
PLATELETS: 141 10*3/uL — AB (ref 150–400)
RBC: 3.91 MIL/uL (ref 3.87–5.11)
RDW: 15.6 % — ABNORMAL HIGH (ref 11.5–15.5)
WBC: 4.2 10*3/uL (ref 4.0–10.5)

## 2015-04-12 ENCOUNTER — Encounter: Payer: Self-pay | Admitting: *Deleted

## 2015-04-12 ENCOUNTER — Telehealth: Payer: Self-pay | Admitting: *Deleted

## 2015-04-12 NOTE — Telephone Encounter (Signed)
Received call from [name] [name] with DSS and [employer].   States that she requires FL2 in the case that patient is removed from guardianship of [name] [name].   States that there is ongoing case with DSS and [employer]PS lawyer is ready to file motion to remove [name] [name] as caregiver, and once filed patient will need to be placed in state assisted home.   [name] [name]: [phone] cell  MD please advise.

## 2015-04-15 NOTE — Telephone Encounter (Signed)
Discussed with pt, there is a hearing on Monday to discuss whether or not I think can continue to be her guardian or if another one will be appointed there was also be a decision made about he will be in charge of her financial state. It is noted that the guardian et Litem attorney is concerned about [name] [name]  Being her caretaker because he is Psychiatrist. [place]ay there has been a petition to remove her from the home urgently. FL2 will be completed as this will be needed for medical documentation to go into a ALF

## 2015-04-15 NOTE — Telephone Encounter (Signed)
FL2 completed and placed on MD desk for review.

## 2015-04-16 NOTE — Telephone Encounter (Signed)
FL2 signed and awaiting response from DSS.

## 2015-04-22 ENCOUNTER — Telehealth: Payer: Self-pay | Admitting: *Deleted

## 2015-04-22 NOTE — Telephone Encounter (Signed)
Received call from Patriciaann Clan, [place] in regards to ongoing court case.   Requested copy of most recent chart notes (04/10/2015). MD advised that notes can be faxed to [employer].   Prescription sent to pharmacy.

## 2015-06-12 ENCOUNTER — Encounter (HOSPITAL_COMMUNITY): Payer: Self-pay | Admitting: Emergency Medicine

## 2015-06-12 ENCOUNTER — Emergency Department (HOSPITAL_COMMUNITY): Payer: Medicare Other

## 2015-06-12 ENCOUNTER — Inpatient Hospital Stay (HOSPITAL_COMMUNITY)
Admission: EM | Admit: 2015-06-12 | Discharge: 2015-06-25 | DRG: 871 | Disposition: A | Discharge status: Hospice | Payer: Medicare Other | Attending: Internal Medicine | Admitting: Internal Medicine

## 2015-06-12 DIAGNOSIS — E875 Hyperkalemia: Secondary | ICD-10-CM | POA: Diagnosis not present

## 2015-06-12 DIAGNOSIS — E861 Hypovolemia: Secondary | ICD-10-CM | POA: Diagnosis present

## 2015-06-12 DIAGNOSIS — N184 Chronic kidney disease, stage 4 (severe): Secondary | ICD-10-CM | POA: Diagnosis present

## 2015-06-12 DIAGNOSIS — Z7189 Other specified counseling: Secondary | ICD-10-CM | POA: Diagnosis not present

## 2015-06-12 DIAGNOSIS — K5730 Diverticulosis of large intestine without perforation or abscess without bleeding: Secondary | ICD-10-CM | POA: Diagnosis present

## 2015-06-12 DIAGNOSIS — A498 Other bacterial infections of unspecified site: Secondary | ICD-10-CM | POA: Diagnosis present

## 2015-06-12 DIAGNOSIS — Z66 Do not resuscitate: Secondary | ICD-10-CM | POA: Diagnosis not present

## 2015-06-12 DIAGNOSIS — R112 Nausea with vomiting, unspecified: Secondary | ICD-10-CM | POA: Diagnosis present

## 2015-06-12 DIAGNOSIS — K5900 Constipation, unspecified: Secondary | ICD-10-CM | POA: Diagnosis present

## 2015-06-12 DIAGNOSIS — E876 Hypokalemia: Secondary | ICD-10-CM | POA: Diagnosis not present

## 2015-06-12 DIAGNOSIS — R739 Hyperglycemia, unspecified: Secondary | ICD-10-CM | POA: Diagnosis present

## 2015-06-12 DIAGNOSIS — K529 Noninfective gastroenteritis and colitis, unspecified: Secondary | ICD-10-CM

## 2015-06-12 DIAGNOSIS — T730XXA Starvation, initial encounter: Secondary | ICD-10-CM | POA: Diagnosis present

## 2015-06-12 DIAGNOSIS — R197 Diarrhea, unspecified: Secondary | ICD-10-CM | POA: Diagnosis present

## 2015-06-12 DIAGNOSIS — M810 Age-related osteoporosis without current pathological fracture: Secondary | ICD-10-CM | POA: Diagnosis present

## 2015-06-12 DIAGNOSIS — I129 Hypertensive chronic kidney disease with stage 1 through stage 4 chronic kidney disease, or unspecified chronic kidney disease: Secondary | ICD-10-CM | POA: Diagnosis present

## 2015-06-12 DIAGNOSIS — E870 Hyperosmolality and hypernatremia: Secondary | ICD-10-CM | POA: Diagnosis present

## 2015-06-12 DIAGNOSIS — N12 Tubulo-interstitial nephritis, not specified as acute or chronic: Secondary | ICD-10-CM | POA: Diagnosis present

## 2015-06-12 DIAGNOSIS — F0390 Unspecified dementia without behavioral disturbance: Secondary | ICD-10-CM | POA: Diagnosis present

## 2015-06-12 DIAGNOSIS — K219 Gastro-esophageal reflux disease without esophagitis: Secondary | ICD-10-CM | POA: Diagnosis present

## 2015-06-12 DIAGNOSIS — T17910A Gastric contents in respiratory tract, part unspecified causing asphyxiation, initial encounter: Secondary | ICD-10-CM | POA: Insufficient documentation

## 2015-06-12 DIAGNOSIS — E785 Hyperlipidemia, unspecified: Secondary | ICD-10-CM | POA: Diagnosis present

## 2015-06-12 DIAGNOSIS — N19 Unspecified kidney failure: Secondary | ICD-10-CM | POA: Diagnosis not present

## 2015-06-12 DIAGNOSIS — J690 Pneumonitis due to inhalation of food and vomit: Secondary | ICD-10-CM | POA: Diagnosis not present

## 2015-06-12 DIAGNOSIS — A419 Sepsis, unspecified organism: Secondary | ICD-10-CM | POA: Insufficient documentation

## 2015-06-12 DIAGNOSIS — R1310 Dysphagia, unspecified: Secondary | ICD-10-CM | POA: Diagnosis present

## 2015-06-12 DIAGNOSIS — R32 Unspecified urinary incontinence: Secondary | ICD-10-CM | POA: Diagnosis present

## 2015-06-12 DIAGNOSIS — Z7982 Long term (current) use of aspirin: Secondary | ICD-10-CM | POA: Diagnosis not present

## 2015-06-12 DIAGNOSIS — D509 Iron deficiency anemia, unspecified: Secondary | ICD-10-CM | POA: Diagnosis present

## 2015-06-12 DIAGNOSIS — R4701 Aphasia: Secondary | ICD-10-CM | POA: Diagnosis present

## 2015-06-12 DIAGNOSIS — I4891 Unspecified atrial fibrillation: Secondary | ICD-10-CM | POA: Diagnosis present

## 2015-06-12 DIAGNOSIS — R4189 Other symptoms and signs involving cognitive functions and awareness: Secondary | ICD-10-CM | POA: Diagnosis present

## 2015-06-12 DIAGNOSIS — E872 Acidosis, unspecified: Secondary | ICD-10-CM

## 2015-06-12 DIAGNOSIS — G92 Toxic encephalopathy: Secondary | ICD-10-CM | POA: Diagnosis present

## 2015-06-12 DIAGNOSIS — N179 Acute kidney failure, unspecified: Secondary | ICD-10-CM | POA: Diagnosis present

## 2015-06-12 DIAGNOSIS — Z87891 Personal history of nicotine dependence: Secondary | ICD-10-CM | POA: Diagnosis not present

## 2015-06-12 DIAGNOSIS — M1990 Unspecified osteoarthritis, unspecified site: Secondary | ICD-10-CM | POA: Diagnosis present

## 2015-06-12 DIAGNOSIS — J90 Pleural effusion, not elsewhere classified: Secondary | ICD-10-CM | POA: Diagnosis present

## 2015-06-12 DIAGNOSIS — I10 Essential (primary) hypertension: Secondary | ICD-10-CM | POA: Diagnosis not present

## 2015-06-12 DIAGNOSIS — Z515 Encounter for palliative care: Secondary | ICD-10-CM | POA: Diagnosis not present

## 2015-06-12 DIAGNOSIS — I481 Persistent atrial fibrillation: Secondary | ICD-10-CM | POA: Diagnosis not present

## 2015-06-12 DIAGNOSIS — A4159 Other Gram-negative sepsis: Principal | ICD-10-CM | POA: Diagnosis present

## 2015-06-12 DIAGNOSIS — G9340 Encephalopathy, unspecified: Secondary | ICD-10-CM | POA: Diagnosis present

## 2015-06-12 DIAGNOSIS — Z4659 Encounter for fitting and adjustment of other gastrointestinal appliance and device: Secondary | ICD-10-CM

## 2015-06-12 DIAGNOSIS — Z87898 Personal history of other specified conditions: Secondary | ICD-10-CM | POA: Diagnosis not present

## 2015-06-12 DIAGNOSIS — R6520 Severe sepsis without septic shock: Secondary | ICD-10-CM | POA: Diagnosis present

## 2015-06-12 DIAGNOSIS — R1110 Vomiting, unspecified: Secondary | ICD-10-CM | POA: Diagnosis present

## 2015-06-12 DIAGNOSIS — Z6826 Body mass index (BMI) 26.0-26.9, adult: Secondary | ICD-10-CM | POA: Diagnosis not present

## 2015-06-12 DIAGNOSIS — B964 Proteus (mirabilis) (morganii) as the cause of diseases classified elsewhere: Secondary | ICD-10-CM | POA: Diagnosis present

## 2015-06-12 DIAGNOSIS — T17918A Gastric contents in respiratory tract, part unspecified causing other injury, initial encounter: Secondary | ICD-10-CM | POA: Insufficient documentation

## 2015-06-12 DIAGNOSIS — L8990 Pressure ulcer of unspecified site, unspecified stage: Secondary | ICD-10-CM | POA: Insufficient documentation

## 2015-06-12 DIAGNOSIS — N171 Acute kidney failure with acute cortical necrosis: Secondary | ICD-10-CM | POA: Diagnosis not present

## 2015-06-12 DIAGNOSIS — R159 Full incontinence of feces: Secondary | ICD-10-CM | POA: Diagnosis present

## 2015-06-12 DIAGNOSIS — I480 Paroxysmal atrial fibrillation: Secondary | ICD-10-CM | POA: Diagnosis not present

## 2015-06-12 DIAGNOSIS — E860 Dehydration: Secondary | ICD-10-CM | POA: Diagnosis present

## 2015-06-12 DIAGNOSIS — R627 Adult failure to thrive: Secondary | ICD-10-CM | POA: Diagnosis present

## 2015-06-12 LAB — URINE MICROSCOPIC-ADD ON

## 2015-06-12 LAB — URINALYSIS, ROUTINE W REFLEX MICROSCOPIC
Glucose, UA: NEGATIVE mg/dL
Ketones, ur: 15 mg/dL — AB
Nitrite: POSITIVE — AB
PH: 7 (ref 5.0–8.0)
Protein, ur: 100 mg/dL — AB
SPECIFIC GRAVITY, URINE: 1.014 (ref 1.005–1.030)

## 2015-06-12 LAB — BLOOD GAS, VENOUS

## 2015-06-12 LAB — CBC WITH DIFFERENTIAL/PLATELET
BASOS ABS: 0 10*3/uL (ref 0.0–0.1)
BASOS PCT: 0 %
Eosinophils Absolute: 0 10*3/uL (ref 0.0–0.7)
Eosinophils Relative: 0 %
HEMATOCRIT: 41.6 % (ref 36.0–46.0)
HEMOGLOBIN: 13.4 g/dL (ref 12.0–15.0)
Lymphocytes Relative: 6 %
Lymphs Abs: 0.6 10*3/uL — ABNORMAL LOW (ref 0.7–4.0)
MCH: 27.3 pg (ref 26.0–34.0)
MCHC: 32.2 g/dL (ref 30.0–36.0)
MCV: 84.9 fL (ref 78.0–100.0)
Monocytes Absolute: 1 10*3/uL (ref 0.1–1.0)
Monocytes Relative: 9 %
NEUTROS ABS: 9.1 10*3/uL — AB (ref 1.7–7.7)
NEUTROS PCT: 85 %
Platelets: 200 10*3/uL (ref 150–400)
RBC: 4.9 MIL/uL (ref 3.87–5.11)
RDW: 16.4 % — ABNORMAL HIGH (ref 11.5–15.5)
WBC: 10.7 10*3/uL — ABNORMAL HIGH (ref 4.0–10.5)

## 2015-06-12 LAB — I-STAT VENOUS BLOOD GAS, ED
ACID-BASE DEFICIT: 15 mmol/L — AB (ref 0.0–2.0)
Bicarbonate: 13.1 mEq/L — ABNORMAL LOW (ref 20.0–24.0)
O2 SAT: 68 %
TCO2: 14 mmol/L (ref 0–100)
pCO2, Ven: 37.2 mmHg — ABNORMAL LOW (ref 45.0–50.0)
pH, Ven: 7.155 — CL (ref 7.250–7.300)
pO2, Ven: 45 mmHg (ref 30.0–45.0)

## 2015-06-12 LAB — I-STAT CHEM 8, ED
BUN: 48 mg/dL — AB (ref 6–20)
CALCIUM ION: 1.06 mmol/L — AB (ref 1.13–1.30)
CHLORIDE: 109 mmol/L (ref 101–111)
CREATININE: 2.7 mg/dL — AB (ref 0.44–1.00)
Glucose, Bld: 140 mg/dL — ABNORMAL HIGH (ref 65–99)
HEMATOCRIT: 45 % (ref 36.0–46.0)
Hemoglobin: 15.3 g/dL — ABNORMAL HIGH (ref 12.0–15.0)
Potassium: 4.5 mmol/L (ref 3.5–5.1)
SODIUM: 140 mmol/L (ref 135–145)
TCO2: 15 mmol/L (ref 0–100)

## 2015-06-12 LAB — CREATININE, SERUM
CREATININE: 2.81 mg/dL — AB (ref 0.44–1.00)
GFR calc non Af Amer: 14 mL/min — ABNORMAL LOW (ref >60)
GFR, EST AFRICAN AMERICAN: 16 mL/min — AB (ref >60)

## 2015-06-12 LAB — CBC
HCT: 38.1 % (ref 36.0–46.0)
HEMOGLOBIN: 11.6 g/dL — AB (ref 12.0–15.0)
MCH: 26 pg (ref 26.0–34.0)
MCHC: 30.4 g/dL (ref 30.0–36.0)
MCV: 85.2 fL (ref 78.0–100.0)
PLATELETS: 171 10*3/uL (ref 150–400)
RBC: 4.47 MIL/uL (ref 3.87–5.11)
RDW: 16.5 % — ABNORMAL HIGH (ref 11.5–15.5)
WBC: 8.1 10*3/uL (ref 4.0–10.5)

## 2015-06-12 LAB — COMPREHENSIVE METABOLIC PANEL
ALT: 19 U/L (ref 14–54)
AST: 48 U/L — AB (ref 15–41)
Albumin: 3.7 g/dL (ref 3.5–5.0)
Alkaline Phosphatase: 84 U/L (ref 38–126)
Anion gap: 23 — ABNORMAL HIGH (ref 5–15)
BUN: 48 mg/dL — AB (ref 6–20)
CHLORIDE: 106 mmol/L (ref 101–111)
CO2: 13 mmol/L — AB (ref 22–32)
Calcium: 9.1 mg/dL (ref 8.9–10.3)
Creatinine, Ser: 3.1 mg/dL — ABNORMAL HIGH (ref 0.44–1.00)
GFR, EST AFRICAN AMERICAN: 14 mL/min — AB (ref >60)
GFR, EST NON AFRICAN AMERICAN: 12 mL/min — AB (ref >60)
Glucose, Bld: 145 mg/dL — ABNORMAL HIGH (ref 65–99)
POTASSIUM: 4.6 mmol/L (ref 3.5–5.1)
SODIUM: 142 mmol/L (ref 135–145)
Total Bilirubin: 0.9 mg/dL (ref 0.3–1.2)
Total Protein: 6.9 g/dL (ref 6.5–8.1)

## 2015-06-12 LAB — I-STAT CG4 LACTIC ACID, ED
LACTIC ACID, VENOUS: 10.42 mmol/L — AB (ref 0.5–2.0)
Lactic Acid, Venous: 6.1 mmol/L (ref 0.5–2.0)

## 2015-06-12 LAB — GLUCOSE, CAPILLARY: Glucose-Capillary: 127 mg/dL — ABNORMAL HIGH (ref 65–99)

## 2015-06-12 LAB — LACTIC ACID, PLASMA: LACTIC ACID, VENOUS: 4 mmol/L — AB (ref 0.5–2.0)

## 2015-06-12 LAB — MRSA PCR SCREENING: MRSA by PCR: NEGATIVE

## 2015-06-12 MED ORDER — PRAVASTATIN SODIUM 20 MG PO TABS
20.0000 mg | ORAL_TABLET | Freq: Every day | ORAL | Status: DC
  Administered 2015-06-13 – 2015-06-16 (×3): 20 mg via ORAL
  Filled 2015-06-12 (×5): qty 1

## 2015-06-12 MED ORDER — PIPERACILLIN-TAZOBACTAM IN DEX 2-0.25 GM/50ML IV SOLN
2.2500 g | Freq: Three times a day (TID) | INTRAVENOUS | Status: DC
  Administered 2015-06-13 – 2015-06-14 (×5): 2.25 g via INTRAVENOUS
  Filled 2015-06-12 (×6): qty 50

## 2015-06-12 MED ORDER — SODIUM CHLORIDE 0.9 % IV BOLUS (SEPSIS)
1000.0000 mL | Freq: Once | INTRAVENOUS | Status: AC
  Administered 2015-06-12: 1000 mL via INTRAVENOUS

## 2015-06-12 MED ORDER — ACETAMINOPHEN 325 MG PO TABS
650.0000 mg | ORAL_TABLET | ORAL | Status: DC | PRN
  Administered 2015-06-12 – 2015-06-19 (×3): 650 mg via ORAL
  Filled 2015-06-12 (×3): qty 2

## 2015-06-12 MED ORDER — HEPARIN SODIUM (PORCINE) 5000 UNIT/ML IJ SOLN
5000.0000 [IU] | Freq: Three times a day (TID) | INTRAMUSCULAR | Status: DC
  Administered 2015-06-12 – 2015-06-25 (×36): 5000 [IU] via SUBCUTANEOUS
  Filled 2015-06-12 (×31): qty 1

## 2015-06-12 MED ORDER — SODIUM CHLORIDE 0.9 % IV BOLUS (SEPSIS)
1000.0000 mL | INTRAVENOUS | Status: AC
  Administered 2015-06-12 (×2): 1000 mL via INTRAVENOUS

## 2015-06-12 MED ORDER — SODIUM CHLORIDE 0.9 % IV SOLN
INTRAVENOUS | Status: DC
  Administered 2015-06-12: 1000 mL via INTRAVENOUS
  Administered 2015-06-13: 11:00:00 via INTRAVENOUS
  Administered 2015-06-14: 1000 mL via INTRAVENOUS

## 2015-06-12 MED ORDER — ONDANSETRON HCL 4 MG/2ML IJ SOLN
4.0000 mg | Freq: Four times a day (QID) | INTRAMUSCULAR | Status: DC | PRN
Start: 2015-06-12 — End: 2015-06-25
  Administered 2015-06-12: 4 mg via INTRAVENOUS
  Filled 2015-06-12: qty 2

## 2015-06-12 MED ORDER — SODIUM CHLORIDE 0.9 % IV SOLN: 250.0000 mL | INTRAVENOUS | Status: DC | PRN

## 2015-06-12 MED ORDER — ENSURE ENLIVE PO LIQD: 237.0000 mL | Freq: Two times a day (BID) | ORAL | Status: DC

## 2015-06-12 MED ORDER — ASPIRIN EC 81 MG PO TBEC
81.0000 mg | DELAYED_RELEASE_TABLET | Freq: Every day | ORAL | Status: DC
  Administered 2015-06-15 – 2015-06-17 (×3): 81 mg via ORAL
  Filled 2015-06-12 (×3): qty 1

## 2015-06-12 MED ORDER — PIPERACILLIN-TAZOBACTAM 3.375 G IVPB 30 MIN
3.3750 g | Freq: Once | INTRAVENOUS | Status: AC
  Administered 2015-06-12: 3.375 g via INTRAVENOUS
  Filled 2015-06-12: qty 50

## 2015-06-12 MED ORDER — SENNA 8.6 MG PO TABS: 1.0000 | ORAL_TABLET | Freq: Two times a day (BID) | ORAL | Status: DC

## 2015-06-12 MED ORDER — ENSURE COMPLETE PO LIQD: 1.0000 | Freq: Two times a day (BID) | ORAL | Status: DC

## 2015-06-12 NOTE — ED Notes (Signed)
Family made aware of pts bed assignment 

## 2015-06-12 NOTE — ED Notes (Signed)
Pt bedsheets changed and pt cleaned and new diaper placed on patient.

## 2015-06-12 NOTE — ED Provider Notes (Signed)
CSN: [id number]     Arrival date & time 06/12/15  1530 History   First MD Initiated Contact with Patient 06/12/15 1558     Chief Complaint  Patient presents with  . Altered Mental Status  . Nausea  . Diarrhea  LEVEL 5 CAVEAT DUE TO ACUITY OF CONDITION   Patient is a 81 y.o. female presenting with altered mental status and diarrhea. The history is provided by a relative. The history is limited by the condition of the patient.  Altered Mental Status Severity:  Severe Timing:  Constant Progression:  Worsening Chronicity:  New Associated symptoms: abdominal pain and vomiting   Associated symptoms: no fever   Associated symptoms comment:  DIARRHEA  Diarrhea Associated symptoms: abdominal pain and vomiting   Associated symptoms: no fever    Patient presents from home She lives with nephew who is caregiver She is usually awake/alert, talkative Today she had multiple episodes of vomiting/diarrhea He felt it was due to recent [place] food she ate However today her diarrhea worsened and she became confused and had brief syncopal episode Stool has been nonbloody Past Medical History  Diagnosis Date  . GERD (gastroesophageal reflux disease)   . Complete rupture of rotator cuff   . Pain in joint, shoulder region   . Chronic kidney disease, stage I   . Iron deficiency anemia, unspecified   . Bereavement, uncomplicated   . Edema   . Hyperglycemia   . Unspecified constipation   . Osteoporosis, unspecified   . OA (osteoarthritis)   . Unspecified essential hypertension   . Other and unspecified hyperlipidemia   . Diverticulosis of colon (without mention of hemorrhage)   . Herpes simplex keratitis 2011   Past Surgical History  Procedure Laterality Date  . Abdominal hysterectomy    . Benign cyst removal from lateral chest    . Eye surgery      cataract   Family History  Problem Relation Age of Onset  . Stroke Father   . Dementia Mother   . Dementia Sister   . Cancer Brother      throat   . Nephrolithiasis Brother    Social History  Substance Use Topics  . Smoking status: Former Research scientist (life sciences)  . Smokeless tobacco: None  . Alcohol Use: No   OB History    No data available     Review of Systems  Unable to perform ROS: Acuity of condition  Constitutional: Negative for fever.  Gastrointestinal: Positive for vomiting, abdominal pain and diarrhea.      Allergies  Review of patient's allergies indicates no known allergies.  Home Medications   Prior to Admission medications   Medication Sig Start Date End Date Taking? Authorizing Provider  acetaminophen (TYLENOL) 500 MG tablet Take 500-1,000 mg by mouth every 6 (six) hours as needed.    Historical Provider, MD  amLODipine (NORVASC) 5 MG tablet Take 1 tablet (5 mg total) by mouth daily. 06/19/14   Alycia Rossetti, MD  aspirin EC 81 MG tablet Take 81 mg by mouth daily.    Historical Provider, MD  benazepril (LOTENSIN) 20 MG tablet Take 1 tablet (20 mg total) by mouth daily. 06/19/14   Alycia Rossetti, MD  feeding supplement, ENSURE COMPLETE, (ENSURE COMPLETE) LIQD Take 237 mLs by mouth 2 (two) times daily between meals. 06/19/14   Alycia Rossetti, MD  nitrofurantoin, macrocrystal-monohydrate, (MACROBID) 100 MG capsule Take 1 capsule (100 mg total) by mouth at bedtime. 06/19/14   Alycia Rossetti, MD  nystatin cream (MYCOSTATIN) Apply 1 application topically 2 (two) times daily. 03/02/14   Alycia Rossetti, MD  pravastatin (PRAVACHOL) 20 MG tablet Take 1 tablet (20 mg total) by mouth at bedtime. 06/19/14   Alycia Rossetti, MD  senna (SENOKOT) 8.6 MG TABS tablet Take 1 tablet (8.6 mg total) by mouth 2 (two) times daily. 06/19/14   Alycia Rossetti, MD   BP 104/84 mmHg  Pulse 33  Temp(Src) 94.3 F (34.6 C) (Rectal)  Resp 34  SpO2 93% Physical Exam CONSTITUTIONAL: elderly, ill appearing, moaning  HEAD: Normocephalic/atraumatic EYES: EOMI ENMT: Mucous membranes dry NECK: supple no meningeal signs SPINE/BACK:entire  spine nontender CV: S1/S2 noted, no murmurs/rubs/gallops noted LUNGS: Lungs are clear to auscultation bilaterally, no apparent distress ABDOMEN: soft, diffuse severe abdominal tenderness [id number] external genitalia, nurse present for exam NEURO: Pt is awake but she is confused and not following all commands.  She moves all extremities  EXTREMITIES: pulses normal/equal, full ROM SKIN: cool to touch PSYCH: unable to assess  ED Course  Procedures   CRITICAL CARE Performed by: Sharyon Cable Total critical care time:  45 Critical care time was exclusive of separately billable procedures and treating other patients. Critical care was necessary to treat or prevent imminent or life-threatening deterioration. Critical care was time spent personally by me on the following activities: development of treatment plan with patient and/or surrogate as well as nursing, discussions with consultants, evaluation of patient's response to treatment, examination of patient, obtaining history from patient or surrogate, ordering and performing treatments and interventions, ordering and review of laboratory studies, ordering and review of radiographic studies, pulse oximetry and re-evaluation of patient's condition. PATIENT WITH ACUTE RENAL FAILURE, SEPSIS, DEHYDRATION  4:31 PM Pt with vomiting/diarrhea since this morning and diffuse abd tenderness She is septic She is hypothermic Lactate >10 D/w critical care. They are aware of patient and will see patient CT imaging pending at this time Pt likely has ischemic bowel I spoke at length with nephew who is caregiver Pt is currently full code with all measures welcomed   Sepsis - Repeat Assessment  Performed at:    1721  Vitals     Blood pressure 104/84, pulse 33, temperature 94.3 F (34.6 C), temperature source Rectal, resp. rate 34, SpO2 93 %.  Heart:     Regular rate and rhythm  Lungs:    CTA  Capillary Refill:   <2 sec  Peripheral  Pulse:   Radial pulse palpable  Skin:     Normal Color  5:24 PM Pt improved Vitals improved Dr Curt Jews with critical care at bedside 5:53 PM Pt admitted to critical care BP is labile Currently SBP at 117 Awaiting admission at this time  Labs Review Labs Reviewed  COMPREHENSIVE METABOLIC PANEL - Abnormal; Notable for the following:    CO2 13 (*)    Glucose, Bld 145 (*)    BUN 48 (*)    Creatinine, Ser 3.10 (*)    AST 48 (*)    GFR calc non Af Amer 12 (*)    GFR calc Af Amer 14 (*)    Anion gap 23 (*)    All other components within normal limits  CBC WITH DIFFERENTIAL/PLATELET - Abnormal; Notable for the following:    WBC 10.7 (*)    RDW 16.4 (*)    Neutro Abs 9.1 (*)    Lymphs Abs 0.6 (*)    All other components within normal limits  I-STAT CG4 LACTIC ACID, ED - Abnormal;  Notable for the following:    Lactic Acid, Venous 10.42 (*)    All other components within normal limits  I-STAT CHEM 8, ED - Abnormal; Notable for the following:    BUN 48 (*)    Creatinine, Ser 2.70 (*)    Glucose, Bld 140 (*)    Calcium, Ion 1.06 (*)    Hemoglobin 15.3 (*)    All other components within normal limits  I-STAT VENOUS BLOOD GAS, ED - Abnormal; Notable for the following:    pH, Ven 7.155 (*)    pCO2, Ven 37.2 (*)    Bicarbonate 13.1 (*)    Acid-base deficit 15.0 (*)    All other components within normal limits  CULTURE, BLOOD (ROUTINE X 2)  CULTURE, BLOOD (ROUTINE X 2)  URINE CULTURE  BLOOD GAS, VENOUS  URINALYSIS, ROUTINE W REFLEX MICROSCOPIC (NOT AT [facility])    Imaging Review Ct Abdomen Pelvis Wo Contrast  06/12/2015  CLINICAL DATA:  Abdominal pain, diarrhea, elevated lactate greater than 10, history diverticulosis of the colon, GERD, hypertension EXAM: CT ABDOMEN AND PELVIS WITHOUT CONTRAST TECHNIQUE: Multidetector CT imaging of the abdomen and pelvis was performed following the standard protocol without IV contrast. Sagittal and coronal MPR images reconstructed from axial  data set. Oral contrast was not administered. COMPARISON:  04/16/2010 FINDINGS: Emphysematous changes at lung bases with minimal bibasilar atelectasis. Eventration RIGHT diaphragm containing liver and RIGHT kidney again noted. Extensive atherosclerotic calcifications aorta and coronary arteries. Large cavernous hemangioma seen at the RIGHT lobe of liver on the previous study is not well visualized on the current exam; hepatic contour appears concave at this site, question volume loss/contraction of this lesion since previous study, potentially measuring 2.0 x 1.8 cm image 18. Low-attenuation lesion centrally within spleen, slightly bilobed appearance, 2.8 x 1.7 cm image 7, not seen on prior noncontrast study but on earlier CT with contrast from 11/30/2008, multiple splenic hemangiomas were identified, 2 of which were located at this position now appear confluent. Gallbladder distended. LEFT renal cyst 1.6 x 1.6 cm image 14. Remainder of liver, spleen, pancreas, kidneys, and LEFT adrenal gland normal. Superior pole of the RIGHT kidney, superior aspect of liver and RIGHT adrenal gland are within the eventration and above imaged field. Hiatal hernia, moderate-sized. Enlargement of cardiac chambers. Increased stool and gas within rectum. Suboptimal assessment of GI tract due to lack of IV and oral contrast and inadequate distention. Suspicion of rectal wall thickening question proctitis. Bowel loops otherwise grossly unremarkable within limitations of exam. No definite free intraperitoneal air or fluid. Intra-abdominal fat and mesenteric fat planes are less distinct than on the previous exam though uncertain if this is due to hypoproteinemia or mild scattered edema Question RIGHT ovarian cyst 2.4 x 3.0 cm image 43. Bladder decompressed. Severe osseous demineralization with advanced degenerative changes of the LEFT hip joint with acetabulum protrusio. Marked compression deformity of L3 vertebral body. IMPRESSION:  Interval partial regression of hepatic hemangioma at RIGHT lobe liver. Slight increase in size of previously identified splenic hemangiomas. Question rectal wall thickening/proctitis, recommend correlation with proctoscopy. Extensive atherosclerotic disease. Question RIGHT ovarian cyst. No other definite intra-abdominal or intrapelvic abnormalities identified on exam limited by a lack of IV and oral contrast. Electronically Signed   By: Lavonia Dana M.D.   On: 06/12/2015 17:05   Dg Chest Portable 1 View  06/12/2015  CLINICAL DATA:  Altered mental status. Abdominal pain this morning with vomiting and diarrhea. EXAM: PORTABLE CHEST 1 VIEW COMPARISON:  Chest x-ray  dated 12/20/2013 FINDINGS: Heart size and pulmonary vascularity are normal. Extensive calcification in the thoracic aorta. The lungs are clear. Diffuse osteopenia. No acute osseous abnormality. IMPRESSION: No acute abnormalities.  Aortic atherosclerosis. Electronically Signed   By: Lorriane Shire M.D.   On: 06/12/2015 16:32   I have personally reviewed and evaluated these images and lab results as part of my medical decision-making.   EKG Interpretation   Date/Time:  Wednesday June 12 2015 15:35:44 EST Ventricular Rate:  92 PR Interval:  165 QRS Duration: 87 QT Interval:  449 QTC Calculation: 555 R Axis:   11 Text Interpretation:  Sinus rhythm Multiple premature complexes, vent &  supraven Consider left ventricular hypertrophy Anterior Q waves, possibly  due to LVH Prolonged QT interval Abnormal ekg Confirmed by Christy Gentles  MD,  Niambi Smoak ([id number]) on 06/12/2015 4:38:21 PM     Medications  sodium chloride 0.9 % bolus 1,000 mL (0 mLs Intravenous Stopped 06/12/15 1713)  piperacillin-tazobactam (ZOSYN) IVPB 3.375 g (0 g Intravenous Stopped 06/12/15 1652)  sodium chloride 0.9 % bolus 1,000 mL (1,000 mLs Intravenous New Bag/Given 06/12/15 1736)    MDM   Final diagnoses:  Sepsis, due to unspecified organism ([place])  AKI (acute kidney  injury) ([place])  Metabolic acidosis  Dehydration    Nursing notes including past medical history and social history reviewed and considered in documentation Labs/vital reviewed myself and considered during evaluation xrays/imaging reviewed by myself and considered during evaluation     Ripley Fraise, MD 06/12/15 1754

## 2015-06-12 NOTE — Progress Notes (Addendum)
eLink Physician-Brief Progress Note Patient Name: [name] [name] DOB: 02-09-1921 MRN: [id number]   Date of Service  06/12/2015  HPI/Events of Note  Multiple watery stools. Patient is on Senokot.   eICU Interventions  Will D/C Senokot.      Intervention Category Intermediate Interventions: Other:  Lysle Dingwall 06/12/2015, 8:25 PM

## 2015-06-12 NOTE — Progress Notes (Signed)
CRITICAL VALUE ALERT  Critical value received:  Lactic Acid 4.0  Date of notification:  06/12/2015  Time of notification:  2215  Critical value read back:Yes.    Nurse who received alert:  Nani Ravens   MD notified (1st page):  PCCM  Time of first page:  2220  MD notified (2nd page):Dr. Emmit Alexanders  Time of second page:  Responding MD:    Time MD responded:

## 2015-06-12 NOTE — ED Notes (Signed)
Bar hugger applied

## 2015-06-12 NOTE — Progress Notes (Signed)
Patient admitted with severe diarrhea. Request sent to on call PCCM for c-diff precaution and to collect stool for testing. Which was denied since patient had laxatives today.

## 2015-06-12 NOTE — H&P (Signed)
PULMONARY / CRITICAL CARE MEDICINE   Name: [name] [name] MRN: [id number] DOB: [birth year]    ADMISSION DATE:  06/12/2015 CONSULTATION DATE:  06/12/2015  REFERRING MD:  Dr. Christy Gentles, EDP  CHIEF COMPLAINT:  Belly pain  HISTORY OF PRESENT ILLNESS:   This is a 81 year old female with a past medical history significant for chronic kidney disease, iron deficiency anemia and hypertension who lives under the care of her nephew who presented to the [facility] emergency department on 06/12/2015 complaining of abdominal pain. Her nephew provides the history as the patient was encephalopathic. He states that one day prior to admission she was in her usual health feeling well and they ate out the night before. Overnight she had no significant difficulty but early in the morning on 06/12/2015 she started had significant nausea, vomiting, diarrhea, and cramping abdominal pain. Because this persisted and because of the severity of her symptoms he brought her to the emergency department. In the emergency department she was found to be tachycardic, hypotensive, had a lactic acid greater than 10, and to be in acute renal failure. She was given IV fluids, antibiotics, a CT scan of her abdomen was performed and pulmonary and critical care medicine was consulted for further evaluation. After receiving IV fluids and antibiotics she felt significant better.  PAST MEDICAL HISTORY :  She  has a past medical history of GERD (gastroesophageal reflux disease); Complete rupture of rotator cuff; Pain in joint, shoulder region; Chronic kidney disease, stage I; Iron deficiency anemia, unspecified; Bereavement, uncomplicated; Edema; Hyperglycemia; Unspecified constipation; Osteoporosis, unspecified; OA (osteoarthritis); Unspecified essential hypertension; Other and unspecified hyperlipidemia; Diverticulosis of colon (without mention of hemorrhage); and Herpes simplex keratitis (2011).  PAST SURGICAL HISTORY: She  has past  surgical history that includes Abdominal hysterectomy; benign cyst removal from lateral chest; and Eye surgery.  No Known Allergies  No current facility-administered medications on file prior to encounter.   Current Outpatient Prescriptions on File Prior to Encounter  Medication Sig  . acetaminophen (TYLENOL) 500 MG tablet Take 500-1,000 mg by mouth every 6 (six) hours as needed.  Marland Kitchen amLODipine (NORVASC) 5 MG tablet Take 1 tablet (5 mg total) by mouth daily.  Marland Kitchen aspirin EC 81 MG tablet Take 81 mg by mouth daily.  . benazepril (LOTENSIN) 20 MG tablet Take 1 tablet (20 mg total) by mouth daily.  . feeding supplement, ENSURE COMPLETE, (ENSURE COMPLETE) LIQD Take 237 mLs by mouth 2 (two) times daily between meals.  . nitrofurantoin, macrocrystal-monohydrate, (MACROBID) 100 MG capsule Take 1 capsule (100 mg total) by mouth at bedtime.  Marland Kitchen nystatin cream (MYCOSTATIN) Apply 1 application topically 2 (two) times daily.  . pravastatin (PRAVACHOL) 20 MG tablet Take 1 tablet (20 mg total) by mouth at bedtime.  . senna (SENOKOT) 8.6 MG TABS tablet Take 1 tablet (8.6 mg total) by mouth 2 (two) times daily.    FAMILY HISTORY: SOCIAL HISTORY:REVIEW OF SYSTEMS:  Cannot obtain due to aphasia  SUBJECTIVE:  As above  VITAL SIGNS: BP 116/90 mmHg  Pulse 80  Temp(Src) 93.6 F (34.2 C) (Rectal)  Resp 35  SpO2 99%  HEMODYNAMICS:    VENTILATOR SETTINGS:    INTAKE / OUTPUT:    PHYSICAL EXAMINATION: General:  Frail, elderly female, no distress Neuro:  Awake, alert, minimally conversant, only answers questions with yes and no, moves all 4 extremities well HEENT:  Normocephalic atraumatic, mucous membranes dry Cardiovascular:  Regular rate and rhythm, slight systolic murmur, no JVD, extremities well-perfused, radial pulses are intact  Lungs:  Clear to auscultation bilaterally, normal effort Abdomen:  Bowel sounds soft, minimally tender Musculoskeletal:  Diminished bulk and tone are consistent with  age Skin:  Normal skin turgor, capillary refill  LABS:  BMET  Recent Labs Lab 06/12/15 1612 06/12/15 1620  NA 142 140  K 4.6 4.5  CL 106 109  CO2 13*  --   BUN 48* 48*  CREATININE 3.10* 2.70*  GLUCOSE 145* 140*    Electrolytes  Recent Labs Lab 06/12/15 1612  CALCIUM 9.1    CBC  Recent Labs Lab 06/12/15 1612 06/12/15 1620  WBC 10.7*  --   HGB 13.4 15.3*  HCT 41.6 45.0  PLT 200  --     Coag's No results for input(s): APTT, INR in the last 168 hours.  Sepsis Markers  Recent Labs Lab 06/12/15 1616 06/12/15 1845  LATICACIDVEN 10.42* 6.10*    ABG No results for input(s): PHART, PCO2ART, PO2ART in the last 168 hours.  Liver Enzymes  Recent Labs Lab 06/12/15 1612  AST 48*  ALT 19  ALKPHOS 84  BILITOT 0.9  ALBUMIN 3.7    Cardiac Enzymes No results for input(s): TROPONINI, PROBNP in the last 168 hours.  Glucose No results for input(s): GLUCAP in the last 168 hours.  Imaging Ct Abdomen Pelvis Wo Contrast  06/12/2015  CLINICAL DATA:  Abdominal pain, diarrhea, elevated lactate greater than 10, history diverticulosis of the colon, GERD, hypertension EXAM: CT ABDOMEN AND PELVIS WITHOUT CONTRAST TECHNIQUE: Multidetector CT imaging of the abdomen and pelvis was performed following the standard protocol without IV contrast. Sagittal and coronal MPR images reconstructed from axial data set. Oral contrast was not administered. COMPARISON:  04/16/2010 FINDINGS: Emphysematous changes at lung bases with minimal bibasilar atelectasis. Eventration RIGHT diaphragm containing liver and RIGHT kidney again noted. Extensive atherosclerotic calcifications aorta and coronary arteries. Large cavernous hemangioma seen at the RIGHT lobe of liver on the previous study is not well visualized on the current exam; hepatic contour appears concave at this site, question volume loss/contraction of this lesion since previous study, potentially measuring 2.0 x 1.8 cm image 18.  Low-attenuation lesion centrally within spleen, slightly bilobed appearance, 2.8 x 1.7 cm image 7, not seen on prior noncontrast study but on earlier CT with contrast from 11/30/2008, multiple splenic hemangiomas were identified, 2 of which were located at this position now appear confluent. Gallbladder distended. LEFT renal cyst 1.6 x 1.6 cm image 14. Remainder of liver, spleen, pancreas, kidneys, and LEFT adrenal gland normal. Superior pole of the RIGHT kidney, superior aspect of liver and RIGHT adrenal gland are within the eventration and above imaged field. Hiatal hernia, moderate-sized. Enlargement of cardiac chambers. Increased stool and gas within rectum. Suboptimal assessment of GI tract due to lack of IV and oral contrast and inadequate distention. Suspicion of rectal wall thickening question proctitis. Bowel loops otherwise grossly unremarkable within limitations of exam. No definite free intraperitoneal air or fluid. Intra-abdominal fat and mesenteric fat planes are less distinct than on the previous exam though uncertain if this is due to hypoproteinemia or mild scattered edema Question RIGHT ovarian cyst 2.4 x 3.0 cm image 43. Bladder decompressed. Severe osseous demineralization with advanced degenerative changes of the LEFT hip joint with acetabulum protrusio. Marked compression deformity of L3 vertebral body. IMPRESSION: Interval partial regression of hepatic hemangioma at RIGHT lobe liver. Slight increase in size of previously identified splenic hemangiomas. Question rectal wall thickening/proctitis, recommend correlation with proctoscopy. Extensive atherosclerotic disease. Question RIGHT ovarian cyst. No other  definite intra-abdominal or intrapelvic abnormalities identified on exam limited by a lack of IV and oral contrast. Electronically Signed   By: Lavonia Dana M.D.   On: 06/12/2015 17:05   Dg Chest Portable 1 View  06/12/2015  CLINICAL DATA:  Altered mental status. Abdominal pain this  morning with vomiting and diarrhea. EXAM: PORTABLE CHEST 1 VIEW COMPARISON:  Chest x-ray dated 12/20/2013 FINDINGS: Heart size and pulmonary vascularity are normal. Extensive calcification in the thoracic aorta. The lungs are clear. Diffuse osteopenia. No acute osseous abnormality. IMPRESSION: No acute abnormalities.  Aortic atherosclerosis. Electronically Signed   By: Lorriane Shire M.D.   On: 06/12/2015 16:32     STUDIES:  06/12/2015 CT scan of the abdomen: There is evidence of proctocolitis, but no evidence of a small bowel obstruction, peritoneal fluid, or perforation. There is extensive evidence of atherosclerotic disease.  CULTURES: 06/12/2015 blood culture 06/12/2015 urine culture  ANTIBIOTICS: 06/12/2015 Zosyn  SIGNIFICANT EVENTS:   LINES/TUBES:   DISCUSSION: This is a 81 year old female with what sounds like fairly advanced dementia and aphasia who lives with her nephew who is brought to the [facility] emergency department on 06/12/2015 with what initially appeared to be has severe sepsis syndrome in the setting of nausea vomiting and diarrhea. She appears to have an acute viral gastroenteritis versus a "food poisoning" type episode. She has rapidly responded to IV fluids and antibiotics as her blood pressure has improved and her mental status has improved. Her CT scan does not show evidence of severe or abdominal catastrophe. In discussion with her nephew regarding her advanced age and multiple comorbid illnesses, it was determine the most appropriate level of care would be IV fluids and supportive care alone. He was agreeable to making her CODE STATUS DO NOT RESUSCITATE as with her advanced age advanced, ICU level of support would not provide medical benefit.  ASSESSMENT / PLAN:  PULMONARY A: No acute issues P:   Monitor O2 saturation  CARDIOVASCULAR A:  Hypertension at baseline, currently normotensive after volume resuscitation P:  Hold antihypertensives Continue IV  fluids at normal saline 75 mL an hour  RENAL A:   Acute kidney injury secondary to volume depletion P:   Monitor BMET and UOP Replace electrolytes as needed Continue IV fluids  GASTROINTESTINAL A:   Acute viral gastroenteritis with proctitis, cannot rule out some degree of mild ischemic bowel considering elevated lactate and atherosclerosis seen on CT abdomen. However, she has recovered well with IV fluids. P:   Serial lactic acid Clear liquid diet Zofran as needed for nausea  HEMATOLOGIC A:   No acute issues P:  Monitor for bleeding  INFECTIOUS A:   Acute viral gastroenteritis Possible sepsis type syndrome but picture most consistent with a virus P:   Continue broad-spectrum antibiotics for now, but would stop tomorrow  ENDOCRINE A:   No acute issues   P:   Monitor glucose  NEUROLOGIC A:   Acute encephalopathy, chronic aphasia P:   Minimize sedating medications Tylenol as needed for pain   FAMILY  - Updates: Her nephew was updated in length by me. We determined her CODE STATUS should be DO NOT RESUSCITATE as detailed above.  PCCM will admit, but TRH to pick up 12/29 AM  Roselie Awkward, MD [place] PCCM Pager: [phone] Cell: [phone] After 3pm or if no response, call [phone]  06/12/2015, 6:50 PM

## 2015-06-12 NOTE — ED Notes (Signed)
Dr. Wickline at bedside.  

## 2015-06-12 NOTE — ED Notes (Signed)
Per [facility], pt from home, caretaker is her nephew. N/V/D since today, nephew thinks its from [place] they had yesterday. Pt normally is alert and talking, at this moment she is moaning and not speaking. Pt is alert. Palpated pressures were in the 80s. EKG showed RBBB, unsure if this is normal.

## 2015-06-12 NOTE — Progress Notes (Signed)
ANTIBIOTIC CONSULT NOTE - INITIAL  Pharmacy Consult for Zosyn Indication: intra-abdominal infection  No Known Allergies  Patient Measurements:   Adjusted Body Weight:   Vital Signs: Temp: 94.3 F (34.6 C) (12/28 1542) Temp Source: Rectal (12/28 1542) BP: 99/79 mmHg (12/28 1537) Pulse Rate: 78 (12/28 1537) Intake/Output from previous day:   Intake/Output from this shift:    Labs: No results for input(s): WBC, HGB, PLT, LABCREA, CREATININE in the last 72 hours. CrCl cannot be calculated (Unknown ideal weight.). No results for input(s): VANCOTROUGH, VANCOPEAK, VANCORANDOM, GENTTROUGH, GENTPEAK, GENTRANDOM, TOBRATROUGH, TOBRAPEAK, TOBRARND, AMIKACINPEAK, AMIKACINTROU, AMIKACIN in the last 72 hours.   Microbiology: No results found for this or any previous visit (from the past 720 hour(s)).  Medical History: Past Medical History  Diagnosis Date  . GERD (gastroesophageal reflux disease)   . Complete rupture of rotator cuff   . Pain in joint, shoulder region   . Chronic kidney disease, stage I   . Iron deficiency anemia, unspecified   . Bereavement, uncomplicated   . Edema   . Hyperglycemia   . Unspecified constipation   . Osteoporosis, unspecified   . OA (osteoarthritis)   . Unspecified essential hypertension   . Other and unspecified hyperlipidemia   . Diverticulosis of colon (without mention of hemorrhage)   . Herpes simplex keratitis 2011    Medications:   (Not in a hospital admission) Scheduled:   Infusions:  . piperacillin-tazobactam    . sodium chloride     Assessment: [name] [name] presents with N/V/D. Pharmacy is consulted to dose zosyn for intra-abdominal infection. Pt is hypothermic to 94.3, LA 10.42, sCr 2.7, CBC pending.  Pt received zosyn 3.375g IV once in the ED.  Goal of Therapy:  Eradication of infection  Plan:  Zosyn 2.25g IV q8h Follow up culture results, renal function and clinical course  [name] [name]. [name] [name], PharmD, [place] Clinical  Pharmacist Pager [phone] 06/12/2015,4:15 PM

## 2015-06-13 DIAGNOSIS — G9340 Encephalopathy, unspecified: Secondary | ICD-10-CM | POA: Diagnosis present

## 2015-06-13 DIAGNOSIS — R197 Diarrhea, unspecified: Secondary | ICD-10-CM | POA: Diagnosis present

## 2015-06-13 DIAGNOSIS — A419 Sepsis, unspecified organism: Secondary | ICD-10-CM | POA: Diagnosis present

## 2015-06-13 DIAGNOSIS — I4891 Unspecified atrial fibrillation: Secondary | ICD-10-CM | POA: Diagnosis present

## 2015-06-13 DIAGNOSIS — N179 Acute kidney failure, unspecified: Secondary | ICD-10-CM | POA: Diagnosis present

## 2015-06-13 DIAGNOSIS — E860 Dehydration: Secondary | ICD-10-CM | POA: Diagnosis present

## 2015-06-13 LAB — BASIC METABOLIC PANEL
ANION GAP: 15 (ref 5–15)
BUN: 51 mg/dL — AB (ref 6–20)
CALCIUM: 7.5 mg/dL — AB (ref 8.9–10.3)
CO2: 14 mmol/L — ABNORMAL LOW (ref 22–32)
Chloride: 113 mmol/L — ABNORMAL HIGH (ref 101–111)
Creatinine, Ser: 3.03 mg/dL — ABNORMAL HIGH (ref 0.44–1.00)
GFR calc Af Amer: 14 mL/min — ABNORMAL LOW (ref >60)
GFR calc non Af Amer: 12 mL/min — ABNORMAL LOW (ref >60)
GLUCOSE: 96 mg/dL (ref 65–99)
Potassium: 5.4 mmol/L — ABNORMAL HIGH (ref 3.5–5.1)
SODIUM: 142 mmol/L (ref 135–145)

## 2015-06-13 LAB — CBC
HCT: 37.3 % (ref 36.0–46.0)
Hemoglobin: 11.5 g/dL — ABNORMAL LOW (ref 12.0–15.0)
MCH: 26 pg (ref 26.0–34.0)
MCHC: 30.8 g/dL (ref 30.0–36.0)
MCV: 84.4 fL (ref 78.0–100.0)
PLATELETS: 163 10*3/uL (ref 150–400)
RBC: 4.42 MIL/uL (ref 3.87–5.11)
RDW: 16.5 % — AB (ref 11.5–15.5)
WBC: 8.1 10*3/uL (ref 4.0–10.5)

## 2015-06-13 LAB — LACTIC ACID, PLASMA
LACTIC ACID, VENOUS: 2.9 mmol/L — AB (ref 0.5–2.0)
Lactic Acid, Venous: 2 mmol/L (ref 0.5–2.0)

## 2015-06-13 LAB — POTASSIUM: POTASSIUM: 5.9 mmol/L — AB (ref 3.5–5.1)

## 2015-06-13 LAB — MAGNESIUM: Magnesium: 2.5 mg/dL — ABNORMAL HIGH (ref 1.7–2.4)

## 2015-06-13 LAB — TSH: TSH: 2.43 u[IU]/mL (ref 0.350–4.500)

## 2015-06-13 MED ORDER — CETYLPYRIDINIUM CHLORIDE 0.05 % MT LIQD
7.0000 mL | Freq: Two times a day (BID) | OROMUCOSAL | Status: DC
  Administered 2015-06-13 – 2015-06-14 (×3): 7 mL via OROMUCOSAL

## 2015-06-13 MED ORDER — SODIUM POLYSTYRENE SULFONATE 15 GM/60ML PO SUSP
45.0000 g | Freq: Once | ORAL | Status: AC
  Administered 2015-06-13: 45 g via RECTAL
  Filled 2015-06-13: qty 180

## 2015-06-13 MED ORDER — CHLORHEXIDINE GLUCONATE 0.12 % MT SOLN
15.0000 mL | Freq: Two times a day (BID) | OROMUCOSAL | Status: DC
  Administered 2015-06-13 – 2015-06-14 (×3): 15 mL via OROMUCOSAL
  Filled 2015-06-13 (×4): qty 15

## 2015-06-13 MED ORDER — METOPROLOL TARTRATE 1 MG/ML IV SOLN
2.5000 mg | Freq: Four times a day (QID) | INTRAVENOUS | Status: DC
  Administered 2015-06-13 – 2015-06-17 (×17): 2.5 mg via INTRAVENOUS
  Filled 2015-06-13 (×16): qty 5

## 2015-06-13 NOTE — Evaluation (Signed)
Clinical/Bedside Swallow Evaluation Patient Details  Name: [name] [name] MRN: [id number] Date of Birth: 1920-10-11  Today's Date: 06/13/2015 Time: SLP Start Time (ACUTE ONLY): 1433 SLP Stop Time (ACUTE ONLY): 1445 SLP Time Calculation (min) (ACUTE ONLY): 12 min  Past Medical History:  Past Medical History  Diagnosis Date  . GERD (gastroesophageal reflux disease)   . Complete rupture of rotator cuff   . Pain in joint, shoulder region   . Chronic kidney disease, stage I   . Iron deficiency anemia, unspecified   . Bereavement, uncomplicated   . Edema   . Hyperglycemia   . Unspecified constipation   . Osteoporosis, unspecified   . OA (osteoarthritis)   . Unspecified essential hypertension   . Other and unspecified hyperlipidemia   . Diverticulosis of colon (without mention of hemorrhage)   . Herpes simplex keratitis 2011   Past Surgical History:  Past Surgical History  Procedure Laterality Date  . Abdominal hysterectomy    . Benign cyst removal from lateral chest    . Eye surgery      cataract   HPI:  81 yo Female with PMH significant for CKD, iron deficiency anemia and HTN who lives under the care of her nephew who presented to ED on 06/12/2015 with abdominal pain, nausea, vomiting and diarrhea.    Assessment / Plan / Recommendation Clinical Impression  Pt is noy ready for PO intake, both from a cognitive and respiratory standpoint. She is lethargic and not following commands throughout this assessment, with poor awareness of boluses presented. She does open her mouth to accept ice chips and spoonfuls of water, but most of the water is anteriorly spilled from her lips. Her RR hovers in the high 30s and even reaches into the low 40s, further increasing her risk for aspiration. Recommend that pt remain NPO for today. Will return for reassessment on next date.    Aspiration Risk  Severe aspiration risk    Diet Recommendation  NPO   Medication Administration: Via  alternative means    Other  Recommendations Oral Care Recommendations: Oral care QID   Follow up Recommendations   (tba)    Frequency and Duration min 2x/week  2 weeks       Prognosis Prognosis for Safe Diet Advancement: Good Barriers to Reach Goals: Severity of deficits      Swallow Study   General HPI: 81 yo Female with PMH significant for CKD, iron deficiency anemia and HTN who lives under the care of her nephew who presented to ED on 06/12/2015 with abdominal pain, nausea, vomiting and diarrhea.  Type of Study: Bedside Swallow Evaluation Previous Swallow Assessment: none in chart Diet Prior to this Study: NPO Temperature Spikes Noted: Yes (100.1) Respiratory Status: Room air History of Recent Intubation: No Behavior/Cognition: Alert;Doesn't follow directions Oral Cavity Assessment: Within Functional Limits Oral Cavity - Dentition: Edentulous Self-Feeding Abilities: Total assist Patient Positioning: Upright in bed Baseline Vocal Quality: Not observed Volitional Cough: Cognitively unable to elicit Volitional Swallow: Unable to elicit    Oral/Motor/Sensory Function Overall Oral Motor/Sensory Function:  (unable to assess)   Ice Chips Ice chips: Impaired Presentation: Spoon Oral Phase Impairments: Poor awareness of bolus;Reduced labial seal Oral Phase Functional Implications: Left anterior spillage;Right anterior spillage Pharyngeal Phase Impairments: Suspected delayed Swallow;Decreased hyoid-laryngeal movement   Thin Liquid Thin Liquid: Impaired Presentation: Spoon Oral Phase Impairments: Poor awareness of bolus;Reduced labial seal Oral Phase Functional Implications: Right anterior spillage;Left anterior spillage Pharyngeal  Phase Impairments: Suspected delayed Swallow;Decreased hyoid-laryngeal movement  Nectar Thick Nectar Thick Liquid: Not tested   Honey Thick Honey Thick Liquid: Not tested   Puree Puree: Not tested   Solid   GO    Solid: Not tested       Germain Osgood, M.A. CCC-SLP [phone]  Germain Osgood 06/13/2015,3:10 PM

## 2015-06-13 NOTE — Progress Notes (Signed)
[place] TEAM 1 - Stepdown/ICU TEAM Progress Note  [name] [name] [id number] DOB: 08/02/1920 DOA: 06/12/2015 PCP: Vic Blackbird, MD  Admit HPI / Brief Narrative: 81 year old BF PMHx CKD stage I, Fe deficiency Anemia, HTN, HLD, Diverticulosis of colon,Herpes Simplex.  Lives under the care of her nephew who presented to the [facility] emergency department on 06/12/2015 complaining of abdominal pain. Her nephew provides the history as the patient was encephalopathic. He states that one day prior to admission she was in her usual health feeling well and they ate out the night before. Overnight she had no significant difficulty but early in the morning on 06/12/2015 she started had significant nausea, vomiting, diarrhea, and cramping abdominal pain. Because this persisted and because of the severity of her symptoms he brought her to the emergency department. In the emergency department she was found to be tachycardic, hypotensive, had a lactic acid greater than 10, and to be in acute renal failure. She was given IV fluids, antibiotics, a CT scan of her abdomen was performed and pulmonary and critical care medicine was consulted for further evaluation. After receiving IV fluids and antibiotics she felt significant better  HPI/Subjective: 12/29 T-max overnight= 38.2, initially hypothermic on admission. Family states that patient's baseline is able to perform ADLs, ambulates with walker, interacts appropriately with family. Patient unresponsive to questions. Patient will open her eyes to painful stimuli. Does not answer questions even when posed by family members.    Assessment/Plan: Sepsis unknown organism -Patient appears to have acute on chronic UTI as possible cause; infectious diarrhea? -Blood cultures pending, urine cultures pending, GI pathogen panel pending -Continue current antibiotic -Lactic acidosis resolving -On admission patient met criteria for sepsis -Continue IV fluids at  normal saline 75 mL an hour  Aspiration of vomitus? -Nothing by mouth -Swallow evaluation pending  Atrial fibrillation unspecified type  -Metoprolol IV 2.5 mg QID will titrate up slowly on this octogenarian -Obtain TSH  Hypertension  -See atrial fibrillation   Acute renal failure  -Most likely secondary to volume depletion -See sepsis  Diarrhea -Possibly secondary to Acute viral gastroenteritis with proctitis, infectious diarrhea?, ischemic bowel R/Oed by abdominal CT  -GI panel by PCR pending -Fecal lactoferrin pending -Consider stopping broad-spectrum antibiotic in the A.m.  Hyperkalemia -Patient slightly hyperkalemic but having diarrhea, will recheck K/Mg -Potassium continues to climb now 5.9; Kayexalate 48 gm per rectum  Acute encephalopathy, -Minimize sedating medications    Code Status: DO NOT RESUSCITATE  Family Communication:  family present at time of exam Disposition Plan: Resolution altered mental status    Consultants: Dr.Douglas Jerral [facility]   Procedure/Significant Events: 06/12/2015 CT scan of the abdomen: Evidence of proctocolitis, negative SBO/peritoneal fluid/perforation.    Culture 12/28 Blood culture 2 pending 12/28 Urine NGTD 12/29 GI panel pending   Antibiotics: 12/28 Zosyn>>   DVT prophylaxis: Subcutaneous heparin   Devices    LINES / TUBES:      Continuous Infusions: . sodium chloride 75 mL/hr at 06/13/15 1036    Objective: VITAL SIGNS: Temp: 99.9 F (37.7 C) (12/29 1931) Temp Source: Axillary (12/29 1931) BP: 116/87 mmHg (12/29 1931) Pulse Rate: 72 (12/29 1931) SPO2; FIO2:   Intake/Output Summary (Last 24 hours) at 06/13/15 2106 Last data filed at 06/13/15 1800  Gross per 24 hour  Intake 1766.25 ml  Output      1 ml  Net 1765.25 ml     Exam: General:  Opens eyes to painful stimuli, but will quickly drifts back  to sleep.No acute respiratory distress Eyes: Negative headache, negative scleral  hemorrhage ENT: Negative Runny nose, negative gingival bleeding, Neck:  Negative scars, masses, torticollis, lymphadenopathy, JVD Lungs: Clear to auscultation bilaterally without wheezes or crackles Cardiovascular:  Irregular irregular rhythm and rate, negative murmur gallop or rub normal S1 and S2 Abdomen: Positive Abdominal pain to palpation , nondistended, positive soft, bowel sounds, no rebound, no ascites, no appreciable mass Extremities: No significant cyanosis, clubbing, or edema bilateral lower extremities Psychiatric:  Unable to evaluate secondary to patient's altered mental status Neurologic:  Patient will move all extremities to painful stimuli, will open eyes to painful stimuli. Unable to evaluate further secondary to altered mental status  Data Reviewed: Basic Metabolic Panel:  Recent Labs Lab 06/12/15 1612 06/12/15 1620 06/12/15 2055 06/13/15 0004 06/13/15 1350  NA 142 140  --  142  --   K 4.6 4.5  --  5.4* 5.9*  CL 106 109  --  113*  --   CO2 13*  --   --  14*  --   GLUCOSE 145* 140*  --  96  --   BUN 48* 48*  --  51*  --   CREATININE 3.10* 2.70* 2.81* 3.03*  --   CALCIUM 9.1  --   --  7.5*  --   MG  --   --   --   --  2.5*   Liver Function Tests:  Recent Labs Lab 06/12/15 1612  AST 48*  ALT 19  ALKPHOS 84  BILITOT 0.9  PROT 6.9  ALBUMIN 3.7   No results for input(s): LIPASE, AMYLASE in the last 168 hours. No results for input(s): AMMONIA in the last 168 hours. CBC:  Recent Labs Lab 06/12/15 1612 06/12/15 1620 06/12/15 2055 06/13/15 0004  WBC 10.7*  --  8.1 8.1  NEUTROABS 9.1*  --   --   --   HGB 13.4 15.3* 11.6* 11.5*  HCT 41.6 45.0 38.1 37.3  MCV 84.9  --  85.2 84.4  PLT 200  --  171 163   Cardiac Enzymes: No results for input(s): CKTOTAL, CKMB, CKMBINDEX, TROPONINI in the last 168 hours. BNP (last 3 results) No results for input(s): BNP in the last 8760 hours.  ProBNP (last 3 results) No results for input(s): PROBNP in the last 8760  hours.  CBG:  Recent Labs Lab 06/12/15 1612  GLUCAP 127*    Recent Results (from the past 240 hour(s))  Urine culture     Status: None (Preliminary result)   Collection Time: 06/12/15  5:59 PM  Result Value Ref Range Status   Specimen Description URINE, CATHETERIZED  Final   Special Requests NONE  Final   Culture >=100,000 COLONIES/mL PROTEUS MIRABILIS  Final   Report Status PENDING  Incomplete  MRSA PCR Screening     Status: None   Collection Time: 06/12/15  7:16 PM  Result Value Ref Range Status   MRSA by PCR NEGATIVE NEGATIVE Final    Comment:        The GeneXpert MRSA Assay (FDA approved for NASAL specimens only), is one component of a comprehensive MRSA colonization surveillance program. It is not intended to diagnose MRSA infection nor to guide or monitor treatment for MRSA infections.      Studies:  Recent x-ray studies have been reviewed in detail by the Attending Physician  Scheduled Meds:  Scheduled Meds: . antiseptic oral rinse  7 mL Mouth Rinse q12n4p  . aspirin EC  81 mg Oral  Daily  . chlorhexidine  15 mL Mouth Rinse BID  . heparin  5,000 Units Subcutaneous 3 times per day  . metoprolol  2.5 mg Intravenous 4 times per day  . piperacillin-tazobactam (ZOSYN)  IV  2.25 g Intravenous Q8H  . pravastatin  20 mg Oral QHS  . sodium polystyrene  45 g Rectal Once    Time spent on care of this patient: 40 mins   Almyra Birman, Geraldo Docker , MD  [facility] Office  [phone] Pager - [phone]  On-Call/Text Page:      [email or web address]      password [email or web address]  If 7PM-7AM, please contact night-coverage [email or web address] Password [email or web address] 06/13/2015, 9:06 PM   LOS: 1 day   Care during the described time interval was provided by me .  I have reviewed this patient's available data, including medical history, events of note, physical examination, and all test results as part of my evaluation. I have personally reviewed and interpreted all radiology studies.   Dia Crawford, MD [phone] Pager

## 2015-06-13 NOTE — Progress Notes (Signed)
CRITICAL VALUE ALERT  Critical value received:  Lactic Acid 2.9  Date of notification:  06/12/2015  Time of notification:  0030  Critical value read back:Yes.    Nurse who received alert:  Nani Ravens RN   MD notified (1st page):  Dr. Emmit Alexanders  Time of first page:  0035  MD notified (2nd page):  Time of second page:  Responding MD:    Time MD responded:

## 2015-06-13 NOTE — Progress Notes (Signed)
Initial Nutrition Assessment  DOCUMENTATION CODES:   Not applicable  INTERVENTION:    Consider Speech Path consult for swallow evaluation   NUTRITION DIAGNOSIS:   Increased nutrient needs related to acute illness as evidenced by estimated needs  GOAL:   Patient will meet greater than or equal to 90% of their needs  MONITOR:   PO intake, Supplement acceptance, Labs, Weight trends, I & O's  REASON FOR ASSESSMENT:   Low Braden  ASSESSMENT:   81 yo Female with PMH significant for CKD, iron deficiency anemia and HTN who lives under the care of her nephew who presented to ED on 06/12/2015 with abdominal pain, nausea, vomiting and diarrhea.   RD unable to obtain nutrition hx at time of visit.  Pt sleeping.  Did not wake.  Nephew sleeping in chair at bedside.  Pt currently on Full Liquid diet.  No % PO intake available.  ? swallowing and/or chewing difficulty given advanced age.  Consider swallow evaluation.  Low braden score places patient at risk for skin breakdown.  RD unable to complete Nutrition Focused Physical Exam at this time.  Diet Order:  Diet full liquid Room service appropriate?: Yes; Fluid consistency:: Thin  Skin:  Reviewed, no issues  Last BM:  12/28  Height:   Ht Readings from Last 1 Encounters:  06/12/15 5' (1.524 m)    Weight:   Wt Readings from Last 1 Encounters:  06/13/15 124 lb 9 oz (56.5 kg)    Ideal Body Weight:  45.4 kg  BMI:  Body mass index is 24.33 kg/(m^2).  Estimated Nutritional Needs:   Kcal:  1200-1400  Protein:  55-65 gm  Fluid:  >/= 1.5 L  EDUCATION NEEDS:   No education needs identified at this time  Arthur Holms, RD, LDN Pager #: [phone] After-Hours Pager #: [phone]

## 2015-06-13 NOTE — Progress Notes (Signed)
Utilization Review Completed.[name] [name] T12/29/2016  

## 2015-06-14 ENCOUNTER — Inpatient Hospital Stay (HOSPITAL_COMMUNITY): Payer: Medicare Other

## 2015-06-14 DIAGNOSIS — E872 Acidosis, unspecified: Secondary | ICD-10-CM | POA: Diagnosis present

## 2015-06-14 DIAGNOSIS — E870 Hyperosmolality and hypernatremia: Secondary | ICD-10-CM | POA: Diagnosis present

## 2015-06-14 DIAGNOSIS — E875 Hyperkalemia: Secondary | ICD-10-CM | POA: Diagnosis present

## 2015-06-14 DIAGNOSIS — A498 Other bacterial infections of unspecified site: Secondary | ICD-10-CM | POA: Diagnosis present

## 2015-06-14 DIAGNOSIS — B964 Proteus (mirabilis) (morganii) as the cause of diseases classified elsewhere: Secondary | ICD-10-CM

## 2015-06-14 DIAGNOSIS — N179 Acute kidney failure, unspecified: Secondary | ICD-10-CM | POA: Diagnosis present

## 2015-06-14 LAB — COMPREHENSIVE METABOLIC PANEL
ALK PHOS: 50 U/L (ref 38–126)
ALT: 23 U/L (ref 14–54)
ALT: 21 U/L (ref 14–54)
ANION GAP: 18 — AB (ref 5–15)
AST: 62 U/L — AB (ref 15–41)
AST: 58 U/L — ABNORMAL HIGH (ref 15–41)
Albumin: 2.7 g/dL — ABNORMAL LOW (ref 3.5–5.0)
Albumin: 2.4 g/dL — ABNORMAL LOW (ref 3.5–5.0)
Alkaline Phosphatase: 49 U/L (ref 38–126)
BILIRUBIN TOTAL: 1 mg/dL (ref 0.3–1.2)
BUN: UNDETERMINED mg/dL (ref 6–20)
BUN: 74 mg/dL — ABNORMAL HIGH (ref 6–20)
CALCIUM: UNDETERMINED mg/dL (ref 8.9–10.3)
CHLORIDE: 118 mmol/L — AB (ref 101–111)
CO2: UNDETERMINED mmol/L (ref 22–32)
CO2: 12 mmol/L — ABNORMAL LOW (ref 22–32)
CREATININE: 4.89 mg/dL — AB (ref 0.44–1.00)
Calcium: 7.4 mg/dL — ABNORMAL LOW (ref 8.9–10.3)
Chloride: UNDETERMINED mmol/L (ref 101–111)
Creatinine, Ser: 4.84 mg/dL — ABNORMAL HIGH (ref 0.44–1.00)
GFR calc Af Amer: 8 mL/min — ABNORMAL LOW (ref >60)
GFR, EST AFRICAN AMERICAN: 8 mL/min — AB (ref >60)
GFR, EST NON AFRICAN AMERICAN: 7 mL/min — AB (ref >60)
GFR, EST NON AFRICAN AMERICAN: 7 mL/min — AB (ref >60)
GLUCOSE: 77 mg/dL (ref 65–99)
Glucose, Bld: 79 mg/dL (ref 65–99)
Potassium: UNDETERMINED mmol/L (ref 3.5–5.1)
Potassium: 4.8 mmol/L (ref 3.5–5.1)
Sodium: UNDETERMINED mmol/L (ref 135–145)
Sodium: 148 mmol/L — ABNORMAL HIGH (ref 135–145)
TOTAL PROTEIN: 5.8 g/dL — AB (ref 6.5–8.1)
TOTAL PROTEIN: 5.5 g/dL — AB (ref 6.5–8.1)
Total Bilirubin: 1 mg/dL (ref 0.3–1.2)

## 2015-06-14 LAB — BLOOD GAS, ARTERIAL
ACID-BASE DEFICIT: 14.7 mmol/L — AB (ref 0.0–2.0)
Bicarbonate: 10.4 mEq/L — ABNORMAL LOW (ref 20.0–24.0)
Drawn by: 365291
O2 Content: 3 L/min
O2 SAT: 94.4 %
PATIENT TEMPERATURE: 98.6
PCO2 ART: 20.9 mmHg — AB (ref 35.0–45.0)
PO2 ART: 85.5 mmHg (ref 80.0–100.0)
TCO2: 11 mmol/L (ref 0–100)
pH, Arterial: 7.316 — ABNORMAL LOW (ref 7.350–7.450)

## 2015-06-14 LAB — CBC WITH DIFFERENTIAL/PLATELET
BASOS ABS: 0 10*3/uL (ref 0.0–0.1)
BASOS ABS: 0 10*3/uL (ref 0.0–0.1)
BASOS PCT: 0 %
Basophils Relative: 0 %
EOS ABS: 0 10*3/uL (ref 0.0–0.7)
Eosinophils Absolute: 0 10*3/uL (ref 0.0–0.7)
Eosinophils Relative: 0 %
Eosinophils Relative: 0 %
HCT: 35.2 % — ABNORMAL LOW (ref 36.0–46.0)
HEMATOCRIT: 32.2 % — AB (ref 36.0–46.0)
HEMOGLOBIN: 11.5 g/dL — AB (ref 12.0–15.0)
HEMOGLOBIN: 10.1 g/dL — AB (ref 12.0–15.0)
LYMPHS ABS: 0.5 10*3/uL — AB (ref 0.7–4.0)
LYMPHS ABS: 0.5 10*3/uL — AB (ref 0.7–4.0)
LYMPHS PCT: 7 %
Lymphocytes Relative: 7 %
MCH: 26.7 pg (ref 26.0–34.0)
MCH: 26 pg (ref 26.0–34.0)
MCHC: 32.7 g/dL (ref 30.0–36.0)
MCHC: 31.4 g/dL (ref 30.0–36.0)
MCV: 81.7 fL (ref 78.0–100.0)
MCV: 82.8 fL (ref 78.0–100.0)
MONO ABS: 0.2 10*3/uL (ref 0.1–1.0)
MONOS PCT: 3 %
MONOS PCT: 5 %
Monocytes Absolute: 0.3 10*3/uL (ref 0.1–1.0)
NEUTROS ABS: 6.3 10*3/uL (ref 1.7–7.7)
NEUTROS ABS: 6 10*3/uL (ref 1.7–7.7)
Neutrophils Relative %: 90 %
Neutrophils Relative %: 88 %
Platelets: 105 10*3/uL — ABNORMAL LOW (ref 150–400)
Platelets: 137 10*3/uL — ABNORMAL LOW (ref 150–400)
RBC: 4.31 MIL/uL (ref 3.87–5.11)
RBC: 3.89 MIL/uL (ref 3.87–5.11)
RDW: 17.2 % — AB (ref 11.5–15.5)
RDW: 17.2 % — ABNORMAL HIGH (ref 11.5–15.5)
WBC Morphology: INCREASED
WBC: 7 10*3/uL (ref 4.0–10.5)
WBC: 6.8 10*3/uL (ref 4.0–10.5)

## 2015-06-14 LAB — GASTROINTESTINAL PANEL BY PCR, STOOL (REPLACES STOOL CULTURE)
ADENOVIRUS F40/41: NOT DETECTED
ASTROVIRUS: NOT DETECTED
CAMPYLOBACTER SPECIES: NOT DETECTED
CYCLOSPORA CAYETANENSIS: NOT DETECTED
Cryptosporidium: NOT DETECTED
E. coli O157: NOT DETECTED
ENTEROPATHOGENIC E COLI (EPEC): NOT DETECTED
ENTEROTOXIGENIC E COLI (ETEC): NOT DETECTED
Entamoeba histolytica: NOT DETECTED
Enteroaggregative E coli (EAEC): NOT DETECTED
Giardia lamblia: NOT DETECTED
Norovirus GI/GII: NOT DETECTED
PLESIMONAS SHIGELLOIDES: NOT DETECTED
ROTAVIRUS A: NOT DETECTED
SHIGA LIKE TOXIN PRODUCING E COLI (STEC): NOT DETECTED
Salmonella species: NOT DETECTED
Sapovirus (I, II, IV, and V): NOT DETECTED
Shigella/Enteroinvasive E coli (EIEC): NOT DETECTED
VIBRIO SPECIES: NOT DETECTED
Vibrio cholerae: NOT DETECTED
Yersinia enterocolitica: NOT DETECTED

## 2015-06-14 LAB — LACTIC ACID, PLASMA
LACTIC ACID, VENOUS: 1.4 mmol/L (ref 0.5–2.0)
Lactic Acid, Venous: 1.2 mmol/L (ref 0.5–2.0)

## 2015-06-14 LAB — URINE CULTURE

## 2015-06-14 LAB — MAGNESIUM: MAGNESIUM: 2.5 mg/dL — AB (ref 1.7–2.4)

## 2015-06-14 MED ORDER — CHLORHEXIDINE GLUCONATE 0.12 % MT SOLN
15.0000 mL | Freq: Two times a day (BID) | OROMUCOSAL | Status: DC
  Administered 2015-06-14 – 2015-06-25 (×22): 15 mL via OROMUCOSAL
  Filled 2015-06-14 (×13): qty 15

## 2015-06-14 MED ORDER — DEXTROSE 5 % IV SOLN
1.0000 g | INTRAVENOUS | Status: DC
  Administered 2015-06-14 – 2015-06-18 (×5): 1 g via INTRAVENOUS
  Filled 2015-06-14 (×7): qty 10

## 2015-06-14 MED ORDER — DEXTROSE IN LACTATED RINGERS 5 % IV SOLN
INTRAVENOUS | Status: DC
  Administered 2015-06-14: 16:00:00 via INTRAVENOUS

## 2015-06-14 MED ORDER — DEXTROSE 5 % IV SOLN
INTRAVENOUS | Status: DC
  Administered 2015-06-14: 14:00:00 via INTRAVENOUS

## 2015-06-14 MED ORDER — CETYLPYRIDINIUM CHLORIDE 0.05 % MT LIQD
7.0000 mL | Freq: Two times a day (BID) | OROMUCOSAL | Status: DC
  Administered 2015-06-15 – 2015-06-25 (×16): 7 mL via OROMUCOSAL

## 2015-06-14 NOTE — Progress Notes (Signed)
[facility] TEAM 1 - Stepdown/ICU TEAM Progress Note  [name] [name] NLZ:[id number] DOB: [birth year] DOA: 06/12/2015 PCP: Vic Blackbird, MD  Admit HPI / Brief Narrative: 81 year old BF PMHx CKD stage I, Fe deficiency Anemia, HTN, HLD, Diverticulosis of colon,Herpes Simplex.  Lives under the care of her nephew who presented to the [facility] emergency department on 06/12/2015 complaining of abdominal pain. Her nephew provides the history as the patient was encephalopathic. He states that one day prior to admission she was in her usual health feeling well and they ate out the night before. Overnight she had no significant difficulty but early in the morning on 06/12/2015 she started had significant nausea, vomiting, diarrhea, and cramping abdominal pain. Because this persisted and because of the severity of her symptoms he brought her to the emergency department. In the emergency department she was found to be tachycardic, hypotensive, had a lactic acid greater than 10, and to be in acute renal failure. She was given IV fluids, antibiotics, a CT scan of her abdomen was performed and pulmonary and critical care medicine was consulted for further evaluation. After receiving IV fluids and antibiotics she felt significant better  HPI/Subjective: 12/30 T-max overnight= 38.2, initially hypothermic on admission. Patient much more alert today. A/O 1 (does not know where, when, why), patient does attempt to enter questions. Follows most commands    Assessment/Plan: Sepsis unknown organism -Urine positive Proteus Mirabilis; Acute on Chronic UTI ; infectious diarrhea? -Blood cultures pending, GI pathogen panel pending -Continue current antibiotic -Lactic acidosis resolving -On admission patient met criteria for sepsis -DC normal saline; start D5LR at 50 mL/hr per nephrology recommendation -Strict in and out; since admission +4.7 L (not accurate; patient incontinent, no Foley 2dary UTI ); will need  to be accurate insert Foley -Daily weight - Aspiration of vomitus? -12/30 patient failed swallow evaluation continue Nothing by mouth -PCXR; new left pleural effusion see results below    Anion gap acute metabolic acidosis -likely 2/2 starvation ketoacidosis + Lactate + diarrhea + AKI  Atrial fibrillation unspecified type  -Metoprolol IV 2.5 mg QID will titrate up slowly on this octogenarian -TSH WNL  Hypertension  -See atrial fibrillation   Acute renal failure  -Most likely secondary to volume depletion -See sepsis -12/30 worsening renal failure obtain renal ultrasound, consult Nephrology -See metabolic acidosis   Diarrhea -Possibly secondary to Acute viral gastroenteritis with proctitis, infectious diarrhea?, ischemic bowel R/Oed by abdominal CT  -GI panel by PCR pending -Fecal lactoferrin pending  Hyperkalemia -Resolved with Kayexalate treatment yesterday   Hypernatremia -See sepsis  Acute encephalopathy, -Minimize sedating medications    Code Status: DO NOT RESUSCITATE  Family Communication:  family present at time of exam Disposition Plan: Resolution altered mental status    Consultants: Dr.Douglas Jerral [facility]   Procedure/Significant Events: 06/12/2015 CT scan of the abdomen: Evidence of proctocolitis, negative SBO/peritoneal fluid/perforation. 12/30 PCXR; new moderate left pleural effusion. -Chronic posterior right diaphragmatic hernia containing a portion of liver, right kidney and right adrenal gland.    Culture 12/28 Blood culture left/right arm NGTD 12/28 Urine positive Proteus Mirabilis  12/29 GI panel pending   Antibiotics: 12/28 Zosyn>> 12/30 12/30 ceftriaxone>>   DVT prophylaxis: Subcutaneous heparin   Devices    LINES / TUBES:      Continuous Infusions: . dextrose 5% lactated ringers 50 mL/hr at 06/14/15 1625    Objective: VITAL SIGNS: Temp: 98.8 F (37.1 C) (12/30 1630) Temp  Source: Oral (12/30 1630)  BP: 91/64 mmHg (12/30 1136) Pulse Rate: 104 (12/30 1136) SPO2; FIO2:   Intake/Output Summary (Last 24 hours) at 06/14/15 1924 Last data filed at 06/14/15 1900  Gross per 24 hour  Intake 1256.67 ml  Output      1 ml  Net 1255.67 ml     Exam: General:  A/O 1 (does not know where, when, why), patient does attempt to enter questions. Follows most commands .No acute respiratory distress Eyes: Negative headache, negative scleral hemorrhage ENT: Negative Runny nose, negative gingival bleeding, Neck:  Negative scars, masses, torticollis, lymphadenopathy, JVD Lungs: Clear to auscultation bilaterally without wheezes or crackles Cardiovascular:  Irregular irregular rhythm and rate, negative murmur gallop or rub normal S1 and S2 Abdomen: Positive Abdominal pain to palpation , nondistended, positive soft, bowel sounds, no rebound, no ascites, no appreciable mass Extremities: No significant cyanosis, clubbing, or edema bilateral lower extremities Psychiatric:  Unable to evaluate secondary to patient's altered mental status Neurologic:  Patient will move all extremities to painful stimuli, will open eyes to painful stimuli. Unable to evaluate further secondary to altered mental status  Data Reviewed: Basic Metabolic Panel:  Recent Labs Lab 06/12/15 1612 06/12/15 1620 06/12/15 2055 06/13/15 0004 06/13/15 1350 06/14/15 0449 06/14/15 0815  NA 142 140  --  142  --  QUANTITY NOT SUFFICIENT, UNABLE TO PERFORM TEST 148*  K 4.6 4.5  --  5.4* 5.9* QUANTITY NOT SUFFICIENT, UNABLE TO PERFORM TEST 4.8  CL 106 109  --  113*  --  QUANTITY NOT SUFFICIENT, UNABLE TO PERFORM TEST 118*  CO2 13*  --   --  14*  --  QUANTITY NOT SUFFICIENT, UNABLE TO PERFORM TEST 12*  GLUCOSE 145* 140*  --  96  --  77 79  BUN 48* 48*  --  51*  --  QUANTITY NOT SUFFICIENT, UNABLE TO PERFORM TEST 74*  CREATININE 3.10* 2.70* 2.81* 3.03*  --  4.89* 4.84*  CALCIUM 9.1  --   --  7.5*  --  QUANTITY NOT  SUFFICIENT, UNABLE TO PERFORM TEST 7.4*  MG  --   --   --   --  2.5* 2.5*  --    Liver Function Tests:  Recent Labs Lab 06/12/15 1612 06/14/15 0449 06/14/15 0815  AST 48* 62* 58*  ALT 19 23 21   ALKPHOS 84 50 49  BILITOT 0.9 1.0 1.0  PROT 6.9 5.8* 5.5*  ALBUMIN 3.7 2.7* 2.4*   No results for input(s): LIPASE, AMYLASE in the last 168 hours. No results for input(s): AMMONIA in the last 168 hours. CBC:  Recent Labs Lab 06/12/15 1612 06/12/15 1620 06/12/15 2055 06/13/15 0004 06/14/15 0449 06/14/15 0815  WBC 10.7*  --  8.1 8.1 7.0 6.8  NEUTROABS 9.1*  --   --   --  6.3 6.0  HGB 13.4 15.3* 11.6* 11.5* 11.5* 10.1*  HCT 41.6 45.0 38.1 37.3 35.2* 32.2*  MCV 84.9  --  85.2 84.4 81.7 82.8  PLT 200  --  171 163 105* 137*   Cardiac Enzymes: No results for input(s): CKTOTAL, CKMB, CKMBINDEX, TROPONINI in the last 168 hours. BNP (last 3 results) No results for input(s): BNP in the last 8760 hours.  ProBNP (last 3 results) No results for input(s): PROBNP in the last 8760 hours.  CBG:  Recent Labs Lab 06/12/15 1612  GLUCAP 127*    Recent Results (from the past 240 hour(s))  Culture, blood (routine x 2)     Status: None (Preliminary result)  Collection Time: 06/12/15  4:05 PM  Result Value Ref Range Status   Specimen Description BLOOD LEFT ARM  Final   Special Requests IN PEDIATRIC BOTTLE 2CC  Final   Culture NO GROWTH 2 DAYS  Final   Report Status PENDING  Incomplete  Culture, blood (routine x 2)     Status: None (Preliminary result)   Collection Time: 06/12/15  4:12 PM  Result Value Ref Range Status   Specimen Description BLOOD RIGHT ARM  Final   Special Requests IN PEDIATRIC BOTTLE 2CC  Final   Culture NO GROWTH 2 DAYS  Final   Report Status PENDING  Incomplete  Urine culture     Status: None   Collection Time: 06/12/15  5:59 PM  Result Value Ref Range Status   Specimen Description URINE, CATHETERIZED  Final   Special Requests NONE  Final   Culture >=100,000  COLONIES/mL PROTEUS MIRABILIS  Final   Report Status 06/14/2015 FINAL  Final   Organism ID, Bacteria PROTEUS MIRABILIS  Final      Susceptibility   Proteus mirabilis - MIC*    AMPICILLIN <=2 SENSITIVE Sensitive     CEFAZOLIN <=4 SENSITIVE Sensitive     CEFTRIAXONE <=1 SENSITIVE Sensitive     CIPROFLOXACIN <=0.25 SENSITIVE Sensitive     GENTAMICIN <=1 SENSITIVE Sensitive     IMIPENEM <=0.25 SENSITIVE Sensitive     NITROFURANTOIN 256 RESISTANT Resistant     TRIMETH/SULFA <=20 SENSITIVE Sensitive     AMPICILLIN/SULBACTAM <=2 SENSITIVE Sensitive     * >=100,000 COLONIES/mL PROTEUS MIRABILIS  MRSA PCR Screening     Status: None   Collection Time: 06/12/15  7:16 PM  Result Value Ref Range Status   MRSA by PCR NEGATIVE NEGATIVE Final    Comment:        The GeneXpert MRSA Assay (FDA approved for NASAL specimens only), is one component of a comprehensive MRSA colonization surveillance program. It is not intended to diagnose MRSA infection nor to guide or monitor treatment for MRSA infections.   Gastrointestinal Panel by PCR , Stool     Status: None   Collection Time: 06/13/15  3:32 PM  Result Value Ref Range Status   Campylobacter species NOT DETECTED NOT DETECTED Final   Plesimonas shigelloides NOT DETECTED NOT DETECTED Final   Salmonella species NOT DETECTED NOT DETECTED Final   Yersinia enterocolitica NOT DETECTED NOT DETECTED Final   Vibrio species NOT DETECTED NOT DETECTED Final   Vibrio cholerae NOT DETECTED NOT DETECTED Final   Enteroaggregative E coli (EAEC) NOT DETECTED NOT DETECTED Final   Enteropathogenic E coli (EPEC) NOT DETECTED NOT DETECTED Final   Enterotoxigenic E coli (ETEC) NOT DETECTED NOT DETECTED Final   Shiga like toxin producing E coli (STEC) NOT DETECTED NOT DETECTED Final   E. coli O157 NOT DETECTED NOT DETECTED Final   Shigella/Enteroinvasive E coli (EIEC) NOT DETECTED NOT DETECTED Final   Cryptosporidium NOT DETECTED NOT DETECTED Final   Cyclospora  cayetanensis NOT DETECTED NOT DETECTED Final   Entamoeba histolytica NOT DETECTED NOT DETECTED Final   Giardia lamblia NOT DETECTED NOT DETECTED Final   Adenovirus F40/41 NOT DETECTED NOT DETECTED Final   Astrovirus NOT DETECTED NOT DETECTED Final   Norovirus GI/GII NOT DETECTED NOT DETECTED Final   Rotavirus A NOT DETECTED NOT DETECTED Final   Sapovirus (I, II, IV, and V) NOT DETECTED NOT DETECTED Final     Studies:  Recent x-ray studies have been reviewed in detail by the Attending Physician  Scheduled Meds:  Scheduled Meds: . antiseptic oral rinse  7 mL Mouth Rinse q12n4p  . aspirin EC  81 mg Oral Daily  . cefTRIAXone (ROCEPHIN)  IV  1 g Intravenous Q24H  . chlorhexidine  15 mL Mouth Rinse BID  . heparin  5,000 Units Subcutaneous 3 times per day  . metoprolol  2.5 mg Intravenous 4 times per day  . pravastatin  20 mg Oral QHS    Time spent on care of this patient: 40 mins   Bless Lisenby, Geraldo Docker , MD  [facility] Office  [phone] Pager [phone]  On-Call/Text Page:      [email or web address]      password [email or web address]  If 7PM-7AM, please contact night-coverage [email or web address] Password [email or web address] 06/14/2015, 7:24 PM   LOS: 2 days   Care during the described time interval was provided by me .  I have reviewed this patient's available data, including medical history, events of note, physical examination, and all test results as part of my evaluation. I have personally reviewed and interpreted all radiology studies.   Dia Crawford, MD [phone] Pager

## 2015-06-14 NOTE — Consult Note (Addendum)
[name] [name] Admit Date: 06/12/2015 06/14/2015 [name] [name] Requesting Physician:  [name] Hammers MD  Reason for Consult:  AKI, AMS, UTI HPI:  81 year old female seen at the request of Dr. Sherral [name] for the evaluation of the above issues. Patient was admitted on 12/28 with altered mental status, diarrhea, hypotension. She has been found to have a Proteus urinary tract infection. She has developed acute kidney injury with trend in serum creatinine listed below. She has remained encephalopathic and unable to participate in the evaluation. No family is present at the current time. She has not been exposed to IV contrast. She is on appropriate antibiotics, ceftriaxone. Home medicines include benazapril. No mention in the chart of home nonsteroidal use but not able to confirm. She has received hydration and is not 4.7 L positive we are unable to accurately document output. CT of the abdomen upon presentation do not specifically comment on kidneys but did note a decompressed bladder.  CREAT (mg/dL)  Date Value  04/10/2015 1.02*  06/19/2014 1.06  08/07/2013 0.99  09/12/2012 0.97  12/03/2011 0.94   CREATININE, SER (mg/dL)  Date Value  06/14/2015 4.84*  06/14/2015 4.89*  06/13/2015 3.03*  06/12/2015 2.81*  06/12/2015 2.70*  06/12/2015 3.10*  12/23/2013 0.86  12/22/2013 0.93  12/21/2013 0.97  12/20/2013 1.24*  ] I/Os:  ROS NSAIDS: no exposure idenitfied IV Contrast no exposure TMP/SMX no exposure Hypotension mild, present Balance of 12 systems is negative w/ exceptions as above  PMH  Past Medical History  Diagnosis Date  . GERD (gastroesophageal reflux disease)   . Complete rupture of rotator cuff   . Pain in joint, shoulder region   . Chronic kidney disease, stage I   . Iron deficiency anemia, unspecified   . Bereavement, uncomplicated   . Edema   . Hyperglycemia   . Unspecified constipation   . Osteoporosis, unspecified   . OA (osteoarthritis)   . Unspecified essential  hypertension   . Other and unspecified hyperlipidemia   . Diverticulosis of colon (without mention of hemorrhage)   . Herpes simplex keratitis 2011   PSH  Past Surgical History  Procedure Laterality Date  . Abdominal hysterectomy    . Benign cyst removal from lateral chest    . Eye surgery      cataract   FH  Family History  Problem Relation Age of Onset  . Stroke Father   . Dementia Mother   . Dementia Sister   . Cancer Brother     throat   . Nephrolithiasis Brother    SH  reports that she has quit smoking. She does not have any smokeless tobacco history on file. She reports that she does not drink alcohol or use illicit drugs. Allergies No Known Allergies Home medications Prior to Admission medications   Medication Sig Start Date End Date Taking? Authorizing Provider  acetaminophen (TYLENOL) 500 MG tablet Take 500-1,000 mg by mouth every 6 (six) hours as needed.    Historical Provider, MD  amLODipine (NORVASC) 5 MG tablet Take 1 tablet (5 mg total) by mouth daily. 06/19/14   Alycia Rossetti, MD  aspirin EC 81 MG tablet Take 81 mg by mouth daily.    Historical Provider, MD  benazepril (LOTENSIN) 20 MG tablet Take 1 tablet (20 mg total) by mouth daily. 06/19/14   Alycia Rossetti, MD  feeding supplement, ENSURE COMPLETE, (ENSURE COMPLETE) LIQD Take 237 mLs by mouth 2 (two) times daily between meals. 06/19/14   Alycia Rossetti, MD  nitrofurantoin,  macrocrystal-monohydrate, (MACROBID) 100 MG capsule Take 1 capsule (100 mg total) by mouth at bedtime. 06/19/14   Alycia Rossetti, MD  nystatin cream (MYCOSTATIN) Apply 1 application topically 2 (two) times daily. 03/02/14   Alycia Rossetti, MD  pravastatin (PRAVACHOL) 20 MG tablet Take 1 tablet (20 mg total) by mouth at bedtime. 06/19/14   Alycia Rossetti, MD  senna (SENOKOT) 8.6 MG TABS tablet Take 1 tablet (8.6 mg total) by mouth 2 (two) times daily. 06/19/14   Alycia Rossetti, MD    Current Medications Scheduled Meds: . antiseptic oral  rinse  7 mL Mouth Rinse q12n4p  . aspirin EC  81 mg Oral Daily  . cefTRIAXone (ROCEPHIN)  IV  1 g Intravenous Q24H  . chlorhexidine  15 mL Mouth Rinse BID  . heparin  5,000 Units Subcutaneous 3 times per day  . metoprolol  2.5 mg Intravenous 4 times per day  . pravastatin  20 mg Oral QHS   Continuous Infusions: . dextrose 50 mL/hr at 06/14/15 1352   PRN Meds:.sodium chloride, acetaminophen, ondansetron (ZOFRAN) IV  CBC  Recent Labs Lab 06/12/15 1612  06/13/15 0004 06/14/15 0449 06/14/15 0815  WBC 10.7*  < > 8.1 7.0 6.8  NEUTROABS 9.1*  --   --  6.3 6.0  HGB 13.4  < > 11.5* 11.5* 10.1*  HCT 41.6  < > 37.3 35.2* 32.2*  MCV 84.9  < > 84.4 81.7 82.8  PLT 200  < > 163 105* 137*  < > = values in this interval not displayed. Basic Metabolic Panel  Recent Labs Lab 06/12/15 1612 06/12/15 1620 06/12/15 2055 06/13/15 0004 06/13/15 1350 06/14/15 0449 06/14/15 0815  NA 142 140  --  142  --  QUANTITY NOT SUFFICIENT, UNABLE TO PERFORM TEST 148*  K 4.6 4.5  --  5.4* 5.9* QUANTITY NOT SUFFICIENT, UNABLE TO PERFORM TEST 4.8  CL 106 109  --  113*  --  QUANTITY NOT SUFFICIENT, UNABLE TO PERFORM TEST 118*  CO2 13*  --   --  14*  --  QUANTITY NOT SUFFICIENT, UNABLE TO PERFORM TEST 12*  GLUCOSE 145* 140*  --  96  --  77 79  BUN 48* 48*  --  51*  --  QUANTITY NOT SUFFICIENT, UNABLE TO PERFORM TEST 74*  CREATININE 3.10* 2.70* 2.81* 3.03*  --  4.89* 4.84*  CALCIUM 9.1  --   --  7.5*  --  QUANTITY NOT SUFFICIENT, UNABLE TO PERFORM TEST 7.4*    Physical Exam  Blood pressure 91/64, pulse 104, temperature 98.1 F (36.7 C), temperature source Axillary, resp. rate 28, height 5' (1.524 m), weight 53.7 kg (118 lb 6.2 oz), SpO2 91 %. GEN: somnolent, doesn't participaite in exam ENT: NCAT, poor dentition EYES: EOMI CV: tachy, irregular, no rub PULM: coars bs/ bl ABD: s/nt/nd SKIN: no rashes/lesions EXT:no edema GU: no foley   Assessment 58F with AKI (nl BL SCr) in setting of UTI,  Hypovolemia, Chronic ACEi use, Diarrhea, and found to have inc AG Met Acidosis  1. AKI, nl BL SCr, presumed 2/2 hypovolemia + ACEi, now likley ATN 2. Inc AG Met Acidosis likely 2/2 starvation ketoacidosis + Lactate + diarrhea + AKI 3. Proteus UTI on Ceftriaxone 4. Acute Diarrhea 5. AMS 6. HTN  Plan 1. Change to D5LR to add some base into IVFs, don't think pt is hypervolemic 2. Renal US Daily weights, Daily Renal Panel, Strict I/Os, Avoid nephrotoxins (NSAIDs, judicious IV Contrast)  Pearson Grippe MD [phone] pgr 06/14/2015, 3:21 PM

## 2015-06-14 NOTE — Progress Notes (Signed)
Speech Language Pathology Treatment: Dysphagia  Patient Details Name: [name] [name] MRN: [id number] DOB: 03/28/1921 Today's Date: 06/14/2015 Time: [id number] SLP Time Calculation (min) (ACUTE ONLY): 10 min  Assessment / Plan / Recommendation Clinical Impression  Pt shows some improvements from previous date, including decreased RR as well as increased verbal output and alertness. Verbal expression is mumbled but when intelligible appears to be echolalic and perseverative. She continues to need Max-Total A for command following. Oral care provided to remove small amounts of dried secretions from oral cavity. Pt has oral holding with ice chips, until they ultimately melt onto her tongue. Small amounts of oral manipulation are present, but oral phase overall remains passive. She does exhibit an intermittent pharyngeal swallow, but this appears to be significantly delayed. Recommend to continue NPO. Will f/u to assess readiness to initiate diet as mentation continues to improve.   HPI HPI: 81 yo Female with PMH significant for CKD, iron deficiency anemia and HTN who lives under the care of her nephew who presented to ED on 06/12/2015 with abdominal pain, nausea, vomiting and diarrhea.       SLP Plan  Continue with current plan of care     Recommendations  Diet recommendations: NPO Medication Administration: Via alternative means              Oral Care Recommendations: Oral care QID Follow up Recommendations:  (tba) Plan: Continue with current plan of care    Germain Osgood, M.A. CCC-SLP [phone]  Germain Osgood 06/14/2015, 11:22 AM

## 2015-06-15 ENCOUNTER — Inpatient Hospital Stay (HOSPITAL_COMMUNITY): Payer: Medicare Other

## 2015-06-15 DIAGNOSIS — N179 Acute kidney failure, unspecified: Secondary | ICD-10-CM | POA: Diagnosis present

## 2015-06-15 DIAGNOSIS — N19 Unspecified kidney failure: Secondary | ICD-10-CM

## 2015-06-15 LAB — CBC WITH DIFFERENTIAL/PLATELET
BASOS ABS: 0 10*3/uL (ref 0.0–0.1)
BASOS PCT: 0 %
Eosinophils Absolute: 0 10*3/uL (ref 0.0–0.7)
Eosinophils Relative: 0 %
HEMATOCRIT: 30 % — AB (ref 36.0–46.0)
HEMOGLOBIN: 9.7 g/dL — AB (ref 12.0–15.0)
Lymphocytes Relative: 7 %
Lymphs Abs: 0.6 10*3/uL — ABNORMAL LOW (ref 0.7–4.0)
MCH: 26.3 pg (ref 26.0–34.0)
MCHC: 32.3 g/dL (ref 30.0–36.0)
MCV: 81.3 fL (ref 78.0–100.0)
MONOS PCT: 4 %
Monocytes Absolute: 0.4 10*3/uL (ref 0.1–1.0)
NEUTROS ABS: 7.3 10*3/uL (ref 1.7–7.7)
NEUTROS PCT: 88 %
Platelets: 140 10*3/uL — ABNORMAL LOW (ref 150–400)
RBC: 3.69 MIL/uL — AB (ref 3.87–5.11)
RDW: 17.2 % — ABNORMAL HIGH (ref 11.5–15.5)
WBC: 8.3 10*3/uL (ref 4.0–10.5)

## 2015-06-15 LAB — BASIC METABOLIC PANEL
Anion gap: 12 (ref 5–15)
Anion gap: 14 (ref 5–15)
Anion gap: 13 (ref 5–15)
BUN: 86 mg/dL — AB (ref 6–20)
BUN: 82 mg/dL — AB (ref 6–20)
BUN: 79 mg/dL — AB (ref 6–20)
CALCIUM: 8 mg/dL — AB (ref 8.9–10.3)
CALCIUM: 8 mg/dL — AB (ref 8.9–10.3)
CALCIUM: 8 mg/dL — AB (ref 8.9–10.3)
CO2: 16 mmol/L — ABNORMAL LOW (ref 22–32)
CO2: 17 mmol/L — ABNORMAL LOW (ref 22–32)
CO2: 18 mmol/L — ABNORMAL LOW (ref 22–32)
CREATININE: 3.68 mg/dL — AB (ref 0.44–1.00)
Chloride: 123 mmol/L — ABNORMAL HIGH (ref 101–111)
Chloride: 119 mmol/L — ABNORMAL HIGH (ref 101–111)
Chloride: 120 mmol/L — ABNORMAL HIGH (ref 101–111)
Creatinine, Ser: 4.37 mg/dL — ABNORMAL HIGH (ref 0.44–1.00)
Creatinine, Ser: 3.84 mg/dL — ABNORMAL HIGH (ref 0.44–1.00)
GFR calc Af Amer: 9 mL/min — ABNORMAL LOW (ref >60)
GFR calc Af Amer: 11 mL/min — ABNORMAL LOW (ref >60)
GFR calc Af Amer: 11 mL/min — ABNORMAL LOW (ref >60)
GFR, EST NON AFRICAN AMERICAN: 8 mL/min — AB (ref >60)
GFR, EST NON AFRICAN AMERICAN: 9 mL/min — AB (ref >60)
GFR, EST NON AFRICAN AMERICAN: 10 mL/min — AB (ref >60)
GLUCOSE: 176 mg/dL — AB (ref 65–99)
GLUCOSE: 142 mg/dL — AB (ref 65–99)
GLUCOSE: 124 mg/dL — AB (ref 65–99)
Potassium: 3.7 mmol/L (ref 3.5–5.1)
Potassium: 3.6 mmol/L (ref 3.5–5.1)
Potassium: 3.7 mmol/L (ref 3.5–5.1)
Sodium: 151 mmol/L — ABNORMAL HIGH (ref 135–145)
Sodium: 150 mmol/L — ABNORMAL HIGH (ref 135–145)
Sodium: 151 mmol/L — ABNORMAL HIGH (ref 135–145)

## 2015-06-15 LAB — COMPREHENSIVE METABOLIC PANEL
ALBUMIN: 2.4 g/dL — AB (ref 3.5–5.0)
ALK PHOS: 49 U/L (ref 38–126)
ALT: 23 U/L (ref 14–54)
ANION GAP: 15 (ref 5–15)
AST: 58 U/L — ABNORMAL HIGH (ref 15–41)
BUN: 84 mg/dL — ABNORMAL HIGH (ref 6–20)
CALCIUM: 7.9 mg/dL — AB (ref 8.9–10.3)
CHLORIDE: 124 mmol/L — AB (ref 101–111)
CO2: 16 mmol/L — AB (ref 22–32)
Creatinine, Ser: 4.57 mg/dL — ABNORMAL HIGH (ref 0.44–1.00)
GFR calc non Af Amer: 8 mL/min — ABNORMAL LOW (ref >60)
GFR, EST AFRICAN AMERICAN: 9 mL/min — AB (ref >60)
GLUCOSE: 121 mg/dL — AB (ref 65–99)
POTASSIUM: 4 mmol/L (ref 3.5–5.1)
SODIUM: 155 mmol/L — AB (ref 135–145)
TOTAL PROTEIN: 5.2 g/dL — AB (ref 6.5–8.1)
Total Bilirubin: 0.8 mg/dL (ref 0.3–1.2)

## 2015-06-15 LAB — FECAL LACTOFERRIN, QUANT: FECAL LACTOFERRIN: POSITIVE

## 2015-06-15 LAB — MAGNESIUM: MAGNESIUM: 2.6 mg/dL — AB (ref 1.7–2.4)

## 2015-06-15 MED ORDER — SODIUM BICARBONATE 8.4 % IV SOLN
INTRAVENOUS | Status: DC
  Administered 2015-06-15 – 2015-06-18 (×5): via INTRAVENOUS
  Filled 2015-06-15 (×13): qty 50

## 2015-06-15 MED ORDER — DEXTROSE 5 % IV SOLN
INTRAVENOUS | Status: DC
  Filled 2015-06-15 (×2): qty 100

## 2015-06-15 MED ORDER — DEXTROSE 5 % IV SOLN
INTRAVENOUS | Status: DC
  Administered 2015-06-15: 1000 mL via INTRAVENOUS

## 2015-06-15 NOTE — Progress Notes (Signed)
Admit: 06/12/2015 LOS: 3  44F with AKI (nl BL SCr) in setting of UTI, Hypovolemia, Chronic ACEi use, Diarrhea, and found to have inc AG Met Acidosis  Subjective:  Pt awakens to voice but doesn't meaningfully converse No family at bedside Renal US with small L kidney, b/l atrophy and cortical thinning with inc echogenicity; no obstruction or HN SNa up to 155 this AM Not able to measure UOP Low grade fevers overnight   12/30 0701 - 12/31 0700 In: 956.7 [I.V.:906.7; IV Piggyback:50] Out: 525 [Urine:525]  Filed Weights   06/13/15 0558 06/14/15 0500 06/15/15 0500  Weight: 56.5 kg (124 lb 9 oz) 53.7 kg (118 lb 6.2 oz) 52.5 kg (115 lb 11.9 oz)    Scheduled Meds: . antiseptic oral rinse  7 mL Mouth Rinse q12n4p  . antiseptic oral rinse  7 mL Mouth Rinse q12n4p  . aspirin EC  81 mg Oral Daily  . cefTRIAXone (ROCEPHIN)  IV  1 g Intravenous Q24H  . chlorhexidine  15 mL Mouth Rinse BID  . chlorhexidine  15 mL Mouth Rinse BID  . heparin  5,000 Units Subcutaneous 3 times per day  . metoprolol  2.5 mg Intravenous 4 times per day  . pravastatin  20 mg Oral QHS   Continuous Infusions: . dextrose 1,000 mL (06/15/15 0654)   PRN Meds:.sodium chloride, acetaminophen, ondansetron (ZOFRAN) IV  Current Labs: reviewed    Physical Exam:  Blood pressure 143/81, pulse 102, temperature 100.5 F (38.1 C), temperature source Oral, resp. rate 30, height 5' (1.524 m), weight 52.5 kg (115 lb 11.9 oz), SpO2 99 %. GEN: somnolent, doesn't participaite in exam ENT: NCAT, poor dentition EYES: EOMI CV: tachy, irregular, no rub PULM: coars bs/ bl ABD: s/nt/nd SKIN: no rashes/lesions EXT:no edema GU: no foley  A 1. AKI, nl BL SCr, presumed 2/2 hypovolemia + ACEi + UTI, now likley ATN; not obstructed and some L renal atrophy > R 2. Inc AG Met Acidosis likely 2/2 starvation ketoacidosis + Lactate + diarrhea + AKI 3. Hypernatremia 4. Proteus UTI on Ceftriaxone 5. Acute  Diarrhea 6. AMS 7. HTN  P 1. Change IVFs to D5W + 1 Amp NaHCO3 to give more free water 2. BID labs to follow Na 3. Grim outlook in this 51y woman; 4. No need for RRT  Daily weights, Daily Renal Panel, Strict I/Os, Avoid nephrotoxins (NSAIDs, judicious IV Contrast)    Pearson Grippe MD 06/15/2015, 9:30 AM   Recent Labs Lab 06/14/15 0449 06/14/15 0815 06/15/15 0432  NA QUANTITY NOT SUFFICIENT, UNABLE TO PERFORM TEST 148* 155*  K QUANTITY NOT SUFFICIENT, UNABLE TO PERFORM TEST 4.8 4.0  CL QUANTITY NOT SUFFICIENT, UNABLE TO PERFORM TEST 118* 124*  CO2 QUANTITY NOT SUFFICIENT, UNABLE TO PERFORM TEST 12* 16*  GLUCOSE 77 79 121*  BUN QUANTITY NOT SUFFICIENT, UNABLE TO PERFORM TEST 74* 84*  CREATININE 4.89* 4.84* 4.57*  CALCIUM QUANTITY NOT SUFFICIENT, UNABLE TO PERFORM TEST 7.4* 7.9*    Recent Labs Lab 06/14/15 0449 06/14/15 0815 06/15/15 0432  WBC 7.0 6.8 8.3  NEUTROABS 6.3 6.0 7.3  HGB 11.5* 10.1* 9.7*  HCT 35.2* 32.2* 30.0*  MCV 81.7 82.8 81.3  PLT 105* 137* 140*

## 2015-06-15 NOTE — Progress Notes (Signed)
*  PRELIMINARY RESULTS* Vascular Ultrasound Renal Artery Duplex has been completed.   Study was very technically difficult due to patient body habitus and patient's combative state and inability to cooperate. Limited evaluation of the right renal artery exhibits no obvious evidence of hemodynamically significant right renal artery stenosis, however unable to insonate most segments. Unable to evaluate the left kidney due to patient's heightened agitated state and inability to cooperate.  06/15/2015 12:04 PM Maudry Mayhew, RVT, RDCS, RDMS

## 2015-06-15 NOTE — Progress Notes (Signed)
Speech Language Pathology Treatment: Dysphagia  Patient Details Name: NANDA CZAP MRN: [id number] DOB: Aug 24, 1920 Today's Date: 06/15/2015 Time: [id number] SLP Time Calculation (min) (ACUTE ONLY): 23 min  Assessment / Plan / Recommendation Clinical Impression  Diagnostic treatment complete at bedside including po trials. Patient with continued improvements in levels of alertness however continues to require max cueing for sustained attention to task and intellectual awareness of surroundings. With mod-max cueing however, patient able to consume thin liquids without overt indication of aspiration and with timely oral transit of bolus, no anterior labial spillage as noted during previous sessions. Contextual cues and hand over hand assist with self feeding aided in increased awareness of bolus. Oral phase continues to be prolonged with excessive mastication of solids bolus, requiring max assist for oral transit of bolus which exceeded 1-2 minutes at times. Recommend initiation of clear liquids diet to maximize po intake, minimizing risk of dehydration and malnutrition. Prognosis for ability to resume solids good with continued improvements in mentation. Will continue to f/u closely.    HPI HPI: 81 yo Female with PMH significant for CKD, iron deficiency anemia and HTN who lives under the care of her nephew who presented to ED on 06/12/2015 with abdominal pain, nausea, vomiting and diarrhea.       SLP Plan  Goals updated     Recommendations  Diet recommendations: Thin liquid (clear liquids) Liquids provided via: Cup;Straw Medication Administration: Crushed with puree Supervision: Staff to assist with self feeding;Full supervision/cueing for compensatory strategies Compensations: Slow rate;Small sips/bites;Other (Comment) (HOH assist with feeding to facilitate awareness) Postural Changes and/or Swallow Maneuvers: Seated upright 90 degrees              Oral Care Recommendations: Oral  care BID Follow up Recommendations: None Plan: Goals updated  Gabriel Rainwater [place], [place] [phone]  Dylan Ruotolo Meryl 06/15/2015, 8:58 AM

## 2015-06-15 NOTE — Progress Notes (Signed)
Cortrak RE-placement as patient pulled first tube minutes after placement. Again, unable to reach SB. Gastric Access thought to be achieved. Abdominal xray ordered.  Pt now restrained. If tube pulled again and emergent access is needed before Monday. IR or RN can place NG.   Burtis Junes RD, LDN Nutrition Pager: [phone] 06/15/2015 5:31 PM

## 2015-06-15 NOTE — Progress Notes (Signed)
TEAM 1 - Stepdown/ICU TEAM Progress Note  GOLDA ZAVALZA [id number] DOB: [birth year] DOA: 06/12/2015 PCP: Vic Blackbird, MD  Admit HPI / Brief Narrative: 81 year old BF PMHx CKD stage I, Fe deficiency Anemia, HTN, HLD, Diverticulosis of colon,Herpes Simplex.  Lives under the care of her nephew who presented to the [facility] emergency department on 06/12/2015 complaining of abdominal pain. Her nephew provides the history as the patient was encephalopathic. He states that one day prior to admission she was in her usual health feeling well and they ate out the night before. Overnight she had no significant difficulty but early in the morning on 06/12/2015 she started had significant nausea, vomiting, diarrhea, and cramping abdominal pain. Because this persisted and because of the severity of her symptoms he brought her to the emergency department. In the emergency department she was found to be tachycardic, hypotensive, had a lactic acid greater than 10, and to be in acute renal failure. She was given IV fluids, antibiotics, a CT scan of her abdomen was performed and pulmonary and critical care medicine was consulted for further evaluation. After receiving IV fluids and antibiotics she felt significant better  HPI/Subjective: 12/31 T-max overnight= 38.2, initially hypothermic on admission. Patient much more alert today. A/O 1 (does not know where, when, why). Patient pulled out NG tube. Follow some commands.   Assessment/Plan: Sepsis unknown organism -Urine positive Proteus Mirabilis; Acute on Chronic UTI ; infectious diarrhea? -Blood cultures pending, GI pathogen panel pending -Continue current antibiotic -Lactic acidosis resolving -On admission patient met criteria for sepsis -Worsening hypernatremia on the D5LR D/C. Start D5W 167m/hr +1 Amp NaHCO3 ;  -Attempting to obtain Cortrack NG tube, which will allow free water as well as nutrition -Strict in and out; since  admission + 6.2 L  -Daily weight           12/31 bed weight= 52.5 kg - Aspiration of vomitus? -12/31 past swallow study; clear liquid diet. -PCXR; new left pleural effusion see results below    Anion gap acute metabolic acidosis -likely 2/2 starvation ketoacidosis + Lactate + diarrhea + AKI  Atrial fibrillation unspecified type  -Metoprolol IV 2.5 mg QID will titrate up slowly on this octogenarian if required -TSH WNL  Hypertension  -See atrial fibrillation   Acute renal failure  -Most likely secondary to volume depletion -See sepsis -12/30 worsening renal failure obtain renal ultrasound, consult Nephrology -See metabolic acidosis   Diarrhea -Possibly secondary to Acute viral gastroenteritis with proctitis, infectious diarrhea?, ischemic bowel R/Oed by abdominal CT  -GI panel by PCR negative -Fecal lactoferrin positive  Hyperkalemia -Resolved with Kayexalate treatment   Hypernatremia -See sepsis  Acute encephalopathy, -Minimize sedating medications  Goals of care -Consult palliative care -Patient stands a good chance of not surviving this hospitalization need to address short-term vs long-term goals of care. -In this patient's case believe some formal hospice would be recommended course of action. -Unsure of who has official HCPOA, but believed patient has a state appointed guardian.     Code Status: DO NOT RESUSCITATE  Family Communication:  family present at time of exam Disposition Plan: Resolution altered mental status    Consultants: Dr.Douglas BJerral [facility]rology   Procedure/Significant Events: 06/12/2015 CT scan of the abdomen: Evidence of proctocolitis, negative SBO/peritoneal fluid/perforation. 12/30 PCXR; new moderate left pleural effusion. -Chronic posterior right diaphragmatic hernia containing a portion of liver, right kidney and right adrenal gland.    Culture 12/28 Blood culture left/right  arm NGTD 12/28 Urine  positive Proteus Mirabilis  12/29 GI panel negative 12/31 C. difficile by PCR pending   Antibiotics: 12/28 Zosyn>> 12/30 12/30 ceftriaxone>>   DVT prophylaxis: Subcutaneous heparin   Devices    LINES / TUBES:      Continuous Infusions: .  sodium bicarbonate  infusion 1000 mL 100 mL/hr at 06/15/15 1105    Objective: VITAL SIGNS: Temp: 100 F (37.8 C) (12/31 2020) Temp Source: Oral (12/31 2020) BP: 117/93 mmHg (12/31 2020) Pulse Rate: 99 (12/31 2020) SPO2; FIO2:   Intake/Output Summary (Last 24 hours) at 06/15/15 2036 Last data filed at 06/15/15 1900  Gross per 24 hour  Intake 1631.66 ml  Output    425 ml  Net 1206.66 ml     Exam: General:  A/O 1 (does not know where, when, why), patient does attempt to enter questions. Follows most commands .No acute respiratory distress Eyes: Negative headache, negative scleral hemorrhage ENT: Negative Runny nose, negative gingival bleeding, Neck:  Negative scars, masses, torticollis, lymphadenopathy, JVD Lungs: Clear to auscultation bilaterally without wheezes or crackles Cardiovascular:  Irregular irregular rhythm and rate, negative murmur gallop or rub normal S1 and S2 Abdomen: Positive Abdominal pain to palpation , nondistended, positive soft, bowel sounds, no rebound, no ascites, no appreciable mass Extremities: No significant cyanosis, clubbing, or edema bilateral lower extremities Psychiatric:  Unable to evaluate secondary to patient's altered mental status Neurologic:  Patient will move all extremities to painful stimuli, will open eyes to painful stimuli. Unable to evaluate further secondary to altered mental status  Data Reviewed: Basic Metabolic Panel:  Recent Labs Lab 06/13/15 1350 06/14/15 0449 06/14/15 0815 06/15/15 0432 06/15/15 1200 06/15/15 1841  NA  --  QUANTITY NOT SUFFICIENT, UNABLE TO PERFORM TEST 148* 155* 151* 150*  K 5.9* QUANTITY NOT SUFFICIENT, UNABLE TO PERFORM TEST 4.8 4.0 3.7 3.6  CL   --  QUANTITY NOT SUFFICIENT, UNABLE TO PERFORM TEST 118* 124* 123* 119*  CO2  --  QUANTITY NOT SUFFICIENT, UNABLE TO PERFORM TEST 12* 16* 16* 17*  GLUCOSE  --  77 79 121* 176* 142*  BUN  --  QUANTITY NOT SUFFICIENT, UNABLE TO PERFORM TEST 74* 84* 86* 82*  CREATININE  --  4.89* 4.84* 4.57* 4.37* 3.84*  CALCIUM  --  QUANTITY NOT SUFFICIENT, UNABLE TO PERFORM TEST 7.4* 7.9* 8.0* 8.0*  MG 2.5* 2.5*  --  2.6*  --   --    Liver Function Tests:  Recent Labs Lab 06/12/15 1612 06/14/15 0449 06/14/15 0815 06/15/15 0432  AST 48* 62* 58* 58*  ALT 19 23 21 23   ALKPHOS 84 50 49 49  BILITOT 0.9 1.0 1.0 0.8  PROT 6.9 5.8* 5.5* 5.2*  ALBUMIN 3.7 2.7* 2.4* 2.4*   No results for input(s): LIPASE, AMYLASE in the last 168 hours. No results for input(s): AMMONIA in the last 168 hours. CBC:  Recent Labs Lab 06/12/15 1612  06/12/15 2055 06/13/15 0004 06/14/15 0449 06/14/15 0815 06/15/15 0432  WBC 10.7*  --  8.1 8.1 7.0 6.8 8.3  NEUTROABS 9.1*  --   --   --  6.3 6.0 7.3  HGB 13.4  < > 11.6* 11.5* 11.5* 10.1* 9.7*  HCT 41.6  < > 38.1 37.3 35.2* 32.2* 30.0*  MCV 84.9  --  85.2 84.4 81.7 82.8 81.3  PLT 200  --  171 163 105* 137* 140*  < > = values in this interval not displayed. Cardiac Enzymes: No results for input(s): CKTOTAL, CKMB,  CKMBINDEX, TROPONINI in the last 168 hours. BNP (last 3 results) No results for input(s): BNP in the last 8760 hours.  ProBNP (last 3 results) No results for input(s): PROBNP in the last 8760 hours.  CBG:  Recent Labs Lab 06/12/15 1612  GLUCAP 127*    Recent Results (from the past 240 hour(s))  Culture, blood (routine x 2)     Status: None (Preliminary result)   Collection Time: 06/12/15  4:05 PM  Result Value Ref Range Status   Specimen Description BLOOD LEFT ARM  Final   Special Requests IN PEDIATRIC BOTTLE 2CC  Final   Culture NO GROWTH 3 DAYS  Final   Report Status PENDING  Incomplete  Culture, blood (routine x 2)     Status: None (Preliminary  result)   Collection Time: 06/12/15  4:12 PM  Result Value Ref Range Status   Specimen Description BLOOD RIGHT ARM  Final   Special Requests IN PEDIATRIC BOTTLE 2CC  Final   Culture NO GROWTH 3 DAYS  Final   Report Status PENDING  Incomplete  Urine culture     Status: None   Collection Time: 06/12/15  5:59 PM  Result Value Ref Range Status   Specimen Description URINE, CATHETERIZED  Final   Special Requests NONE  Final   Culture >=100,000 COLONIES/mL PROTEUS MIRABILIS  Final   Report Status 06/14/2015 FINAL  Final   Organism ID, Bacteria PROTEUS MIRABILIS  Final      Susceptibility   Proteus mirabilis - MIC*    AMPICILLIN <=2 SENSITIVE Sensitive     CEFAZOLIN <=4 SENSITIVE Sensitive     CEFTRIAXONE <=1 SENSITIVE Sensitive     CIPROFLOXACIN <=0.25 SENSITIVE Sensitive     GENTAMICIN <=1 SENSITIVE Sensitive     IMIPENEM <=0.25 SENSITIVE Sensitive     NITROFURANTOIN 256 RESISTANT Resistant     TRIMETH/SULFA <=20 SENSITIVE Sensitive     AMPICILLIN/SULBACTAM <=2 SENSITIVE Sensitive     * >=100,000 COLONIES/mL PROTEUS MIRABILIS  MRSA PCR Screening     Status: None   Collection Time: 06/12/15  7:16 PM  Result Value Ref Range Status   MRSA by PCR NEGATIVE NEGATIVE Final    Comment:        The GeneXpert MRSA Assay (FDA approved for NASAL specimens only), is one component of a comprehensive MRSA colonization surveillance program. It is not intended to diagnose MRSA infection nor to guide or monitor treatment for MRSA infections.   Gastrointestinal Panel by PCR , Stool     Status: None   Collection Time: 06/13/15  3:32 PM  Result Value Ref Range Status   Campylobacter species NOT DETECTED NOT DETECTED Final   Plesimonas shigelloides NOT DETECTED NOT DETECTED Final   Salmonella species NOT DETECTED NOT DETECTED Final   Yersinia enterocolitica NOT DETECTED NOT DETECTED Final   Vibrio species NOT DETECTED NOT DETECTED Final   Vibrio cholerae NOT DETECTED NOT DETECTED Final    Enteroaggregative E coli (EAEC) NOT DETECTED NOT DETECTED Final   Enteropathogenic E coli (EPEC) NOT DETECTED NOT DETECTED Final   Enterotoxigenic E coli (ETEC) NOT DETECTED NOT DETECTED Final   Shiga like toxin producing E coli (STEC) NOT DETECTED NOT DETECTED Final   E. coli O157 NOT DETECTED NOT DETECTED Final   Shigella/Enteroinvasive E coli (EIEC) NOT DETECTED NOT DETECTED Final   Cryptosporidium NOT DETECTED NOT DETECTED Final   Cyclospora cayetanensis NOT DETECTED NOT DETECTED Final   Entamoeba histolytica NOT DETECTED NOT DETECTED Final   Giardia  lamblia NOT DETECTED NOT DETECTED Final   Adenovirus F40/41 NOT DETECTED NOT DETECTED Final   Astrovirus NOT DETECTED NOT DETECTED Final   Norovirus GI/GII NOT DETECTED NOT DETECTED Final   Rotavirus A NOT DETECTED NOT DETECTED Final   Sapovirus (I, II, IV, and V) NOT DETECTED NOT DETECTED Final     Studies:  Recent x-ray studies have been reviewed in detail by the Attending Physician  Scheduled Meds:  Scheduled Meds: . antiseptic oral rinse  7 mL Mouth Rinse q12n4p  . aspirin EC  81 mg Oral Daily  . cefTRIAXone (ROCEPHIN)  IV  1 g Intravenous Q24H  . chlorhexidine  15 mL Mouth Rinse BID  . heparin  5,000 Units Subcutaneous 3 times per day  . metoprolol  2.5 mg Intravenous 4 times per day  . pravastatin  20 mg Oral QHS    Time spent on care of this patient: 40 mins   WOODS, Geraldo Docker , MD  [facility] Office  [phone] Pager [phone]  On-Call/Text Page:      [email or web address]      password [email or web address]  If 7PM-7AM, please contact night-coverage [email or web address] Password [email or web address] 06/15/2015, 8:36 PM   LOS: 3 days   Care during the described time interval was provided by me .  I have reviewed this patient's available data, including medical history, events of note, physical examination, and all test results as part of my evaluation. I have personally reviewed and interpreted all radiology studies.   Dia Crawford,  MD [phone] Pager

## 2015-06-15 NOTE — Progress Notes (Signed)
Cortrak placement difficult due to patient's AMS/combativeness. Unable to reach SB. Gastric access thought to be achieved. Tube at high risk for displacement. Abdominal xray ordered by RN.   Burtis Junes RD, LDN Nutrition Pager: [phone] 06/15/2015 3:56 PM

## 2015-06-16 DIAGNOSIS — A419 Sepsis, unspecified organism: Secondary | ICD-10-CM | POA: Insufficient documentation

## 2015-06-16 DIAGNOSIS — Z87898 Personal history of other specified conditions: Secondary | ICD-10-CM

## 2015-06-16 DIAGNOSIS — Z4659 Encounter for fitting and adjustment of other gastrointestinal appliance and device: Secondary | ICD-10-CM | POA: Insufficient documentation

## 2015-06-16 LAB — CBC WITH DIFFERENTIAL/PLATELET
Basophils Absolute: 0 10*3/uL (ref 0.0–0.1)
Basophils Relative: 0 %
EOS ABS: 0 10*3/uL (ref 0.0–0.7)
Eosinophils Relative: 1 %
HEMATOCRIT: 30.6 % — AB (ref 36.0–46.0)
HEMOGLOBIN: 9.9 g/dL — AB (ref 12.0–15.0)
LYMPHS ABS: 0.7 10*3/uL (ref 0.7–4.0)
LYMPHS PCT: 15 %
MCH: 26.2 pg (ref 26.0–34.0)
MCHC: 32.4 g/dL (ref 30.0–36.0)
MCV: 81 fL (ref 78.0–100.0)
MONOS PCT: 7 %
Monocytes Absolute: 0.4 10*3/uL (ref 0.1–1.0)
NEUTROS PCT: 77 %
Neutro Abs: 3.7 10*3/uL (ref 1.7–7.7)
Platelets: 112 10*3/uL — ABNORMAL LOW (ref 150–400)
RBC: 3.78 MIL/uL — ABNORMAL LOW (ref 3.87–5.11)
RDW: 17.2 % — ABNORMAL HIGH (ref 11.5–15.5)
WBC: 4.8 10*3/uL (ref 4.0–10.5)

## 2015-06-16 LAB — COMPREHENSIVE METABOLIC PANEL
ALT: 27 U/L (ref 14–54)
AST: 61 U/L — AB (ref 15–41)
Albumin: 2.2 g/dL — ABNORMAL LOW (ref 3.5–5.0)
Alkaline Phosphatase: 53 U/L (ref 38–126)
Anion gap: 11 (ref 5–15)
BUN: 76 mg/dL — ABNORMAL HIGH (ref 6–20)
CHLORIDE: 119 mmol/L — AB (ref 101–111)
CO2: 20 mmol/L — AB (ref 22–32)
CREATININE: 3.44 mg/dL — AB (ref 0.44–1.00)
Calcium: 7.9 mg/dL — ABNORMAL LOW (ref 8.9–10.3)
GFR calc non Af Amer: 11 mL/min — ABNORMAL LOW (ref >60)
GFR, EST AFRICAN AMERICAN: 12 mL/min — AB (ref >60)
Glucose, Bld: 123 mg/dL — ABNORMAL HIGH (ref 65–99)
POTASSIUM: 3.4 mmol/L — AB (ref 3.5–5.1)
SODIUM: 150 mmol/L — AB (ref 135–145)
Total Bilirubin: 0.5 mg/dL (ref 0.3–1.2)
Total Protein: 5.4 g/dL — ABNORMAL LOW (ref 6.5–8.1)

## 2015-06-16 LAB — BASIC METABOLIC PANEL
ANION GAP: 11 (ref 5–15)
BUN: 73 mg/dL — ABNORMAL HIGH (ref 6–20)
CHLORIDE: 119 mmol/L — AB (ref 101–111)
CO2: 17 mmol/L — ABNORMAL LOW (ref 22–32)
Calcium: 7.6 mg/dL — ABNORMAL LOW (ref 8.9–10.3)
Creatinine, Ser: 3.01 mg/dL — ABNORMAL HIGH (ref 0.44–1.00)
GFR, EST AFRICAN AMERICAN: 14 mL/min — AB (ref >60)
GFR, EST NON AFRICAN AMERICAN: 13 mL/min — AB (ref >60)
Glucose, Bld: 137 mg/dL — ABNORMAL HIGH (ref 65–99)
POTASSIUM: 3.8 mmol/L (ref 3.5–5.1)
SODIUM: 147 mmol/L — AB (ref 135–145)

## 2015-06-16 LAB — C DIFFICILE QUICK SCREEN W PCR REFLEX
C DIFFICILE (CDIFF) INTERP: NEGATIVE
C Diff antigen: NEGATIVE
C Diff toxin: NEGATIVE

## 2015-06-16 LAB — MAGNESIUM: MAGNESIUM: 2.1 mg/dL (ref 1.7–2.4)

## 2015-06-16 MED ORDER — FREE WATER
100.0000 mL | Status: DC
  Administered 2015-06-16 – 2015-06-17 (×8): 100 mL

## 2015-06-16 NOTE — Progress Notes (Signed)
Admit: 06/12/2015 LOS: 4  62F with AKI (nl BL SCr) in setting of UTI, Hypovolemia, Chronic ACEi use, Diarrhea, and found to have inc AG Met Acidosis  Subjective:  Pt more awake this AM Na improving and SCr downtrending Can tell me her name, otherwise disoriented  12/31 0701 - 01/01 0700 In: 1311.7 [P.O.:240; I.V.:1021.7; IV Piggyback:50] Out: 375 [Urine:375]  Filed Weights   06/14/15 0500 06/15/15 0500 06/16/15 0400  Weight: 53.7 kg (118 lb 6.2 oz) 52.5 kg (115 lb 11.9 oz) 56.1 kg (123 lb 10.9 oz)    Scheduled Meds: . antiseptic oral rinse  7 mL Mouth Rinse q12n4p  . aspirin EC  81 mg Oral Daily  . cefTRIAXone (ROCEPHIN)  IV  1 g Intravenous Q24H  . chlorhexidine  15 mL Mouth Rinse BID  . heparin  5,000 Units Subcutaneous 3 times per day  . metoprolol  2.5 mg Intravenous 4 times per day  . pravastatin  20 mg Oral QHS   Continuous Infusions: .  sodium bicarbonate  infusion 1000 mL 100 mL/hr at 06/15/15 2100   PRN Meds:.sodium chloride, acetaminophen, ondansetron (ZOFRAN) IV  Current Labs: reviewed    Physical Exam:  Blood pressure 117/93, pulse 99, temperature 100.6 F (38.1 C), temperature source Oral, resp. rate 21, height 5' (1.524 m), weight 56.1 kg (123 lb 10.9 oz), SpO2 95 %. GEN: somnolent, doesn't participaite in exam ENT: NCAT, poor dentition EYES: EOMI CV: tachy, irregular, no rub PULM: coars bs/ bl ABD: s/nt/nd SKIN: no rashes/lesions EXT:no edema GU: no foley  A 1. AKI, nl BL SCr, presumed 2/2 hypovolemia + ACEi + UTI, now likley ATN; not obstructed and some L renal atrophy > R DOUBT any meaningful RAS 2. Inc AG Met Acidosis likely 2/2 starvation ketoacidosis + Lactate + diarrhea + AKI improving with IVFs and D5 3. Hypernatremia improving 4. Proteus UTI on Ceftriaxone 5. Acute Diarrhea resolved 6. AMS / Delirium 7. HTN stable  P 1. Cont IVFs to D5W + 1 Amp NaHCO3 to give more free water 2. Agree with palliative eval with family 3. Grim outlook in  this 51y woman; 4. No need for RRT  Daily weights, Daily Renal Panel, Strict I/Os, Avoid nephrotoxins (NSAIDs, judicious IV Contrast)    Pearson Grippe MD 06/16/2015, 8:12 AM   Recent Labs Lab 06/15/15 1841 06/15/15 2219 06/16/15 0420  NA 150* 151* 150*  K 3.6 3.7 3.4*  CL 119* 120* 119*  CO2 17* 18* 20*  GLUCOSE 142* 124* 123*  BUN 82* 79* 76*  CREATININE 3.84* 3.68* 3.44*  CALCIUM 8.0* 8.0* 7.9*    Recent Labs Lab 06/14/15 0815 06/15/15 0432 06/16/15 0420  WBC 6.8 8.3 4.8  NEUTROABS 6.0 7.3 3.7  HGB 10.1* 9.7* 9.9*  HCT 32.2* 30.0* 30.6*  MCV 82.8 81.3 81.0  PLT 137* 140* 112*

## 2015-06-16 NOTE — Progress Notes (Signed)
Text page to DR Sherral Hammers about addressing  Feedings per Cortrack tube. Pt currently receiving Free water as ordered. Pt ataking in small amounts of PO clear liquids and jello

## 2015-06-16 NOTE — Progress Notes (Signed)
[place] TEAM 1 - Stepdown/ICU TEAM Progress Note  [name] [name] TKW:[id number] DOB: [birth year] DOA: 06/12/2015 PCP: Vic Blackbird, MD  Admit HPI / Brief Narrative: 82 year old BF PMHx CKD stage I, Fe deficiency Anemia, HTN, HLD, Diverticulosis of colon,Herpes Simplex.  Lives under the care of her nephew who presented to the [facility] emergency department on 06/12/2015 complaining of abdominal pain. Her nephew provides the history as the patient was encephalopathic. He states that one day prior to admission she was in her usual health feeling well and they ate out the night before. Overnight she had no significant difficulty but early in the morning on 06/12/2015 she started had significant nausea, vomiting, diarrhea, and cramping abdominal pain. Because this persisted and because of the severity of her symptoms he brought her to the emergency department. In the emergency department she was found to be tachycardic, hypotensive, had a lactic acid greater than 10, and to be in acute renal failure. She was given IV fluids, antibiotics, a CT scan of her abdomen was performed and pulmonary and critical care medicine was consulted for further evaluation. After receiving IV fluids and antibiotics she felt significant better  HPI/Subjective: 1/1 T-max overnight= 38.2, initially hypothermic on admission.   Patient much more alert today. A/O 1 (does not know where, when, why). Patient pulled out NG tube. Follow some commands.   Assessment/Plan: Sepsis unspecified organism -Urine positive Proteus Mirabilis; Acute on Chronic UTI ; infectious diarrhea? -Blood cultures pending, GI pathogen panel pending -Continue current antibiotic -Lactic acidosis resolving -On admission patient met criteria for sepsis -Cortrack NG tube, placed start free water 172m q 4hr  -Continue D5W-sodium bicarbonate at 1048mhr per nephrology -Strict in and out; since admission + 5.9 L  -Daily weight admission bed  weight= 58.3 kg           1/1 bed weight= 56.1 kg - Aspiration of vomitus? -12/31 past swallow study; clear liquid diet. -PCXR; new left pleural effusion see results below    Anion gap acute metabolic acidosis -likely 2/2 starvation ketoacidosis + Lactate + diarrhea + AKI -See sepsis  Atrial fibrillation unspecified type  -Metoprolol IV 2.5 mg QID will titrate up slowly on this octogenarian if required -TSH WNL  Hypertension  -See atrial fibrillation   Acute renal failure  -Most likely secondary to volume depletion -See sepsis -12/30 worsening renal failure obtain renal ultrasound, consulted Nephrology -See metabolic acidosis   Diarrhea -Possibly secondary to Acute viral gastroenteritis with proctitis, infectious diarrhea?, ischemic bowel R/Oed by abdominal CT  -GI panel by PCR negative -Fecal lactoferrin positive  Hyperkalemia -Resolved with Kayexalate treatment   Hypernatremia -See sepsis -1/1 Recheck BMP 1500  Acute encephalopathy, -Minimize sedating medications  Goals of care -Consult palliative care -Patient stands a good chance of not surviving this hospitalization need to address short-term vs long-term goals of care. -In this patient's case believe some formal hospice would be recommended course of action. -Unsure of who has official HCPOA, but believed patient has a state appointed guardian.  -Palliative care working to answer these questions before we discussed issues with family members    Code Status: DO NOT RESUSCITATE  Family Communication:  No family present at time of exam Disposition Plan: Resolution altered mental status    Consultants: Dr.[facility] B [facility]   Procedure/Significant Events: 06/12/2015 CT scan of the abdomen: Evidence of proctocolitis, negative SBO/peritoneal fluid/perforation. 12/30 PCXR; new moderate left pleural effusion. -Chronic posterior right diaphragmatic hernia containing a  portion  of liver, right kidney and right adrenal gland.    Culture 12/28 Blood culture left/right arm NGTD 12/28 Urine positive Proteus Mirabilis  12/29 GI panel negative 12/31 C. difficile by PCR pending   Antibiotics: 12/28 Zosyn>> 12/30 12/30 ceftriaxone>>   DVT prophylaxis: Subcutaneous heparin   Devices    LINES / TUBES:      Continuous Infusions: .  sodium bicarbonate  infusion 1000 mL 100 mL/hr at 06/15/15 2100    Objective: VITAL SIGNS: Temp: 100.6 F (38.1 C) (01/01 0400) Temp Source: Oral (01/01 0400) SPO2; FIO2:   Intake/Output Summary (Last 24 hours) at 06/16/15 7353 Last data filed at 06/16/15 2992  Gross per 24 hour  Intake 1311.66 ml  Output    375 ml  Net 936.66 ml     Exam: General:  A/O 1 (does not know where, when, why), patient does attempt to enter questions. Follows most commands .No acute respiratory distress Eyes: Negative headache, negative scleral hemorrhage ENT: Negative Runny nose, negative gingival bleeding, Neck:  Negative scars, masses, torticollis, lymphadenopathy, JVD Lungs: Clear to auscultation bilaterally without wheezes or crackles Cardiovascular:  Irregular irregular rhythm and rate, negative murmur gallop or rub normal S1 and S2 Abdomen: Positive Abdominal pain to palpation , nondistended, positive soft, bowel sounds, no rebound, no ascites, no appreciable mass Extremities: No significant cyanosis, clubbing, or edema bilateral lower extremities Psychiatric:  Unable to evaluate secondary to patient's altered mental status Neurologic:  Patient will move all extremities to painful stimuli, will open eyes to painful stimuli. Unable to evaluate further secondary to altered mental status  Data Reviewed: Basic Metabolic Panel:  Recent Labs Lab 06/13/15 1350 06/14/15 0449  06/15/15 0432 06/15/15 1200 06/15/15 1841 06/15/15 2219 06/16/15 0420  NA  --  QUANTITY NOT SUFFICIENT, UNABLE TO PERFORM TEST  < > 155* 151* 150*  151* 150*  K 5.9* QUANTITY NOT SUFFICIENT, UNABLE TO PERFORM TEST  < > 4.0 3.7 3.6 3.7 3.4*  CL  --  QUANTITY NOT SUFFICIENT, UNABLE TO PERFORM TEST  < > 124* 123* 119* 120* 119*  CO2  --  QUANTITY NOT SUFFICIENT, UNABLE TO PERFORM TEST  < > 16* 16* 17* 18* 20*  GLUCOSE  --  77  < > 121* 176* 142* 124* 123*  BUN  --  QUANTITY NOT SUFFICIENT, UNABLE TO PERFORM TEST  < > 84* 86* 82* 79* 76*  CREATININE  --  4.89*  < > 4.57* 4.37* 3.84* 3.68* 3.44*  CALCIUM  --  QUANTITY NOT SUFFICIENT, UNABLE TO PERFORM TEST  < > 7.9* 8.0* 8.0* 8.0* 7.9*  MG 2.5* 2.5*  --  2.6*  --   --   --  2.1  < > = values in this interval not displayed. Liver Function Tests:  Recent Labs Lab 06/12/15 1612 06/14/15 0449 06/14/15 0815 06/15/15 0432 06/16/15 0420  AST 48* 62* 58* 58* 61*  ALT 19 23 21 23 27   ALKPHOS 84 50 49 49 53  BILITOT 0.9 1.0 1.0 0.8 0.5  PROT 6.9 5.8* 5.5* 5.2* 5.4*  ALBUMIN 3.7 2.7* 2.4* 2.4* 2.2*   No results for input(s): LIPASE, AMYLASE in the last 168 hours. No results for input(s): AMMONIA in the last 168 hours. CBC:  Recent Labs Lab 06/12/15 1612  06/13/15 0004 06/14/15 0449 06/14/15 0815 06/15/15 0432 06/16/15 0420  WBC 10.7*  < > 8.1 7.0 6.8 8.3 4.8  NEUTROABS 9.1*  --   --  6.3 6.0 7.3 3.7  HGB  13.4  < > 11.5* 11.5* 10.1* 9.7* 9.9*  HCT 41.6  < > 37.3 35.2* 32.2* 30.0* 30.6*  MCV 84.9  < > 84.4 81.7 82.8 81.3 81.0  PLT 200  < > 163 105* 137* 140* 112*  < > = values in this interval not displayed. Cardiac Enzymes: No results for input(s): CKTOTAL, CKMB, CKMBINDEX, TROPONINI in the last 168 hours. BNP (last 3 results) No results for input(s): BNP in the last 8760 hours.  ProBNP (last 3 results) No results for input(s): PROBNP in the last 8760 hours.  CBG:  Recent Labs Lab 06/12/15 1612  GLUCAP 127*    Recent Results (from the past 240 hour(s))  Culture, blood (routine x 2)     Status: None (Preliminary result)   Collection Time: 06/12/15  4:05 PM  Result  Value Ref Range Status   Specimen Description BLOOD LEFT ARM  Final   Special Requests IN PEDIATRIC BOTTLE 2CC  Final   Culture NO GROWTH 3 DAYS  Final   Report Status PENDING  Incomplete  Culture, blood (routine x 2)     Status: None (Preliminary result)   Collection Time: 06/12/15  4:12 PM  Result Value Ref Range Status   Specimen Description BLOOD RIGHT ARM  Final   Special Requests IN PEDIATRIC BOTTLE 2CC  Final   Culture NO GROWTH 3 DAYS  Final   Report Status PENDING  Incomplete  Urine culture     Status: None   Collection Time: 06/12/15  5:59 PM  Result Value Ref Range Status   Specimen Description URINE, CATHETERIZED  Final   Special Requests NONE  Final   Culture >=100,000 COLONIES/mL PROTEUS MIRABILIS  Final   Report Status 06/14/2015 FINAL  Final   Organism ID, Bacteria PROTEUS MIRABILIS  Final      Susceptibility   Proteus mirabilis - MIC*    AMPICILLIN <=2 SENSITIVE Sensitive     CEFAZOLIN <=4 SENSITIVE Sensitive     CEFTRIAXONE <=1 SENSITIVE Sensitive     CIPROFLOXACIN <=0.25 SENSITIVE Sensitive     GENTAMICIN <=1 SENSITIVE Sensitive     IMIPENEM <=0.25 SENSITIVE Sensitive     NITROFURANTOIN 256 RESISTANT Resistant     TRIMETH/SULFA <=20 SENSITIVE Sensitive     AMPICILLIN/SULBACTAM <=2 SENSITIVE Sensitive     * >=100,000 COLONIES/mL PROTEUS MIRABILIS  MRSA PCR Screening     Status: None   Collection Time: 06/12/15  7:16 PM  Result Value Ref Range Status   MRSA by PCR NEGATIVE NEGATIVE Final    Comment:        The GeneXpert MRSA Assay (FDA approved for NASAL specimens only), is one component of a comprehensive MRSA colonization surveillance program. It is not intended to diagnose MRSA infection nor to guide or monitor treatment for MRSA infections.   Gastrointestinal Panel by PCR , Stool     Status: None   Collection Time: 06/13/15  3:32 PM  Result Value Ref Range Status   Campylobacter species NOT DETECTED NOT DETECTED Final   Plesimonas shigelloides  NOT DETECTED NOT DETECTED Final   Salmonella species NOT DETECTED NOT DETECTED Final   Yersinia enterocolitica NOT DETECTED NOT DETECTED Final   Vibrio species NOT DETECTED NOT DETECTED Final   Vibrio cholerae NOT DETECTED NOT DETECTED Final   Enteroaggregative E coli (EAEC) NOT DETECTED NOT DETECTED Final   Enteropathogenic E coli (EPEC) NOT DETECTED NOT DETECTED Final   Enterotoxigenic E coli (ETEC) NOT DETECTED NOT DETECTED Final   Shiga like  toxin producing E coli (STEC) NOT DETECTED NOT DETECTED Final   E. coli O157 NOT DETECTED NOT DETECTED Final   Shigella/Enteroinvasive E coli (EIEC) NOT DETECTED NOT DETECTED Final   Cryptosporidium NOT DETECTED NOT DETECTED Final   Cyclospora cayetanensis NOT DETECTED NOT DETECTED Final   Entamoeba histolytica NOT DETECTED NOT DETECTED Final   Giardia lamblia NOT DETECTED NOT DETECTED Final   Adenovirus F40/41 NOT DETECTED NOT DETECTED Final   Astrovirus NOT DETECTED NOT DETECTED Final   Norovirus GI/GII NOT DETECTED NOT DETECTED Final   Rotavirus A NOT DETECTED NOT DETECTED Final   Sapovirus (I, II, IV, and V) NOT DETECTED NOT DETECTED Final     Studies:  Recent x-ray studies have been reviewed in detail by the Attending Physician  Scheduled Meds:  Scheduled Meds: . antiseptic oral rinse  7 mL Mouth Rinse q12n4p  . aspirin EC  81 mg Oral Daily  . cefTRIAXone (ROCEPHIN)  IV  1 g Intravenous Q24H  . chlorhexidine  15 mL Mouth Rinse BID  . heparin  5,000 Units Subcutaneous 3 times per day  . metoprolol  2.5 mg Intravenous 4 times per day  . pravastatin  20 mg Oral QHS    Time spent on care of this patient: 40 mins   Lalena Salas, Geraldo Docker , MD  [facility] Office  [phone] Pager [phone]  On-Call/Text Page:      [email or web address]      password [email or web address]  If 7PM-7AM, please contact night-coverage [email or web address] Password [email or web address] 06/16/2015, 8:39 AM   LOS: 4 days   Care during the described time interval was provided by me .  I  have reviewed this patient's available data, including medical history, events of note, physical examination, and all test results as part of my evaluation. I have personally reviewed and interpreted all radiology studies.   Dia Crawford, MD [phone] Pager

## 2015-06-17 DIAGNOSIS — N171 Acute kidney failure with acute cortical necrosis: Secondary | ICD-10-CM

## 2015-06-17 DIAGNOSIS — I481 Persistent atrial fibrillation: Secondary | ICD-10-CM

## 2015-06-17 DIAGNOSIS — Z515 Encounter for palliative care: Secondary | ICD-10-CM | POA: Insufficient documentation

## 2015-06-17 DIAGNOSIS — Z7189 Other specified counseling: Secondary | ICD-10-CM | POA: Insufficient documentation

## 2015-06-17 LAB — CBC WITH DIFFERENTIAL/PLATELET
BASOS ABS: 0.1 10*3/uL (ref 0.0–0.1)
Basophils Relative: 1 %
Eosinophils Absolute: 0 10*3/uL (ref 0.0–0.7)
Eosinophils Relative: 0 %
HEMATOCRIT: 31.1 % — AB (ref 36.0–46.0)
HEMOGLOBIN: 10 g/dL — AB (ref 12.0–15.0)
LYMPHS ABS: 0.9 10*3/uL (ref 0.7–4.0)
LYMPHS PCT: 17 %
MCH: 26.5 pg (ref 26.0–34.0)
MCHC: 32.2 g/dL (ref 30.0–36.0)
MCV: 82.3 fL (ref 78.0–100.0)
MONOS PCT: 13 %
Monocytes Absolute: 0.7 10*3/uL (ref 0.1–1.0)
Neutro Abs: 3.4 10*3/uL (ref 1.7–7.7)
Neutrophils Relative %: 69 %
Platelets: 86 10*3/uL — ABNORMAL LOW (ref 150–400)
RBC: 3.78 MIL/uL — AB (ref 3.87–5.11)
RDW: 17 % — AB (ref 11.5–15.5)
WBC: 5.1 10*3/uL (ref 4.0–10.5)

## 2015-06-17 LAB — COMPREHENSIVE METABOLIC PANEL
ALT: 24 U/L (ref 14–54)
AST: 49 U/L — ABNORMAL HIGH (ref 15–41)
Albumin: 2.1 g/dL — ABNORMAL LOW (ref 3.5–5.0)
Alkaline Phosphatase: 49 U/L (ref 38–126)
Anion gap: 11 (ref 5–15)
BILIRUBIN TOTAL: 0.6 mg/dL (ref 0.3–1.2)
BUN: 57 mg/dL — ABNORMAL HIGH (ref 6–20)
CALCIUM: 7.5 mg/dL — AB (ref 8.9–10.3)
CHLORIDE: 112 mmol/L — AB (ref 101–111)
CO2: 22 mmol/L (ref 22–32)
Creatinine, Ser: 2.32 mg/dL — ABNORMAL HIGH (ref 0.44–1.00)
GFR, EST AFRICAN AMERICAN: 20 mL/min — AB (ref >60)
GFR, EST NON AFRICAN AMERICAN: 17 mL/min — AB (ref >60)
GLUCOSE: 122 mg/dL — AB (ref 65–99)
POTASSIUM: 3.1 mmol/L — AB (ref 3.5–5.1)
Sodium: 145 mmol/L (ref 135–145)
TOTAL PROTEIN: 5.1 g/dL — AB (ref 6.5–8.1)

## 2015-06-17 LAB — CULTURE, BLOOD (ROUTINE X 2)
Culture: NO GROWTH
Culture: NO GROWTH

## 2015-06-17 LAB — MAGNESIUM: MAGNESIUM: 1.8 mg/dL (ref 1.7–2.4)

## 2015-06-17 MED ORDER — FREE WATER
200.0000 mL | Status: DC
  Administered 2015-06-17 – 2015-06-19 (×11): 200 mL

## 2015-06-17 MED ORDER — POTASSIUM CHLORIDE 20 MEQ/15ML (10%) PO SOLN
40.0000 meq | Freq: Once | ORAL | Status: AC
  Administered 2015-06-17: 40 meq
  Filled 2015-06-17: qty 30

## 2015-06-17 MED ORDER — METOPROLOL TARTRATE 25 MG/10 ML ORAL SUSPENSION
12.5000 mg | Freq: Two times a day (BID) | ORAL | Status: DC
  Administered 2015-06-17 – 2015-06-23 (×11): 12.5 mg
  Filled 2015-06-17 (×3): qty 10
  Filled 2015-06-17 (×10): qty 5
  Filled 2015-06-17: qty 10
  Filled 2015-06-17: qty 5

## 2015-06-17 MED ORDER — JEVITY 1.2 CAL PO LIQD
1000.0000 mL | ORAL | Status: DC
  Administered 2015-06-17: 1000 mL
  Filled 2015-06-17 (×2): qty 1000
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
## 2015-06-17 NOTE — Progress Notes (Signed)
[facility] Rounding Note  Pt more awake and nephew seems very pleased that she is more alert.  Answers questions.  "Thirsty" UOP picking up.  01/01 0701 - 01/02 0700 In: 2725 [P.O.:75; I.V.:2100; NG/GT:500; IV Piggyback:50] Out: 1175 [[id number]]  Filed Weights   06/15/15 0500 06/16/15 0400 06/17/15 0500  Weight: 52.5 kg (115 lb 11.9 oz) 56.1 kg (123 lb 10.9 oz) 58 kg (127 lb 13.9 oz)    Physical Exam:  Blood pressure 138/77, pulse 106, temperature 98.3 F (36.8 C), temperature source Axillary, resp. rate 21, height 5' (1.524 m), weight 58 kg (127 lb 13.9 oz), SpO2 96 %. GEN: Sleepy but awakens, answers questions and follows commands CV: tachy, irregularly irregularly, no rub PULM: Anteriorly clear ABD: No focal tenderness. + BS EXT:no edema. SCD's in place GU: no foley  Scheduled Meds: . antiseptic oral rinse  7 mL Mouth Rinse q12n4p  . aspirin EC  81 mg Oral Daily  . cefTRIAXone (ROCEPHIN)  IV  1 g Intravenous Q24H  . chlorhexidine  15 mL Mouth Rinse BID  . free water  100 mL Per Tube Q4H  . heparin  5,000 Units Subcutaneous 3 times per day  . metoprolol  2.5 mg Intravenous 4 times per day  . pravastatin  20 mg Oral QHS   Continuous Infusions: .  sodium bicarbonate  infusion 1000 mL 100 mL/hr at 06/17/15 0607   PRN Meds:.sodium chloride, acetaminophen, ondansetron (ZOFRAN) IV  Current Labs: reviewed   Recent Labs Lab 06/16/15 0420 06/16/15 1100 06/17/15 0238  NA 150* 147* 145  K 3.4* 3.8 3.1*  CL 119* 119* 112*  CO2 20* 17* 22  GLUCOSE 123* 137* 122*  BUN 76* 73* 57*  CREATININE 3.44* 3.01* 2.32*  CALCIUM 7.9* 7.6* 7.5*    Recent Labs Lab 06/15/15 0432 06/16/15 0420 06/17/15 0238  WBC 8.3 4.8 5.1  NEUTROABS 7.3 3.7 3.4  HGB 9.7* 9.9* 10.0*  HCT 30.0* 30.6* 31.1*  MCV 81.3 81.0 82.3  PLT 140* 112* 86*      Background: 14F with AKI (nl BL SCr 1.02 03/2015) in setting of UTI, Hypovolemia, Chronic ACEi use, Diarrhea, and found to  have inc AG Met Acidosis  Assessment/Recommendations  1. AKI presumed 2/2 hypovolemia + ACEi + UTI, now likley ATN; not obstructed and some L renal atrophy > R DOUBT any meaningful RAS. Creatinine peaked at 4.57 and is now steadily falling - down to 2.3 today with improving UOP over past 24 hours and renal function continues to improve with IV hydration, treatment of infection.   2. AG Met Acidosis likely 2/2 starvation ketoacidosis + Lactate + diarrhea + AKI. Acidosis is improving. No gap now.  3. Hypernatremia improving with free water administration. Continue water via tube. Reduced hypotonic bicarb to 75/hour 4. Hypokalemia - KCL via tube - written for 40 once 5. Proteus UTI on Ceftriaxone 6. Acute Diarrhea - nurses state still w/some loose stool 7. AMS / Delirium - more alert/talking 8. HTN stable 9. AF - BB 10. Encephalopathy - slow improvement  At this point, would start reducing IVF rate (I dropped to 75/hour), Rx K (40 ordered per tube), continue to follow renal function. Expect further recovery. Renal will sign off, but if questions or can provide further help please call.  Jamal Maes, MD [facility] [phone] Pager 06/17/2015, 7:54 AM

## 2015-06-17 NOTE — Consult Note (Signed)
Consultation Note Date: 06/17/2015   Patient Name: [name] [name]  DOB: 1920/10/21  MRN: [id number]  Age / Sex: 82 y.o., female  PCP: Alycia Rossetti, MD Referring Physician: Cherene Altes, MD  Reason for Consultation: Establishing goals of care and Psychosocial/spiritual support    Clinical Assessment/Narrative: Patient seen with Dr. Rowe Pavy.  Frail 82 yo female who is a ward of the State admitted by critical care with severe sepsis, aspiration, acute renal failure and UTI.  She has a history of chronic kidney disease stage 3-4 and has been deemed incompetent to make her own decisions. Her legal guardian is Esperanza Richters [phone]).  Today (1/2) Dr. Rowe Pavy spoke with Vito Backers of DSS, as Ms. Lavena Stanford is unavailable.  DSS is in agreement with DNR code status, and if the patient improves they are supportive of PT/OT and SNF placement.  If the patient does not appear to be improving they are supportive of a comfort based approach.  Per Vito Backers, the nephew, Lorenda Hatchet, may be given information regarding his Aunt's health status, but he is not to make any decisions regarding her care.  All decisions to be directed to DSS.    The patient is currently being managed by [facility].  She is NPO with an N/G in place.  VSS.   Labs slowly improving.  She is now alert and able to carry on very brief conversation.  Nephrology is following.   Contacts/Participants in Discussion:  Met with patient.  Discussed via telephone with DSS (Cassandra) Primary Decision Maker: DSS, Esperanza Richters Relationship to Patient Legal guadian. HCPOA: yes    SUMMARY OF RECOMMENDATIONS  1.  Continue DNR status 2.  If Ms. Bickham continues to improve - trial diet, PT/OT when appropriate and possible SNF placement. 3.  If she does not improve recommend comfort measures and consideration of Hospice services.    Code Status/Advance Care  Planning: DNR    Code Status Orders        Start     Ordered   06/12/15 1738  Do not attempt resuscitation (DNR)   Continuous    Question Answer Comment  Maintain current active treatments Yes   Do not initiate new interventions Yes      06/12/15 1737      Other Directives:None  Symptom Management:   None at this time.  Patient appears comfortable.  Palliative Prophylaxis:   Aspiration, Bowel Regimen and Turn Reposition  Additional Recommendations (Limitations, Scope, Preferences):  Continue current scope of treatment for the next 24 - 48 hours.   Psycho-social/Spiritual:  Support System: Poor Desire for further Chaplaincy support:no Additional Recommendations: Caregiving  Support/Resources  Prognosis: Unable to determine  Discharge Planning: [place] for rehab with Palliative care service follow-up   Chief Complaint/ Primary Diagnoses: Present on Admission:  . Nausea vomiting and diarrhea . AKI (acute kidney injury) ([place]) . Dehydration . Sepsis ([place]) . Diarrhea . Acute encephalopathy . Atrial fibrillation ([place]) . Acute renal failure (ARF) ([place]) . Metabolic acidosis . Proteus mirabilis infection . Hyperkalemia . Hypernatremia . Acute renal failure ([place]) . Renal failure  I have reviewed the medical record, interviewed the patient and family, and examined the patient. The following aspects are pertinent.  Past Medical History  Diagnosis Date  . GERD (gastroesophageal reflux disease)   . Complete rupture of rotator cuff   . Pain in joint, shoulder region   . Chronic kidney disease, stage I   . Iron deficiency anemia, unspecified   . Bereavement,  uncomplicated   . Edema   . Hyperglycemia   . Unspecified constipation   . Osteoporosis, unspecified   . OA (osteoarthritis)   . Unspecified essential hypertension   . Other and unspecified hyperlipidemia   . Diverticulosis of colon (without mention of hemorrhage)   . Herpes simplex  keratitis 2009/11/08   Social History   Social History  . Marital Status: Widowed    Spouse Name: N/A  . Number of Children: 1  . Years of Education: 7th grade    Occupational History  . housewife     Social History Main Topics  . Smoking status: Former Games developer  . Smokeless tobacco: None  . Alcohol Use: No  . Drug Use: No  . Sexual Activity: No   Other Topics Concern  . None   Social History Narrative   One son lives locally but rarely sees him . Widowed 11/09/2006 hu8sband died from bladder cancer          Family History  Problem Relation Age of Onset  . Stroke Father   . Dementia Mother   . Dementia Sister   . Cancer Brother     throat   . Nephrolithiasis Brother    Scheduled Meds: . antiseptic oral rinse  7 mL Mouth Rinse q12n4p  . aspirin EC  81 mg Oral Daily  . cefTRIAXone (ROCEPHIN)  IV  1 g Intravenous Q24H  . chlorhexidine  15 mL Mouth Rinse BID  . free water  100 mL Per Tube Q4H  . heparin  5,000 Units Subcutaneous 3 times per day  . metoprolol  2.5 mg Intravenous 4 times per day  . pravastatin  20 mg Oral QHS   Continuous Infusions: .  sodium bicarbonate  infusion 1000 mL 75 mL/hr at 06/17/15 0922   PRN Meds:.sodium chloride, acetaminophen, ondansetron (ZOFRAN) IV Medications Prior to Admission:  Prior to Admission medications   Medication Sig Start Date End Date Taking? Authorizing Provider  Calcium Citrate-Vitamin D (CALCIUM + D PO) Take 1 tablet by mouth daily.   Yes Historical Provider, MD  nystatin cream (MYCOSTATIN) Apply 1 application topically 2 (two) times daily. 03/02/14  Yes Salley Scarlet, MD  acetaminophen (TYLENOL) 500 MG tablet Take 500-1,000 mg by mouth every 6 (six) hours as needed.    Historical Provider, MD  amLODipine (NORVASC) 5 MG tablet Take 1 tablet (5 mg total) by mouth daily. 06/19/14   Salley Scarlet, MD  benazepril (LOTENSIN) 20 MG tablet Take 1 tablet (20 mg total) by mouth daily. 06/19/14   Salley Scarlet, MD  nitrofurantoin,  macrocrystal-monohydrate, (MACROBID) 100 MG capsule Take 1 capsule (100 mg total) by mouth at bedtime. 06/19/14   Salley Scarlet, MD  pravastatin (PRAVACHOL) 20 MG tablet Take 1 tablet (20 mg total) by mouth at bedtime. 06/19/14   Salley Scarlet, MD   No Known Allergies  Review of Systems  Unable to perform ROS   Physical Exam  Constitutional: She appears cachectic.  Cardiovascular: Tachycardia present.   Respiratory: Effort normal.  GI: There is tenderness in the periumbilical area.  Neurological: She is alert. She is disoriented.  Psychiatric: Cognition and memory are impaired.    Vital Signs: BP 123/74 mmHg  Pulse 95  Temp(Src) 98.7 F (37.1 C) (Oral)  Resp 18  Ht 5' (1.524 m)  Wt 58 kg (127 lb 13.9 oz)  BMI 24.97 kg/m2  SpO2 95%  SpO2: SpO2: 95 % O2 Device:SpO2: 95 % O2 Flow Rate: .  IO: Intake/output summary:  Intake/Output Summary (Last 24 hours) at 06/17/15 1027 Last data filed at 06/17/15 0929  Gross per 24 hour  Intake   2475 ml  Output   1175 ml  Net   1300 ml    LBM: Last BM Date: 06/17/15 Baseline Weight: Weight: 58.3 kg (128 lb 8.5 oz) Most recent weight: Weight: 58 kg (127 lb 13.9 oz)      Palliative Assessment/Data:    Additional Data Reviewed:  CBC:    Component Value Date/Time   WBC 5.1 06/17/2015 0238   HGB 10.0* 06/17/2015 0238   HCT 31.1* 06/17/2015 0238   PLT 86* 06/17/2015 0238   MCV 82.3 06/17/2015 0238   NEUTROABS 3.4 06/17/2015 0238   LYMPHSABS 0.9 06/17/2015 0238   MONOABS 0.7 06/17/2015 0238   EOSABS 0.0 06/17/2015 0238   BASOSABS 0.1 06/17/2015 0238   Comprehensive Metabolic Panel:    Component Value Date/Time   NA 145 06/17/2015 0238   K 3.1* 06/17/2015 0238   CL 112* 06/17/2015 0238   CO2 22 06/17/2015 0238   BUN 57* 06/17/2015 0238   CREATININE 2.32* 06/17/2015 0238   CREATININE 1.02* 04/10/2015 1034   GLUCOSE 122* 06/17/2015 0238   CALCIUM 7.5* 06/17/2015 0238   AST 49* 06/17/2015 0238   ALT 24 06/17/2015  0238   ALKPHOS 49 06/17/2015 0238   BILITOT 0.6 06/17/2015 0238   PROT 5.1* 06/17/2015 0238   ALBUMIN 2.1* 06/17/2015 0238     Time In: 9:50 Time Out: 10: 50 Time Total: 60 min Greater than 50%  of this time was spent counseling and coordinating care related to the above assessment and plan.  Signed by: Olga Coaster, PA-C  06/17/2015, 10:27 AM  Please contact Palliative Medicine Team phone at [phone] for questions and concerns.

## 2015-06-17 NOTE — Progress Notes (Signed)
[facility] TEAM 1 - Stepdown/ICU TEAM PROGRESS NOTE  LAZIYA MORELLO [id number] DOB: 11/13/1920 DOA: 06/12/2015 PCP: Vic Blackbird, MD  Admit HPI / Brief Narrative: 82 year old F Hx CKD stage I, Fe deficiency Anemia, HTN, HLD, Diverticulosis of colon, and HSV who lives under the care of her nephew who presented to the ED on 06/12/2015 complaining of abdominal pain. Her nephew provided the history as the patient was encephalopathic. He stated that one day prior to admission she was in her usual health feeling well and they ate out the night before. Overnight she had no significant difficulty but early in the morning on 06/12/2015 she started having nausea, vomiting, diarrhea, and cramping abdominal pain.   In the emergency department she was found to be tachycardic and hypotensive w/ a lactic acid greater than 10, and in acute renal failure. She was given IV fluids, antibiotics, a CT scan of her abdomen was performed and pulmonary and critical care medicine was consulted for further evaluation.   HPI/Subjective: Pt is becoming more alert and interactive.  She will answer some very limited questions, but mostly just stares at the examiner.  Per the RN though, this is more alert than yesterday.  She can not provide a reliable ROS.    Assessment/Plan:  Severe Sepsis due to Proteus UTI  Cont directed abx tx - appears to be slowly improving   Aspiration of vomitus? 12/31 SLP cleared for clear liquid diet - SLP to f/u as mentation improves   Anion gap acute metabolic acidosis -likely 2/2 starvation ketoacidosis + lactate + diarrhea + AKI - resolved   Newly diagnoses Atrial fibrillation w/ RVR -HR reasonably controlled - follow on tele - presently a poor candidate for anticoag given advanced age and frail state/high fall risk   Hypertension  -BP currently well controlled   Acute renal failure  -secondary to hypovolemia + ACE + pyelo - crt continues to improve - follow trend    Diarrhea -likely due to pyelonephritis - extensive search for GI pathogen fruitless thus far - follow   Hyperkalemia >  Hypokalemia  -Kayexalate treatment > now requiring suplementation - follow   Hypernatremia -corrected w/ free water   Toxic metabolic encephalopathy -Minimize sedating medications - slowly improving   Goals of care -Unsure who has official HCPOA, but believe patient has a state appointed guardian -Palliative care working to answer these questions before we discussed issues with family members  Code Status: DNR Family Communication: no family present at time of exam Disposition Plan: SNF v/s hospice care depending on progress over next 48hrs   Consultants: Nephrology  Palliative Care  PCCM  Antibiotics: 12/28 Zosyn > 12/30  12/30 Ceftriaxone >  DVT prophylaxis: SQ heparin  Objective: Blood pressure 123/74, pulse 95, temperature 98.7 F (37.1 C), temperature source Oral, resp. rate 18, height 5' (1.524 m), weight 58 kg (127 lb 13.9 oz), SpO2 95 %.  Intake/Output Summary (Last 24 hours) at 06/17/15 1153 Last data filed at 06/17/15 [id number]  Gross per 24 hour  Intake   2375 ml  Output   1175 ml  Net   1200 ml   Exam: General: No acute respiratory distress Lungs: Clear to auscultation bilaterally without wheezes or crackles Cardiovascular: Regular rate without murmur gallop or rub normal S1 and S2 Abdomen: Nontender, nondistended, soft, bowel sounds positive, no rebound, no ascites, no appreciable mass Extremities: No significant cyanosis, clubbing, or edema bilateral lower extremities  Data Reviewed: Basic Metabolic Panel:  Recent Labs Lab 06/13/15  1350 06/14/15 0449  06/15/15 0432  06/15/15 1841 06/15/15 2219 06/16/15 0420 06/16/15 1100 06/17/15 0238  NA  --  QUANTITY NOT SUFFICIENT, UNABLE TO PERFORM TEST  < > 155*  < > 150* 151* 150* 147* 145  K 5.9* QUANTITY NOT SUFFICIENT, UNABLE TO PERFORM TEST  < > 4.0  < > 3.6 3.7 3.4* 3.8 3.1*   CL  --  QUANTITY NOT SUFFICIENT, UNABLE TO PERFORM TEST  < > 124*  < > 119* 120* 119* 119* 112*  CO2  --  QUANTITY NOT SUFFICIENT, UNABLE TO PERFORM TEST  < > 16*  < > 17* 18* 20* 17* 22  GLUCOSE  --  77  < > 121*  < > 142* 124* 123* 137* 122*  BUN  --  QUANTITY NOT SUFFICIENT, UNABLE TO PERFORM TEST  < > 84*  < > 82* 79* 76* 73* 57*  CREATININE  --  4.89*  < > 4.57*  < > 3.84* 3.68* 3.44* 3.01* 2.32*  CALCIUM  --  QUANTITY NOT SUFFICIENT, UNABLE TO PERFORM TEST  < > 7.9*  < > 8.0* 8.0* 7.9* 7.6* 7.5*  MG 2.5* 2.5*  --  2.6*  --   --   --  2.1  --  1.8  < > = values in this interval not displayed.  CBC:  Recent Labs Lab 06/14/15 0449 06/14/15 0815 06/15/15 0432 06/16/15 0420 06/17/15 0238  WBC 7.0 6.8 8.3 4.8 5.1  NEUTROABS 6.3 6.0 7.3 3.7 3.4  HGB 11.5* 10.1* 9.7* 9.9* 10.0*  HCT 35.2* 32.2* 30.0* 30.6* 31.1*  MCV 81.7 82.8 81.3 81.0 82.3  PLT 105* 137* 140* 112* 86*    Liver Function Tests:  Recent Labs Lab 06/14/15 0449 06/14/15 0815 06/15/15 0432 06/16/15 0420 06/17/15 0238  AST 62* 58* 58* 61* 49*  ALT 23 21 23 27 24   ALKPHOS 50 49 49 53 49  BILITOT 1.0 1.0 0.8 0.5 0.6  PROT 5.8* 5.5* 5.2* 5.4* 5.1*  ALBUMIN 2.7* 2.4* 2.4* 2.2* 2.1*   CBG:  Recent Labs Lab 06/12/15 1612  GLUCAP 127*    Recent Results (from the past 240 hour(s))  Culture, blood (routine x 2)     Status: None   Collection Time: 06/12/15  4:05 PM  Result Value Ref Range Status   Specimen Description BLOOD LEFT ARM  Final   Special Requests IN PEDIATRIC BOTTLE 2CC  Final   Culture NO GROWTH 5 DAYS  Final   Report Status 06/17/2015 FINAL  Final  Culture, blood (routine x 2)     Status: None   Collection Time: 06/12/15  4:12 PM  Result Value Ref Range Status   Specimen Description BLOOD RIGHT ARM  Final   Special Requests IN PEDIATRIC BOTTLE 2CC  Final   Culture NO GROWTH 5 DAYS  Final   Report Status 06/17/2015 FINAL  Final  Urine culture     Status: None   Collection Time: 06/12/15   5:59 PM  Result Value Ref Range Status   Specimen Description URINE, CATHETERIZED  Final   Special Requests NONE  Final   Culture >=100,000 COLONIES/mL PROTEUS MIRABILIS  Final   Report Status 06/14/2015 FINAL  Final   Organism ID, Bacteria PROTEUS MIRABILIS  Final      Susceptibility   Proteus mirabilis - MIC*    AMPICILLIN <=2 SENSITIVE Sensitive     CEFAZOLIN <=4 SENSITIVE Sensitive     CEFTRIAXONE <=1 SENSITIVE Sensitive     CIPROFLOXACIN <=0.25  SENSITIVE Sensitive     GENTAMICIN <=1 SENSITIVE Sensitive     IMIPENEM <=0.25 SENSITIVE Sensitive     NITROFURANTOIN 256 RESISTANT Resistant     TRIMETH/SULFA <=20 SENSITIVE Sensitive     AMPICILLIN/SULBACTAM <=2 SENSITIVE Sensitive     * >=100,000 COLONIES/mL PROTEUS MIRABILIS  MRSA PCR Screening     Status: None   Collection Time: 06/12/15  7:16 PM  Result Value Ref Range Status   MRSA by PCR NEGATIVE NEGATIVE Final    Comment:        The GeneXpert MRSA Assay (FDA approved for NASAL specimens only), is one component of a comprehensive MRSA colonization surveillance program. It is not intended to diagnose MRSA infection nor to guide or monitor treatment for MRSA infections.   Gastrointestinal Panel by PCR , Stool     Status: None   Collection Time: 06/13/15  3:32 PM  Result Value Ref Range Status   Campylobacter species NOT DETECTED NOT DETECTED Final   Plesimonas shigelloides NOT DETECTED NOT DETECTED Final   Salmonella species NOT DETECTED NOT DETECTED Final   Yersinia enterocolitica NOT DETECTED NOT DETECTED Final   Vibrio species NOT DETECTED NOT DETECTED Final   Vibrio cholerae NOT DETECTED NOT DETECTED Final   Enteroaggregative E coli (EAEC) NOT DETECTED NOT DETECTED Final   Enteropathogenic E coli (EPEC) NOT DETECTED NOT DETECTED Final   Enterotoxigenic E coli (ETEC) NOT DETECTED NOT DETECTED Final   Shiga like toxin producing E coli (STEC) NOT DETECTED NOT DETECTED Final   E. coli O157 NOT DETECTED NOT DETECTED  Final   Shigella/Enteroinvasive E coli (EIEC) NOT DETECTED NOT DETECTED Final   Cryptosporidium NOT DETECTED NOT DETECTED Final   Cyclospora cayetanensis NOT DETECTED NOT DETECTED Final   Entamoeba histolytica NOT DETECTED NOT DETECTED Final   Giardia lamblia NOT DETECTED NOT DETECTED Final   Adenovirus F40/41 NOT DETECTED NOT DETECTED Final   Astrovirus NOT DETECTED NOT DETECTED Final   Norovirus GI/GII NOT DETECTED NOT DETECTED Final   Rotavirus A NOT DETECTED NOT DETECTED Final   Sapovirus (I, II, IV, and V) NOT DETECTED NOT DETECTED Final  C difficile quick scan w PCR reflex     Status: None   Collection Time: 06/16/15 12:44 PM  Result Value Ref Range Status   C Diff antigen NEGATIVE NEGATIVE Final   C Diff toxin NEGATIVE NEGATIVE Final   C Diff interpretation Negative for toxigenic C. difficile  Final     Studies:   Recent x-ray studies have been reviewed in detail by the Attending Physician  Scheduled Meds:  Scheduled Meds: . antiseptic oral rinse  7 mL Mouth Rinse q12n4p  . aspirin EC  81 mg Oral Daily  . cefTRIAXone (ROCEPHIN)  IV  1 g Intravenous Q24H  . chlorhexidine  15 mL Mouth Rinse BID  . free water  100 mL Per Tube Q4H  . heparin  5,000 Units Subcutaneous 3 times per day  . metoprolol  2.5 mg Intravenous 4 times per day  . pravastatin  20 mg Oral QHS    Time spent on care of this patient: 35 mins   MCCLUNG,JEFFREY T , MD   [facility] Office  [phone] Pager - Text Page per Shea Evans as per below:  On-Call/Text Page:      [email or web address]      password [email or web address]  If 7PM-7AM, please contact night-coverage [email or web address] Password [email or web address] 06/17/2015, 11:53 AM   LOS: 5 days

## 2015-06-18 DIAGNOSIS — I10 Essential (primary) hypertension: Secondary | ICD-10-CM | POA: Insufficient documentation

## 2015-06-18 DIAGNOSIS — T17910A Gastric contents in respiratory tract, part unspecified causing asphyxiation, initial encounter: Secondary | ICD-10-CM | POA: Insufficient documentation

## 2015-06-18 DIAGNOSIS — E876 Hypokalemia: Secondary | ICD-10-CM | POA: Insufficient documentation

## 2015-06-18 LAB — GLUCOSE, CAPILLARY
GLUCOSE-CAPILLARY: 115 mg/dL — AB (ref 65–99)
Glucose-Capillary: 128 mg/dL — ABNORMAL HIGH (ref 65–99)

## 2015-06-18 LAB — CBC
HCT: 32 % — ABNORMAL LOW (ref 36.0–46.0)
Hemoglobin: 9.9 g/dL — ABNORMAL LOW (ref 12.0–15.0)
MCH: 25.6 pg — ABNORMAL LOW (ref 26.0–34.0)
MCHC: 30.9 g/dL (ref 30.0–36.0)
MCV: 82.7 fL (ref 78.0–100.0)
PLATELETS: 114 10*3/uL — AB (ref 150–400)
RBC: 3.87 MIL/uL (ref 3.87–5.11)
RDW: 16.9 % — AB (ref 11.5–15.5)
WBC: 7 10*3/uL (ref 4.0–10.5)

## 2015-06-18 LAB — RENAL FUNCTION PANEL
ANION GAP: 10 (ref 5–15)
Albumin: 1.9 g/dL — ABNORMAL LOW (ref 3.5–5.0)
BUN: 44 mg/dL — AB (ref 6–20)
CALCIUM: 7.6 mg/dL — AB (ref 8.9–10.3)
CO2: 25 mmol/L (ref 22–32)
CREATININE: 1.92 mg/dL — AB (ref 0.44–1.00)
Chloride: 106 mmol/L (ref 101–111)
GFR calc Af Amer: 25 mL/min — ABNORMAL LOW (ref >60)
GFR calc non Af Amer: 22 mL/min — ABNORMAL LOW (ref >60)
GLUCOSE: 136 mg/dL — AB (ref 65–99)
POTASSIUM: 3.1 mmol/L — AB (ref 3.5–5.1)
Phosphorus: 2.5 mg/dL (ref 2.5–4.6)
Sodium: 141 mmol/L (ref 135–145)

## 2015-06-18 LAB — MAGNESIUM: MAGNESIUM: 1.5 mg/dL — AB (ref 1.7–2.4)

## 2015-06-18 MED ORDER — JEVITY 1.2 CAL PO LIQD
1000.0000 mL | ORAL | Status: DC
  Administered 2015-06-18 – 2015-06-20 (×3): 1000 mL
  Filled 2015-06-18 (×7): qty 1000

## 2015-06-18 MED ORDER — POTASSIUM CHLORIDE 10 MEQ/100ML IV SOLN
10.0000 meq | INTRAVENOUS | Status: AC
  Administered 2015-06-18 (×3): 10 meq via INTRAVENOUS
  Filled 2015-06-18 (×3): qty 100

## 2015-06-18 MED ORDER — JEVITY 1.2 CAL PO LIQD: 1000.0000 mL | ORAL | Status: DC

## 2015-06-18 MED ORDER — DEXTROSE 5 % IV SOLN
3.0000 g | Freq: Once | INTRAVENOUS | Status: AC
  Administered 2015-06-18: 3 g via INTRAVENOUS
  Filled 2015-06-18: qty 6

## 2015-06-18 MED ORDER — POTASSIUM CHLORIDE 10 MEQ/100ML IV SOLN
10.0000 meq | INTRAVENOUS | Status: AC
  Administered 2015-06-18 (×2): 10 meq via INTRAVENOUS
  Filled 2015-06-18 (×2): qty 100

## 2015-06-18 NOTE — Progress Notes (Signed)
[name] [name] TEAM 1 - Stepdown/ICU TEAM Progress Note  [name] [name] MGQ:[id number] DOB: May 26, 1921 DOA: 06/12/2015 PCP: [name] Blackbird, MD  Admit HPI / Brief Narrative: 82 year old BF PMHx CKD stage I, Fe deficiency Anemia, HTN, HLD, Diverticulosis of colon,Herpes Simplex.  Lives under the care of her nephew who presented to the [facility] emergency department on 06/12/2015 complaining of abdominal pain. Her nephew provides the history as the patient was encephalopathic. He states that one day prior to admission she was in her usual health feeling well and they ate out the night before. Overnight she had no significant difficulty but early in the morning on 06/12/2015 she started had significant nausea, vomiting, diarrhea, and cramping abdominal pain. Because this persisted and because of the severity of her symptoms he brought her to the emergency department. In the emergency department she was found to be tachycardic, hypotensive, had a lactic acid greater than 10, and to be in acute renal failure. She was given IV fluids, antibiotics, a CT scan of her abdomen was performed and pulmonary and critical care medicine was consulted for further evaluation. After receiving IV fluids and antibiotics she felt significant better  HPI/Subjective: 1/3 T-max overnight= 38.1, initially hypothermic on admission. A/O 0, although patient is awake and alert. Will not follow commands.    Assessment/Plan: Sepsis unspecified organism -Urine positive Proteus Mirabilis; Acute on Chronic UTI ; infectious diarrhea? -Blood cultures pending, GI pathogen panel pending -Continue current antibiotic -On admission patient met criteria for sepsis -Cortrack NG tube, continue free water 245m q 4hr  -Strict in and out; since admission + 9.3 L  -Daily weight admission bed weight= 58.3 kg           1/3 bed weight= ??? -Patient has very poor probability of surviving hospitalization - Aspiration of vomitus? -12/31  past swallow study; clear liquid diet. NOTE patient refuses to eat -PCXR; new left pleural effusion see results below    Anion gap acute metabolic acidosis -likely 2/2 starvation ketoacidosis + Lactate + diarrhea + AKI -See sepsis -Resolved  Newly Dx Atrial fibrillation unspecified type  -Metoprolol 12.5 mg BID -TSH WNL  Hypertension  -See atrial fibrillation   Acute renal failure  -Most likely secondary to volume depletion -See sepsis - consulted Nephrology -Cr improving   Diarrhea -Possibly secondary to Acute viral gastroenteritis with proctitis, infectious diarrhea?, ischemic bowel R/Oed by abdominal CT  -GI panel by PCR negative -Fecal lactoferrin positive  Hyperkalemia/Hypokalemia -Potassium goal > -Potassium IV 50 mEq   Hypomagnesemia -Magnesium goal> 2 - Magnesium IV 3 gm  Hypernatremia -See sepsis -resolved  Acute encephalopathy, -Minimize sedating medications  Goals of care -In this patient's case believe formal hospice would be recommended course of action. -Official HCPOA, [employer] [phone] -Palliative care; what does HCPOA request as short-term vs long-term goals of care.    Code Status: DO NOT RESUSCITATE  Family Communication:  No family present at time of exam Disposition Plan: SNF    Consultants: [name] [name]   Procedure/Significant Events: 06/12/2015 CT scan of the abdomen: Evidence of proctocolitis, negative SBO/peritoneal fluid/perforation. 12/30 PCXR; new moderate left pleural effusion. -Chronic posterior right diaphragmatic hernia containing a portion of liver, right kidney and right adrenal gland.    Culture 12/28 Blood culture left/right arm negative final 12/28 Urine positive Proteus Mirabilis  12/29 GI panel negative 12/31 C. difficile by PCR negative   Antibiotics: 12/28 Zosyn>> 12/30 12/30 ceftriaxone>>   DVT  prophylaxis: Subcutaneous  heparin   Devices    LINES / TUBES:      Continuous Infusions: . feeding supplement (JEVITY 1.2 CAL) 1,000 mL (06/17/15 1806)  .  sodium bicarbonate  infusion 1000 mL 75 mL/hr at 06/17/15 2037    Objective: VITAL SIGNS: Temp: 98.9 F (37.2 C) (01/03 0700) Temp Source: Oral (01/03 0400) BP: 100/52 mmHg (01/03 0700) Pulse Rate: 101 (01/03 0700) SPO2; FIO2:   Intake/Output Summary (Last 24 hours) at 06/18/15 0827 Last data filed at 06/18/15 0600  Gross per 24 hour  Intake   2360 ml  Output    775 ml  Net   1585 ml     Exam: General:  A/O 0, although patient is awake and alert. Will not follow commands.  No acute respiratory distress Eyes: Negative headache, negative scleral hemorrhage ENT: Negative Runny nose, negative gingival bleeding, Neck:  Negative scars, masses, torticollis, lymphadenopathy, JVD Lungs: Clear to auscultation bilaterally without wheezes or crackles Cardiovascular:  Irregular irregular rhythm and rate, negative murmur gallop or rub normal S1 and S2 Abdomen: negative Abdominal pain to palpation , nondistended, positive soft, bowel sounds, no rebound, no ascites, no appreciable mass Extremities: No significant cyanosis, clubbing, or edema bilateral lower extremities Psychiatric:  Unable to evaluate secondary to patient's altered mental status Neurologic:  Patient will move all extremities to painful stimuli, will open eyes to painful stimuli. Unable to evaluate further secondary to altered mental status  Data Reviewed: Basic Metabolic Panel:  Recent Labs Lab 06/14/15 0449  06/15/15 0432  06/15/15 2219 06/16/15 0420 06/16/15 1100 06/17/15 0238 06/18/15 0228  NA QUANTITY NOT SUFFICIENT, UNABLE TO PERFORM TEST  < > 155*  < > 151* 150* 147* 145 141  K QUANTITY NOT SUFFICIENT, UNABLE TO PERFORM TEST  < > 4.0  < > 3.7 3.4* 3.8 3.1* 3.1*  CL QUANTITY NOT SUFFICIENT, UNABLE TO PERFORM TEST  < > 124*  < > 120* 119* 119* 112* 106  CO2 QUANTITY NOT  SUFFICIENT, UNABLE TO PERFORM TEST  < > 16*  < > 18* 20* 17* 22 25  GLUCOSE 77  < > 121*  < > 124* 123* 137* 122* 136*  BUN QUANTITY NOT SUFFICIENT, UNABLE TO PERFORM TEST  < > 84*  < > 79* 76* 73* 57* 44*  CREATININE 4.89*  < > 4.57*  < > 3.68* 3.44* 3.01* 2.32* 1.92*  CALCIUM QUANTITY NOT SUFFICIENT, UNABLE TO PERFORM TEST  < > 7.9*  < > 8.0* 7.9* 7.6* 7.5* 7.6*  MG 2.5*  --  2.6*  --   --  2.1  --  1.8 1.5*  PHOS  --   --   --   --   --   --   --   --  2.5  < > = values in this interval not displayed. Liver Function Tests:  Recent Labs Lab 06/14/15 0449 06/14/15 0815 06/15/15 0432 06/16/15 0420 06/17/15 0238 06/18/15 0228  AST 62* 58* 58* 61* 49*  --   ALT _0 --   ALKPHOS 50 49 49 53 49  --   BILITOT 1.0 1.0 0.8 0.5 0.6  --   PROT 5.8* 5.5* 5.2* 5.4* 5.1*  --   ALBUMIN 2.7* 2.4* 2.4* 2.2* 2.1* 1.9*   No results for input(s): LIPASE, AMYLASE in the last 168 hours. No results for input(s): AMMONIA in the last 168 hours. CBC:  Recent Labs Lab 06/14/15 0449 06/14/15 0815 06/15/15 0432 06/16/15  7972 06/17/15 0238 06/18/15 0228  WBC 7.0 6.8 8.3 4.8 5.1 7.0  NEUTROABS 6.3 6.0 7.3 3.7 3.4  --   HGB 11.5* 10.1* 9.7* 9.9* 10.0* 9.9*  HCT 35.2* 32.2* 30.0* 30.6* 31.1* 32.0*  MCV 81.7 82.8 81.3 81.0 82.3 82.7  PLT 105* 137* 140* 112* 86* 114*   Cardiac Enzymes: No results for input(s): CKTOTAL, CKMB, CKMBINDEX, TROPONINI in the last 168 hours. BNP (last 3 results) No results for input(s): BNP in the last 8760 hours.  ProBNP (last 3 results) No results for input(s): PROBNP in the last 8760 hours.  CBG:  Recent Labs Lab 06/12/15 1612  GLUCAP 127*    Recent Results (from the past 240 hour(s))  Culture, blood (routine x 2)     Status: None   Collection Time: 06/12/15  4:05 PM  Result Value Ref Range Status   Specimen Description BLOOD LEFT ARM  Final   Special Requests IN PEDIATRIC BOTTLE 2CC  Final   Culture NO GROWTH 5 DAYS  Final   Report Status  06/17/2015 FINAL  Final  Culture, blood (routine x 2)     Status: None   Collection Time: 06/12/15  4:12 PM  Result Value Ref Range Status   Specimen Description BLOOD RIGHT ARM  Final   Special Requests IN PEDIATRIC BOTTLE 2CC  Final   Culture NO GROWTH 5 DAYS  Final   Report Status 06/17/2015 FINAL  Final  Urine culture     Status: None   Collection Time: 06/12/15  5:59 PM  Result Value Ref Range Status   Specimen Description URINE, CATHETERIZED  Final   Special Requests NONE  Final   Culture >=100,000 COLONIES/mL PROTEUS MIRABILIS  Final   Report Status 06/14/2015 FINAL  Final   Organism ID, Bacteria PROTEUS MIRABILIS  Final      Susceptibility   Proteus mirabilis - MIC*    AMPICILLIN <=2 SENSITIVE Sensitive     CEFAZOLIN <=4 SENSITIVE Sensitive     CEFTRIAXONE <=1 SENSITIVE Sensitive     CIPROFLOXACIN <=0.25 SENSITIVE Sensitive     GENTAMICIN <=1 SENSITIVE Sensitive     IMIPENEM <=0.25 SENSITIVE Sensitive     NITROFURANTOIN 256 RESISTANT Resistant     TRIMETH/SULFA <=20 SENSITIVE Sensitive     AMPICILLIN/SULBACTAM <=2 SENSITIVE Sensitive     * >=100,000 COLONIES/mL PROTEUS MIRABILIS  MRSA PCR Screening     Status: None   Collection Time: 06/12/15  7:16 PM  Result Value Ref Range Status   MRSA by PCR NEGATIVE NEGATIVE Final    Comment:        The GeneXpert MRSA Assay (FDA approved for NASAL specimens only), is one component of a comprehensive MRSA colonization surveillance program. It is not intended to diagnose MRSA infection nor to guide or monitor treatment for MRSA infections.   Gastrointestinal Panel by PCR , Stool     Status: None   Collection Time: 06/13/15  3:32 PM  Result Value Ref Range Status   Campylobacter species NOT DETECTED NOT DETECTED Final   Plesimonas shigelloides NOT DETECTED NOT DETECTED Final   Salmonella species NOT DETECTED NOT DETECTED Final   Yersinia enterocolitica NOT DETECTED NOT DETECTED Final   Vibrio species NOT DETECTED NOT  DETECTED Final   Vibrio cholerae NOT DETECTED NOT DETECTED Final   Enteroaggregative E coli (EAEC) NOT DETECTED NOT DETECTED Final   Enteropathogenic E coli (EPEC) NOT DETECTED NOT DETECTED Final   Enterotoxigenic E coli (ETEC) NOT DETECTED NOT DETECTED Final  Shiga like toxin producing E coli (STEC) NOT DETECTED NOT DETECTED Final   E. coli O157 NOT DETECTED NOT DETECTED Final   Shigella/Enteroinvasive E coli (EIEC) NOT DETECTED NOT DETECTED Final   Cryptosporidium NOT DETECTED NOT DETECTED Final   Cyclospora cayetanensis NOT DETECTED NOT DETECTED Final   Entamoeba histolytica NOT DETECTED NOT DETECTED Final   Giardia lamblia NOT DETECTED NOT DETECTED Final   Adenovirus F40/41 NOT DETECTED NOT DETECTED Final   Astrovirus NOT DETECTED NOT DETECTED Final   Norovirus GI/GII NOT DETECTED NOT DETECTED Final   Rotavirus A NOT DETECTED NOT DETECTED Final   Sapovirus (I, II, IV, and V) NOT DETECTED NOT DETECTED Final  C difficile quick scan w PCR reflex     Status: None   Collection Time: 06/16/15 12:44 PM  Result Value Ref Range Status   C Diff antigen NEGATIVE NEGATIVE Final   C Diff toxin NEGATIVE NEGATIVE Final   C Diff interpretation Negative for toxigenic C. difficile  Final     Studies:  Recent x-ray studies have been reviewed in detail by the Attending Physician  Scheduled Meds:  Scheduled Meds: . antiseptic oral rinse  7 mL Mouth Rinse q12n4p  . cefTRIAXone (ROCEPHIN)  IV  1 g Intravenous Q24H  . chlorhexidine  15 mL Mouth Rinse BID  . free water  200 mL Per Tube Q4H  . heparin  5,000 Units Subcutaneous 3 times per day  . metoprolol tartrate  12.5 mg Per Tube BID    Time spent on care of this patient: 40 mins   WOODS, Geraldo Docker , MD  [facility] Office  [phone] Pager - [phone]  On-Call/Text Page:      [email or web address]      password [email or web address]  If 7PM-7AM, please contact night-coverage [email or web address] Password [email or web address] 06/18/2015, 8:27 AM   LOS: 6 days   Care  during the described time interval was provided by me .  I have reviewed this patient's available data, including medical history, events of note, physical examination, and all test results as part of my evaluation. I have personally reviewed and interpreted all radiology studies.   Dia Crawford, MD [phone] Pager

## 2015-06-18 NOTE — Evaluation (Signed)
Occupational Therapy Evaluation Patient Details Name: TEARA BRANAM MRN: [id number] DOB: [birth year] Today's Date: 06/18/2015    History of Present Illness 82 year old F Hx CKD stage I, Fe deficiency Anemia, HTN, HLD, Diverticulosis of colon, and HSV who lives under the care of her nephew who presented to the ED on 06/12/2015 complaining of abdominal pain. Her nephew provided the history as the patient was encephalopathic. He stated that one day prior to admission she was in her usual health feeling well and they ate out the night before. Overnight she had no significant difficulty but early in the morning on 06/12/2015 she started having nausea, vomiting, diarrhea, and cramping abdominal pain.    Clinical Impression   Pt admitted with above. She demonstrates the below listed deficits and will benefit from continued OT to maximize safety and independence with BADLs.  Pt attempted to follow one 1 step command, but unable to sustain attention to complete command, and was only able to initiate movement asked.  She was no verbal, and made no attempt to interact with therapist or her environment.  She requires total A for ADLs and functional mobility, and has poor social supports.   Likely will need SNF/long term care.  Will follow acutely.       Follow Up Recommendations  SNF    Equipment Recommendations  None recommended by OT    Recommendations for Other Services       Precautions / Restrictions Precautions Precautions: Fall Restrictions Weight Bearing Restrictions: No      Mobility Bed Mobility Overal bed mobility: Needs Assistance;+2 for physical assistance Bed Mobility: Supine to Sit;Rolling     Supine to sit: Total assist     General bed mobility comments: Pt made no attempt to assist   Transfers                 General transfer comment: unable to attempt safely     Balance Overall balance assessment: Needs assistance Sitting-balance support: Feet  supported;Bilateral upper extremity supported Sitting balance-Leahy Scale: Zero Sitting balance - Comments: Pt required max A to maintain EOB sitting.  Heavy posterior lean  Postural control: Posterior lean                                  ADL Overall ADL's : Needs assistance/impaired Eating/Feeding: NPO   Grooming: Total assistance   Upper Body Bathing: Total assistance   Lower Body Bathing: Total assistance   Upper Body Dressing : Total assistance   Lower Body Dressing: Total assistance   Toilet Transfer: Total assistance   Toileting- Clothing Manipulation and Hygiene: Total assistance       Functional mobility during ADLs: Maximal assistance;Total assistance General ADL Comments: Pt unable to engage in ADL tasks even when hand over hand assist provided      Vision     Perception     Praxis      Pertinent Vitals/Pain Pain Assessment: Faces Faces Pain Scale: No hurt     Hand Dominance  (unable to determine )   Extremity/Trunk Assessment Upper Extremity Assessment Upper Extremity Assessment: Difficult to assess due to impaired cognition ([facility].  Pt made littel attempts to move arms)   Lower Extremity Assessment Lower Extremity Assessment: Generalized weakness   Cervical / Trunk Assessment Cervical / Trunk Assessment: Kyphotic   Communication Communication Communication: Other (comment) (pt non verbal )   Cognition Arousal/Alertness: Awake/alert Behavior During Therapy: Flat  affect Overall Cognitive Status: Impaired/Different from baseline Area of Impairment: Attention   Current Attention Level: Focused           General Comments: Pt attempted to follow one one step motor command to lift her right UE, but unable to complete movement before losing focus.  Pt made no other attempts to follow commands, interact with therapist or environment, or to communicate    General Comments       Exercises       Shoulder Instructions       Home Living Family/patient expects to be discharged to:: Private residence Living Arrangements: Other relatives (nephew) Available Help at Discharge: Family (unsure) Type of Home: Mobile home Home Access: Stairs to enter Entrance Stairs-Number of Steps: 4 Entrance Stairs-Rails: Can reach both;Right;Left Home Layout: One level     Bathroom Shower/Tub: Occupational psychologist: Standard Bathroom Accessibility: Yes   Home Equipment: Environmental consultant - 2 wheels;Cane - single point;Bedside commode;Shower seat          Prior Functioning/Environment          Comments: Pt unable to provide info re: PLOF.  Per chart, pt was independent in 7/15    OT Diagnosis: Generalized weakness;Cognitive deficits;Altered mental status   OT Problem List: Decreased strength;Decreased range of motion;Decreased activity tolerance;Impaired balance (sitting and/or standing);Decreased cognition;Decreased coordination;Decreased safety awareness;Decreased knowledge of use of DME or AE;Decreased knowledge of precautions   OT Treatment/Interventions: Self-care/ADL training;DME and/or AE instruction;Therapeutic activities;Cognitive remediation/compensation;Patient/family education;Balance training    OT Goals(Current goals can be found in the care plan section) Acute Rehab OT Goals OT Goal Formulation: Patient unable to participate in goal setting Time For Goal Achievement: 07/02/15 Potential to Achieve Goals: Fair ADL Goals Pt Will Perform Grooming: with mod assist;sitting Pt Will Transfer to Toilet: with max assist;stand pivot transfer;bedside commode Additional ADL Goal #1: Pt will follow one step motor commands at least 25% of time  Additional ADL Goal #2: Pt will maintain EOB sitting x 10 mins with min A in prep for ADLs.   OT Frequency: Min 2X/week   Barriers to D/C: Decreased caregiver support          Co-evaluation              End of Session Nurse Communication: Mobility  status  Activity Tolerance: Other (comment) (impaired cognition ) Patient left: in bed;with call bell/phone within reach;with bed alarm set   Time: 1128-1140 OT Time Calculation (min): 12 min Charges:  OT General Charges $OT Visit: 1 Procedure OT Evaluation $OT Eval Moderate Complexity: 1 Procedure G-Codes:    Lucille Passy M 2015/06/30, 6:34 PM

## 2015-06-18 NOTE — Care Management Important Message (Signed)
Important Message  Patient Details  Name: [name] [name] MRN: [id number] Date of Birth: [birth year]   Medicare Important Message Given:  Yes    Mauricio Dahlen Abena 06/18/2015, 11:39 AM

## 2015-06-18 NOTE — Clinical Social Work Placement (Signed)
   CLINICAL SOCIAL WORK PLACEMENT  NOTE  Date:  06/18/2015  Patient Details  Name: [name] [name] MRN: [id number] Date of Birth: 10/27/1920  Clinical Social Work is seeking post-discharge placement for this patient at the [place] level of care (*CSW will initial, date and re-position this form in  chart as items are completed):  Yes   Patient/family provided with [place] Work Department's list of facilities offering this level of care within the geographic area requested by the patient (or if unable, by the patient's family).  Yes   Patient/family informed of their freedom to choose among providers that offer the needed level of care, that participate in Medicare, Medicaid or managed care program needed by the patient, have an available bed and are willing to accept the patient.  Yes   Patient/family informed of [facility]'s ownership interest in [facility] and [facility], as well as of the fact that they are under no obligation to receive care at these facilities.  PASRR submitted to EDS on 06/18/15     PASRR number received on 06/18/15     Existing PASRR number confirmed on       FL2 transmitted to all facilities in geographic area requested by pt/family on       FL2 transmitted to all facilities within larger geographic area on       Patient informed that his/her managed care company has contracts with or will negotiate with certain facilities, including the following:            Patient/family informed of bed offers received.  Patient chooses bed at       Physician recommends and patient chooses bed at      Patient to be transferred to   on  .  Patient to be transferred to facility by       Patient family notified on   of transfer.  Name of family member notified:        PHYSICIAN Please sign FL2, Please sign DNR     Additional Comment:    _______________________________________________ Cranford Mon,  LCSW 06/18/2015, 3:51 PM

## 2015-06-18 NOTE — Evaluation (Signed)
Physical Therapy Evaluation Patient Details Name: [name] [name] MRN: [id number] DOB: 1920-09-02 Today's Date: 06/18/2015   History of Present Illness  82 year old F Hx CKD stage I, Fe deficiency Anemia, HTN, HLD, Diverticulosis of colon, and HSV who lives under the care of her nephew who presented to the ED on 06/12/2015 complaining of abdominal pain. Her nephew provided the history as the patient was encephalopathic. He stated that one day prior to admission she was in her usual health feeling well and they ate out the night before. Overnight she had no significant difficulty but early in the morning on 06/12/2015 she started having nausea, vomiting, diarrhea, and cramping abdominal pain.   Clinical Impression  Pt admitted with above diagnosis. Pt currently with functional limitations due to the deficits listed below (see PT Problem List). Pt with difficulty with all mobility to include bed mobility and attempts at sit to stand.  Pt with poor strength in LEs with rigid posture during evaluation.  Pt not speaking to PT even when asked questions.  Pt with mitts and wrist restraints on due to safety issues.  Pt with urinary and bowel incontinence in which PT and NT cleaned with total assist.  Unsure of home situation but pt will need 24 hour assist on d/c and feel that SNF with therapy would be beneficial for pt.  Will follow acutely.   Pt will benefit from skilled PT to increase their independence and safety with mobility to allow discharge to the venue listed below.      Follow Up Recommendations SNF;Supervision/Assistance - 24 hour    Equipment Recommendations  Other (comment) (TBA)    Recommendations for Other Services       Precautions / Restrictions Precautions Precautions: Fall Restrictions Weight Bearing Restrictions: No      Mobility  Bed Mobility Overal bed mobility: Needs Assistance;+2 for physical assistance Bed Mobility: Supine to Sit;Rolling Rolling: Max assist    Supine to sit: Total assist;+2 for physical assistance     General bed mobility comments: pt needed assist for LEs and for elevation of trunk.Does initiate movement to roll bil.   Transfers Overall transfer level: Needs assistance Equipment used: 2 person hand held assist Transfers: Sit to/from Stand Sit to Stand: Total assist;+2 physical assistance         General transfer comment: Attempted to stand pt but pt could not stand even with +2 assist.   Ambulation/Gait                Stairs            Wheelchair Mobility    Modified Rankin (Stroke Patients Only)       Balance Overall balance assessment: Needs assistance;History of Falls Sitting-balance support: Bilateral upper extremity supported;Feet supported Sitting balance-Leahy Scale: Poor Sitting balance - Comments: Needs UE support and total assist to sit EOB with posterior lean Postural control: Posterior lean Standing balance support: Bilateral upper extremity supported;During functional activity Standing balance-Leahy Scale: Zero Standing balance comment: could not achieve upright stance with total assist of 2                             Pertinent Vitals/Pain Pain Assessment: No/denies pain  VSS    Home Living Family/patient expects to be discharged to:: Private residence Living Arrangements: Other relatives (nephew) Available Help at Discharge: Family (unsure) Type of Home: Mobile home Home Access: Stairs to enter Entrance Stairs-Rails: Can reach both;Right;Left Entrance Stairs-Number  of Steps: 4 Home Layout: One level Home Equipment: [place] - 2 wheels;Cane - single point;Bedside commode;Shower seat      Prior Function           Comments: Independent in 7/15 per chart however difficult to determine prior level of function. Pt did not answer questions.      Hand Dominance        Extremity/Trunk Assessment   Upper Extremity Assessment: Defer to OT evaluation            Lower Extremity Assessment: Generalized weakness;RLE deficits/detail;LLE deficits/detail RLE Deficits / Details: tone noted  LLE Deficits / Details: tone noted  Cervical / Trunk Assessment: Kyphotic  Communication   Communication:  (pt verbalized very little)  Cognition Arousal/Alertness: Awake/alert Behavior During Therapy: Flat affect Overall Cognitive Status: History of cognitive impairments - at baseline                      General Comments General comments (skin integrity, edema, etc.): Noted pt with BM once attempted to stand.  Assisted pt back to bed and cleaned pt and changed all linens with total assist.     Exercises        Assessment/Plan    PT Assessment Patient needs continued PT services  PT Diagnosis Generalized weakness   PT Problem List Decreased activity tolerance;Decreased balance;Decreased mobility;Decreased knowledge of use of DME;Decreased safety awareness;Decreased knowledge of precautions;Decreased cognition  PT Treatment Interventions DME instruction;Gait training;Functional mobility training;Balance training;Therapeutic exercise;Therapeutic activities;Patient/family education   PT Goals (Current goals can be found in the Care Plan section) Acute Rehab PT Goals Patient Stated Goal: unable to state PT Goal Formulation: Patient unable to participate in goal setting Time For Goal Achievement: 07/02/15 Potential to Achieve Goals: Fair    Frequency Min 2X/week   Barriers to discharge Decreased caregiver support      Co-evaluation               End of Session Equipment Utilized During Treatment: Gait belt Activity Tolerance: Patient limited by fatigue Patient left: in bed;with call bell/phone within reach;with bed alarm set Nurse Communication: Mobility status         Time: [id number] PT Time Calculation (min) (ACUTE ONLY): 26 min   Charges:   PT Evaluation $PT Eval Moderate Complexity: 1 Procedure PT  Treatments $Therapeutic Activity: 8-22 mins   PT G CodesIrwin Brakeman F 06/23/15, 11:30 AM Coni Homesley,PT Acute Rehabilitation [phone] (pager)

## 2015-06-18 NOTE — Progress Notes (Signed)
Daily Progress Note   Patient Name: [name] [name]       Date: 06/18/2015 DOB: [birth year]  Age: 82 y.o. MRN#: [id number] Attending Physician: Allie Bossier, MD Primary Care Physician: Vic Blackbird, MD Admit Date: 06/12/2015  Reason for Consultation/Follow-up: Establishing goals of care  Subjective:  Awake sitting up in bed. Interval Events:  Discussed with attending physician Dr. Sherral Hammers. Patient remains with overall lack of significant improvement in the course of this hospitalization thus far. Patient still has nasogastric tube that was being used for correction of hypernatremia as well as artificial nutrition. Speech and swallow evaluation to be repeated. It is not clear if the patient will eat enough to replenish her calories-likely not. Patient will be done with the course of her antibiotics after 2 additional days. It does not appear that the patient will fully participate in a rehabilitation attempt if she were to be sent to a skilled nursing facility. She remains at high risk for clinical decompensation. As far as disposition, it may be prudent to initiate hospice discussions with case worker Cassandra at [phone]. Palliative will follow-up in initiate these discussions by 06-19-15. Discussed with Dr. Sherral Hammers.  Length of Stay: 6 days  Current Medications: Scheduled Meds:  . antiseptic oral rinse  7 mL Mouth Rinse q12n4p  . cefTRIAXone (ROCEPHIN)  IV  1 g Intravenous Q24H  . chlorhexidine  15 mL Mouth Rinse BID  . free water  200 mL Per Tube Q4H  . heparin  5,000 Units Subcutaneous 3 times per day  . metoprolol tartrate  12.5 mg Per Tube BID    Continuous Infusions: . feeding supplement (JEVITY 1.2 CAL) 1,000 mL (06/18/15 1254)  .  sodium bicarbonate  infusion 1000 mL 75  mL/hr at 06/17/15 2037    PRN Meds: sodium chloride, acetaminophen, ondansetron (ZOFRAN) IV  Physical Exam: Physical Exam             Weak elderly lady sitting up in bed is not able to state her name answers a few simple yes/no type questions appropriately. Denies being in pain Shallow breathing S1-S2 Generalized edema Vital Signs: BP 102/54 mmHg  Pulse 101  Temp(Src) 97.5 F (36.4 C) (Axillary)  Resp 28  Ht 5' (1.524 m)  Wt 58 kg (127 lb 13.9 oz)  BMI 24.97 kg/m2  SpO2 96% SpO2: SpO2: 96 % O2 Device: O2 Device: Not Delivered O2 Flow Rate:    Intake/output summary:  Intake/Output Summary (Last 24 hours) at 06/18/15 1604 Last data filed at 06/18/15 1300  Gross per 24 hour  Intake   1660 ml  Output    425 ml  Net   1235 ml   LBM: Last BM Date: 06/18/15 Baseline Weight: Weight: 58.3 kg (128 lb 8.5 oz) Most recent weight: Weight: 58 kg (127 lb 13.9 oz)       Palliative Assessment/Data: Flowsheet Rows        Most Recent Value   Intake Tab    Referral Department  Hospitalist   Unit at Time of Referral  Cardiac/Telemetry Unit   Palliative Care Primary Diagnosis  Sepsis/Infectious Disease   Palliative Care Type  Return patient Palliative Care   Clinical Assessment    Palliative Performance Scale Score  30%   Psychosocial & Spiritual Assessment    Palliative Care Outcomes    Palliative Care follow-up planned  Yes, Facility      Additional Data Reviewed: CBC    Component Value Date/Time   WBC 7.0 06/18/2015 0228   RBC 3.87 06/18/2015 0228   RBC 4.30 01/07/2011 1545   HGB 9.9* 06/18/2015 0228   HCT 32.0* 06/18/2015 0228   PLT 114* 06/18/2015 0228   MCV 82.7 06/18/2015 0228   MCH 25.6* 06/18/2015 0228   MCHC 30.9 06/18/2015 0228   RDW 16.9* 06/18/2015 0228   LYMPHSABS 0.9 06/17/2015 0238   MONOABS 0.7 06/17/2015 0238   EOSABS 0.0 06/17/2015 0238   BASOSABS 0.1 06/17/2015 0238    CMP     Component Value Date/Time   NA 141 06/18/2015 0228   K 3.1*  06/18/2015 0228   CL 106 06/18/2015 0228   CO2 25 06/18/2015 0228   GLUCOSE 136* 06/18/2015 0228   BUN 44* 06/18/2015 0228   CREATININE 1.92* 06/18/2015 0228   CREATININE 1.02* 04/10/2015 1034   CALCIUM 7.6* 06/18/2015 0228   PROT 5.1* 06/17/2015 0238   ALBUMIN 1.9* 06/18/2015 0228   AST 49* 06/17/2015 0238   ALT 24 06/17/2015 0238   ALKPHOS 49 06/17/2015 0238   BILITOT 0.6 06/17/2015 0238   GFRNONAA 22* 06/18/2015 0228   GFRAA 25* 06/18/2015 0228       Problem List:  Patient Active Problem List   Diagnosis Date Noted  . Encounter for palliative care   . Goals of care, counseling/discussion   . Sepsis, unspecified organism ([place])   . Encounter for feeding tube placement   . Acute renal failure ([place])   . Renal failure   . Acute renal failure (ARF) ([place])   . Metabolic acidosis   . Proteus mirabilis infection   . Hyperkalemia   . Hypernatremia   . AKI (acute kidney injury) ([place])   . Dehydration   . Sepsis ([place])   . Diarrhea   . Acute encephalopathy   . Atrial fibrillation ([place])   . Nausea vomiting and diarrhea 06/12/2015  . Candidiasis of skin 03/02/2014  . Corn or callus 03/02/2014  . AVN (avascular necrosis of bone) ([place]) 01/01/2014  . Malnutrition of moderate degree ([place]) 12/21/2013  . Fall 12/20/2013  . Weakness 12/20/2013  . Recurrent UTI 12/20/2013  . A-fib ([place]) 12/20/2013  . Sinus arrhythmia 08/07/2013  . Radicular pain of left lower extremity 08/10/2012  . OA (osteoarthritis) of knee 08/10/2012  . Chronic pain 05/10/2012  . Sciatica 04/09/2011  . Gait instability 02/27/2011  .  Hypertension 12/23/2010  . Anemia of chronic disease 12/23/2010  . Rheumatoid arthritis ([place]) 12/23/2010  . LIVER MASS 03/13/2010  . HIP PAIN, LEFT 08/24/2008  . CKD (chronic kidney disease), stage II 01/20/2007  . Hyperlipidemia 07/19/2006  . DIVERTICULOSIS, COLON 07/19/2006  . CONSTIPATION NOS 07/19/2006  . OSTEOPOROSIS 07/19/2006     Palliative Care Assessment & Plan      1.Code Status:  DNR    Code Status Orders        Start     Ordered   06/12/15 1738  Do not attempt resuscitation (DNR)   Continuous    Question Answer Comment  Maintain current active treatments Yes   Do not initiate new interventions Yes      06/12/15 1737       2. Goals of Care/Additional Recommendations:   Continuing current treatment for now, palliative to follow up on 06-19-15, likely initiate hospice discussions with guardian Cassandra.  Limitations on Scope of Treatment:   Desire for further Chaplaincy support:no  Psycho-social Needs:   3. Symptom Management:      1. As above  4. Palliative Prophylaxis:   Delirium Protocol  5. Prognosis: Weeks  6. Discharge Planning:   Pending further discussions with guardian caseworker Vito Backers at [phone]  Care plan was discussed with Dr Sherral Hammers  Thank you for allowing the Palliative Medicine Team to assist in the care of this patient.   Time In: 1500 Time Out: 1525 Total Time 25 Prolonged Time Billed  no        [id number] Loistine Chance, MD  06/18/2015, 4:04 PM  Please contact Palliative Medicine Team phone at [phone] for questions and concerns.

## 2015-06-18 NOTE — NC FL2 (Signed)
[place] MEDICAID FL2 LEVEL OF CARE SCREENING TOOL     IDENTIFICATION  Patient Name: [name] [name] Birthdate: Jan 23, 1921 Sex: female Admission Date (Current Location): 06/12/2015  [facility] [place] and [place] Number:  [employer] and Address:  [facility] [place], [place] [address], [place], [place] [address]      Provider Number: [id number]  Attending Physician Name and Address:  Allie Bossier, MD  Relative Name and Phone Number:       Current Level of Care: Hospital Recommended Level of Care: [facility] Prior Approval Number:    Date Approved/Denied:   PASRR Number: [id number]  Discharge Plan: SNF    Current Diagnoses: Patient Active Problem List   Diagnosis Date Noted  . Encounter for palliative care   . Goals of care, counseling/discussion   . Sepsis, unspecified organism ([place])   . Encounter for feeding tube placement   . Acute renal failure ([place])   . Renal failure   . Acute renal failure (ARF) ([place])   . Metabolic acidosis   . Proteus mirabilis infection   . Hyperkalemia   . Hypernatremia   . AKI (acute kidney injury) ([place])   . Dehydration   . Sepsis ([place])   . Diarrhea   . Acute encephalopathy   . Atrial fibrillation ([place])   . Nausea vomiting and diarrhea 06/12/2015  . Candidiasis of skin 03/02/2014  . Corn or callus 03/02/2014  . AVN (avascular necrosis of bone) ([place]) 01/01/2014  . Malnutrition of moderate degree ([place]) 12/21/2013  . Fall 12/20/2013  . Weakness 12/20/2013  . Recurrent UTI 12/20/2013  . A-fib ([place]) 12/20/2013  . Sinus arrhythmia 08/07/2013  . Radicular pain of left lower extremity 08/10/2012  . OA (osteoarthritis) of knee 08/10/2012  . Chronic pain 05/10/2012  . Sciatica 04/09/2011  . Gait instability 02/27/2011  . Hypertension 12/23/2010  . Anemia of chronic disease 12/23/2010  . Rheumatoid arthritis ([place]) 12/23/2010  . LIVER MASS 03/13/2010  . HIP PAIN, LEFT 08/24/2008  . CKD (chronic  kidney disease), stage II 01/20/2007  . Hyperlipidemia 07/19/2006  . DIVERTICULOSIS, COLON 07/19/2006  . CONSTIPATION NOS 07/19/2006  . OSTEOPOROSIS 07/19/2006    Orientation RESPIRATION BLADDER Height & Weight    Self  Normal Indwelling catheter 5' (152.4 cm) 127 lbs.  BEHAVIORAL SYMPTOMS/MOOD NEUROLOGICAL BOWEL NUTRITION STATUS      Incontinent (NG tube) Diet, NG/panda (clear liquid)  AMBULATORY STATUS COMMUNICATION OF NEEDS Skin   Extensive Assist Verbally Normal                       Personal Care Assistance Level of Assistance  Total care       Total Care Assistance: Maximum assistance   Functional Limitations Info             SPECIAL CARE FACTORS FREQUENCY  PT (By licensed PT)     PT Frequency: 5/wk              Contractures      Additional Factors Info  Code Status, Allergies Code Status Info: DNR Allergies Info: NKA           Current Medications (06/18/2015):  This is the current hospital active medication list Current Facility-Administered Medications  Medication Dose Route Frequency Provider Last Rate Last Dose  . 0.9 %  sodium chloride infusion  250 mL Intravenous PRN Juanito Doom, MD      . acetaminophen (TYLENOL) tablet 650 mg  650 mg Oral Q4H PRN Juanito Doom, MD   650 mg at 06/12/15 2027  . antiseptic oral rinse (CPC / CETYLPYRIDINIUM CHLORIDE 0.05%) solution 7 mL  7 mL Mouth Rinse q12n4p Allie Bossier, MD   7 mL at 06/18/15 1254  . cefTRIAXone (ROCEPHIN) 1 g in dextrose 5 % 50 mL IVPB  1 g Intravenous Q24H Rachel L Rumbarger, RPH   1 g at 06/18/15 1446  . chlorhexidine (PERIDEX) 0.12 % solution 15 mL  15 mL Mouth Rinse BID Allie Bossier, MD   15 mL at 06/18/15 1011  . feeding supplement (JEVITY 1.2 CAL) liquid 1,000 mL  1,000 mL Per Tube Continuous Rogue Bussing, RD 45 mL/hr at 06/18/15 1254 1,000 mL at 06/18/15 1254  . free water 200 mL  200 mL Per Tube Q4H Cherene Altes, MD   200 mL at 06/18/15 1254  . heparin  injection 5,000 Units  5,000 Units Subcutaneous 3 times per day Juanito Doom, MD   5,000 Units at 06/18/15 1446  . metoprolol tartrate (LOPRESSOR) 25 mg/10 mL oral suspension 12.5 mg  12.5 mg Per Tube BID Cherene Altes, MD   12.5 mg at 06/18/15 1011  . ondansetron (ZOFRAN) injection 4 mg  4 mg Intravenous Q6H PRN Juanito Doom, MD   4 mg at 06/12/15 2027  . sodium bicarbonate 50 mEq in dextrose 5 % 1,000 mL infusion   Intravenous Continuous Jamal Maes, MD 75 mL/hr at 06/17/15 2037       Discharge Medications: Please see discharge summary for a list of discharge medications.  Relevant Imaging Results:  Relevant Lab Results:   Additional Information SS#: [id number], pt has guardian through [place], [place], [place]

## 2015-06-18 NOTE — Progress Notes (Signed)
Speech Language Pathology Treatment: Dysphagia  Patient Details Name: [name] [name] MRN: [id number] DOB: 05-17-1921 Today's Date: 06/18/2015 Time: [id number] SLP Time Calculation (min) (ACUTE ONLY): 18 min  Assessment / Plan / Recommendation Clinical Impression  Pt alert this morning but still has a cognitively-based oral dysphagia with significant oral holding of purees. With Max cues from SLP, pt eventually clears most of the boluses but does need assist with oral suction as she needs Max-Total A for command following. She does have minimal communication today, but her output is much clearer than during this SLP's last visit. With Max cues, she states her last name, responds yes/no to questioning, and picks between binary choices. She clearly stated, "I want applesauce" x1. No overt signs of aspiration noted with any consistencies, but functionally she is not ready for solid POs yet. Recommend to continue with thin liquids, with hopeful advancement as mentation continues to progress.   HPI HPI: 82 yo Female with PMH significant for CKD, iron deficiency anemia and HTN who lives under the care of her nephew who presented to ED on 06/12/2015 with abdominal pain, nausea, vomiting and diarrhea.       SLP Plan  Continue with current plan of care     Recommendations  Diet recommendations: Thin liquid Liquids provided via: Cup;Straw Medication Administration: Crushed with puree Supervision: Staff to assist with self feeding;Full supervision/cueing for compensatory strategies Compensations: Slow rate;Small sips/bites;Other (Comment) (HOH assist wtih feeding to facilitate awareness) Postural Changes and/or Swallow Maneuvers: Seated upright 90 degrees              Oral Care Recommendations: Oral care BID Follow up Recommendations:  (tba) Plan: Continue with current plan of care   Germain Osgood, M.A. CCC-SLP [phone]  Germain Osgood 06/18/2015, 1:00 PM

## 2015-06-18 NOTE — Progress Notes (Addendum)
Nutrition Consult/Follow Up  DOCUMENTATION CODES:   Not applicable  INTERVENTION:    Increase Jevity 1.2 formula to goal rate of 45 ml/hr  TF regimen to provide 1296 kcals, 60 gm protein, 872 ml of free water  NEW NUTRITION DIAGNOSIS:   Inadequate oral intake related to dysphagia, lethargy/confusion as evidenced by meal completion < 25%, ongoing  GOAL:   Patient will meet greater than or equal to 90% of their needs, progressing  MONITOR:   TF tolerance, PO intake, Labs, Weight trends, I & O's  ASSESSMENT:   82 yo Female with PMH significant for CKD, iron deficiency anemia and HTN who lives under the care of her nephew who presented to ED on 06/12/2015 with abdominal pain, nausea, vomiting and diarrhea.   RD consulted for TF management.    CORTRAK small bore feeding tube placed 12/31 -- tip in body of stomach.  S/p bedside swallow evaluation 12/29.  SLP recommending NPO status.  Currently on Clear Liquids with very poor PO intake (5%).  Current TF regimen: Jevity 1.2 formula at 30 ml/hr providing 864 kcals, 40 gm protein, 581 ml of free water.  Free water flushes at 200 ml every 4 hours.  Diet Order:  Diet clear liquid Room service appropriate?: Yes; Fluid consistency:: Thin  Skin:  Reviewed, no issues  Last BM:  1/2  Height:   Ht Readings from Last 1 Encounters:  06/15/15 5' (1.524 m)    Weight:   Wt Readings from Last 1 Encounters:  06/17/15 127 lb 13.9 oz (58 kg)    Ideal Body Weight:  45.4 kg  BMI:  Body mass index is 24.97 kg/(m^2).  Estimated Nutritional Needs:   Kcal:  1200-1400  Protein:  55-65 gm  Fluid:  >/= 1.5 L  EDUCATION NEEDS:   No education needs identified at this time  Arthur Holms, RD, LDN Pager #: [phone] After-Hours Pager #: [phone]

## 2015-06-18 NOTE — Progress Notes (Signed)
Placement process started- per pt guardian agency do NOT discuss placement with pt nephew- guardianship agency will discuss with nephew when pt is closer to [place]  CSW will continue to follow  Domenica Reamer, [place] Social Worker [phone]

## 2015-06-18 NOTE — Clinical Social Work Note (Signed)
Clinical Social Work Assessment  Patient Details  Name: [name] [name] MRN: [id number] Date of Birth: 12-Aug-1920  Date of referral:  06/18/15               Reason for consult:  Facility Placement                Permission sought to share information with:  [place] granted to share information::  Yes, Verbal Permission Granted (pt unable to make decisions/give consent- recieved permission from guardian)  Name::        Agency::  [facility] SN[facility]  Relationship::     Contact Information:     Housing/Transportation Living arrangements for the past 2 months:  Single Family Home Source of Information:  Guardian Patient Interpreter Needed:  None Criminal Activity/Legal Involvement Pertinent to Current Situation/Hospitalization:  No - Comment as needed Significant Relationships:  Other Family Members Lives with:  Other (Comment) (nephew) Do you feel safe going back to the place where you live?  No Need for family participation in patient care:  Yes (Comment) (nephew is usually caregiver at home)  Care giving concerns:  Pt lives at home with nephew, Leanna Sato, pt requiring too much assistance at this time to be safe for return home   Facilities manager / plan:  CSW spoke with pt guardian agency to discuss PT recommendation for SNF. Explained PT eval and recommendation as well as SNF referral process.  Employment status:  Retired Forensic scientist:  Medicare PT Recommendations:  [place] / Referral to community resources:  [place]  Patient/Family's Response to care:  Pt guardian agreeable to SNF placement and would prefer for pt to be placed close to [employer] to be further from pt nephew- guardian expressed concerns that pt nephew would attempt to release pt from SNF (this has happened in the past).  Patient/Family's Understanding of and Emotional Response to Diagnosis, Current  Treatment, and Prognosis:  No questions or concerns at this time.  Emotional Assessment Appearance:  Appears stated age Attitude/Demeanor/Rapport:  Unable to Assess Affect (typically observed):  Unable to Assess Orientation:  Oriented to Self Alcohol / Substance use:  Not Applicable Psych involvement (Current and /or in the community):  No (Comment)  Discharge Needs  Concerns to be addressed:  Home Safety Concerns, Care Coordination Readmission within the last 30 days:  No Current discharge risk:  Physical Impairment, Chronically ill Barriers to Discharge:  Continued Medical Work up   Cranford Mon, LCSW 06/18/2015, 3:49 PM

## 2015-06-19 LAB — GLUCOSE, CAPILLARY
Glucose-Capillary: 127 mg/dL — ABNORMAL HIGH (ref 65–99)
Glucose-Capillary: 127 mg/dL — ABNORMAL HIGH (ref 65–99)
Glucose-Capillary: 132 mg/dL — ABNORMAL HIGH (ref 65–99)
Glucose-Capillary: 150 mg/dL — ABNORMAL HIGH (ref 65–99)
Glucose-Capillary: 97 mg/dL (ref 65–99)

## 2015-06-19 MED ORDER — FREE WATER
200.0000 mL | Freq: Three times a day (TID) | Status: DC
Start: 2015-06-19 — End: 2015-06-21
  Administered 2015-06-19 – 2015-06-21 (×5): 200 mL

## 2015-06-19 MED ORDER — SODIUM CHLORIDE 0.9 % IV SOLN
INTRAVENOUS | Status: DC
  Administered 2015-06-19 – 2015-06-24 (×5): via INTRAVENOUS

## 2015-06-19 NOTE — Progress Notes (Signed)
[name] [name]  [name] [name] [id number] DOB: Aug 23, 1920 DOA: 06/12/2015 PCP: Vic Blackbird, MD  Admit HPI / Brief Narrative: 82 year old F Hx CKD stage I, Fe deficiency Anemia, HTN, HLD, Diverticulosis of colon, and HSV who lives under the care of her nephew who presented to the ED on 06/12/2015 complaining of abdominal pain. Her nephew provided the history as the patient was encephalopathic. He stated that one day prior to admission she was in her usual health feeling well and they ate out the night before. Overnight she had no significant difficulty but early in the morning on 06/12/2015 she started having nausea, vomiting, diarrhea, and cramping abdominal pain.   In the emergency department she was found to be tachycardic and hypotensive w/ a lactic acid greater than 10, and in acute renal failure. She was given IV fluids, antibiotics, a CT scan of her abdomen was performed and pulmonary and critical care medicine was consulted for further evaluation.   HPI/Subjective: The patient is alert and will follow the examiner with her eyes.  She will answer some very simple questions but for the most part does not appear to be oriented to time or situation.  Her mental status appears stable compared to when I saw her 48 hours ago.  She does not appear to be in acute distress.  Assessment/Plan:  Severe Sepsis due to Proteus UTI  Has completed 7 days of abx tx - stop abx today and follow clinically   Aspiration of vomitus? Tolerating current clear liquid diet but cognitive deficit has thus far prevented safe advancement - I am not hopeful that she will make significant improvements in this area and suspect that her inability to nourish herself will prove to be a life limiting factor - SLP continues to follow    Anion gap acute metabolic acidosis - resolved -likely 2/2 starvation ketoacidosis + lactate + diarrhea + AKI - resolved   Newly diagnoses  Atrial fibrillation w/ RVR -HR reasonably controlled - a poor candidate for anticoag given advanced age and frail state/high fall risk   Hypertension  -BP currently controlled   Acute renal failure  -secondary to hypovolemia + ACE + pyelo - crt has improved but has not yet normalized - recheck in AM   Diarrhea -likely due to pyelonephritis - extensive search for GI pathogen fruitless thus far - follow   Hyperkalemia >  Hypokalemia  -Kayexalate treatment > now requiring suplementation - recheck in AM   Hypernatremia -corrected w/ free water   Hypomagnesemia -replace and follow  Toxic metabolic encephalopathy -Minimize sedating medications - mental status w/o signif change over last 48hrs - ?if this is her new baseline - ?cognitive status at baseline   Goals of care -patient has a state appointed guardian who makes medical decisions on her behalf - her family has no legal authority to make medical decisions  -Palliative Care working to establish [place] w/ her guardian   Code Status: DNR Family Communication: no family present at time of exam Disposition Plan: SNF v/s hospice care - GOC being established - stable for transfer to tele bed   Consultants: Nephrology  Palliative Care  PCCM  Antibiotics: 12/28 Zosyn > 12/30  12/30 Ceftriaxone > 1/03  DVT prophylaxis: SQ heparin  Objective: Blood pressure 116/59, pulse 99, temperature 100.6 F (38.1 C), temperature source Oral, resp. rate 29, height 5' (1.524 m), weight 59.1 kg (130 lb 4.7 oz), SpO2 97 %.  Intake/Output Summary (Last 24  hours) at 06/19/15 1139 Last data filed at 06/19/15 1000  Gross per 24 hour  Intake   4205 ml  Output    775 ml  Net   3430 ml   Exam: General: No acute respiratory distress - alert but minimally communicative  Lungs: Clear to auscultation bilaterally  Cardiovascular: Regular rate without murmur gallop or rub - irreg irreg  Abdomen: Nontender, nondistended, soft, bowel sounds positive,  no rebound, no ascites, no appreciable mass Extremities: No significant cyanosis, clubbing, edema bilateral lower extremities  Data Reviewed: Basic Metabolic Panel:  Recent Labs Lab 06/14/15 0449  06/15/15 0432  06/15/15 2219 06/16/15 0420 06/16/15 1100 06/17/15 0238 06/18/15 0228  NA QUANTITY NOT SUFFICIENT, UNABLE TO PERFORM TEST  < > 155*  < > 151* 150* 147* 145 141  K QUANTITY NOT SUFFICIENT, UNABLE TO PERFORM TEST  < > 4.0  < > 3.7 3.4* 3.8 3.1* 3.1*  CL QUANTITY NOT SUFFICIENT, UNABLE TO PERFORM TEST  < > 124*  < > 120* 119* 119* 112* 106  CO2 QUANTITY NOT SUFFICIENT, UNABLE TO PERFORM TEST  < > 16*  < > 18* 20* 17* 22 25  GLUCOSE 77  < > 121*  < > 124* 123* 137* 122* 136*  BUN QUANTITY NOT SUFFICIENT, UNABLE TO PERFORM TEST  < > 84*  < > 79* 76* 73* 57* 44*  CREATININE 4.89*  < > 4.57*  < > 3.68* 3.44* 3.01* 2.32* 1.92*  CALCIUM QUANTITY NOT SUFFICIENT, UNABLE TO PERFORM TEST  < > 7.9*  < > 8.0* 7.9* 7.6* 7.5* 7.6*  MG 2.5*  --  2.6*  --   --  2.1  --  1.8 1.5*  PHOS  --   --   --   --   --   --   --   --  2.5  < > = values in this interval not displayed.  CBC:  Recent Labs Lab 06/14/15 0449 06/14/15 0815 06/15/15 0432 06/16/15 0420 06/17/15 0238 06/18/15 0228  WBC 7.0 6.8 8.3 4.8 5.1 7.0  NEUTROABS 6.3 6.0 7.3 3.7 3.4  --   HGB 11.5* 10.1* 9.7* 9.9* 10.0* 9.9*  HCT 35.2* 32.2* 30.0* 30.6* 31.1* 32.0*  MCV 81.7 82.8 81.3 81.0 82.3 82.7  PLT 105* 137* 140* 112* 86* 114*    Liver Function Tests:  Recent Labs Lab 06/14/15 0449 06/14/15 0815 06/15/15 0432 06/16/15 0420 06/17/15 0238 06/18/15 0228  AST 62* 58* 58* 61* 49*  --   ALT 23 21 23 27 24   --   ALKPHOS 50 49 49 53 49  --   BILITOT 1.0 1.0 0.8 0.5 0.6  --   PROT 5.8* 5.5* 5.2* 5.4* 5.1*  --   ALBUMIN 2.7* 2.4* 2.4* 2.2* 2.1* 1.9*   CBG:  Recent Labs Lab 06/18/15 1719 06/18/15 2042 06/19/15 0018 06/19/15 0357 06/19/15 0740  GLUCAP 128* 115* 150* 127* 132*    Recent Results (from the  past 240 hour(s))  Culture, blood (routine x 2)     Status: None   Collection Time: 06/12/15  4:05 PM  Result Value Ref Range Status   Specimen Description BLOOD LEFT ARM  Final   Special Requests IN PEDIATRIC BOTTLE 2CC  Final   Culture NO GROWTH 5 DAYS  Final   Report Status 06/17/2015 FINAL  Final  Culture, blood (routine x 2)     Status: None   Collection Time: 06/12/15  4:12 PM  Result Value Ref Range Status  Specimen Description BLOOD RIGHT ARM  Final   Special Requests IN PEDIATRIC BOTTLE 2CC  Final   Culture NO GROWTH 5 DAYS  Final   Report Status 06/17/2015 FINAL  Final  Urine culture     Status: None   Collection Time: 06/12/15  5:59 PM  Result Value Ref Range Status   Specimen Description URINE, CATHETERIZED  Final   Special Requests NONE  Final   Culture >=100,000 COLONIES/mL PROTEUS MIRABILIS  Final   Report Status 06/14/2015 FINAL  Final   Organism ID, Bacteria PROTEUS MIRABILIS  Final      Susceptibility   Proteus mirabilis - MIC*    AMPICILLIN <=2 SENSITIVE Sensitive     CEFAZOLIN <=4 SENSITIVE Sensitive     CEFTRIAXONE <=1 SENSITIVE Sensitive     CIPROFLOXACIN <=0.25 SENSITIVE Sensitive     GENTAMICIN <=1 SENSITIVE Sensitive     IMIPENEM <=0.25 SENSITIVE Sensitive     NITROFURANTOIN 256 RESISTANT Resistant     TRIMETH/SULFA <=20 SENSITIVE Sensitive     AMPICILLIN/SULBACTAM <=2 SENSITIVE Sensitive     * >=100,000 COLONIES/mL PROTEUS MIRABILIS  MRSA PCR Screening     Status: None   Collection Time: 06/12/15  7:16 PM  Result Value Ref Range Status   MRSA by PCR NEGATIVE NEGATIVE Final    Comment:        The GeneXpert MRSA Assay (FDA approved for NASAL specimens only), is one component of a comprehensive MRSA colonization surveillance program. It is not intended to diagnose MRSA infection nor to guide or monitor treatment for MRSA infections.   Gastrointestinal Panel by PCR , Stool     Status: None   Collection Time: 06/13/15  3:32 PM  Result Value  Ref Range Status   Campylobacter species NOT DETECTED NOT DETECTED Final   Plesimonas shigelloides NOT DETECTED NOT DETECTED Final   Salmonella species NOT DETECTED NOT DETECTED Final   Yersinia enterocolitica NOT DETECTED NOT DETECTED Final   Vibrio species NOT DETECTED NOT DETECTED Final   Vibrio cholerae NOT DETECTED NOT DETECTED Final   Enteroaggregative E coli (EAEC) NOT DETECTED NOT DETECTED Final   Enteropathogenic E coli (EPEC) NOT DETECTED NOT DETECTED Final   Enterotoxigenic E coli (ETEC) NOT DETECTED NOT DETECTED Final   Shiga like toxin producing E coli (STEC) NOT DETECTED NOT DETECTED Final   E. coli O157 NOT DETECTED NOT DETECTED Final   Shigella/Enteroinvasive E coli (EIEC) NOT DETECTED NOT DETECTED Final   Cryptosporidium NOT DETECTED NOT DETECTED Final   Cyclospora cayetanensis NOT DETECTED NOT DETECTED Final   Entamoeba histolytica NOT DETECTED NOT DETECTED Final   Giardia lamblia NOT DETECTED NOT DETECTED Final   Adenovirus F40/41 NOT DETECTED NOT DETECTED Final   Astrovirus NOT DETECTED NOT DETECTED Final   Norovirus GI/GII NOT DETECTED NOT DETECTED Final   Rotavirus A NOT DETECTED NOT DETECTED Final   Sapovirus (I, II, IV, and V) NOT DETECTED NOT DETECTED Final  C difficile quick scan w PCR reflex     Status: None   Collection Time: 06/16/15 12:44 PM  Result Value Ref Range Status   C Diff antigen NEGATIVE NEGATIVE Final   C Diff toxin NEGATIVE NEGATIVE Final   C Diff interpretation Negative for toxigenic C. difficile  Final     Studies:   Recent x-ray studies have been reviewed in detail by the Attending Physician  Scheduled Meds:  Scheduled Meds: . antiseptic oral rinse  7 mL Mouth Rinse q12n4p  . cefTRIAXone (ROCEPHIN)  IV  1 g Intravenous Q24H  . chlorhexidine  15 mL Mouth Rinse BID  . free water  200 mL Per Tube Q4H  . heparin  5,000 Units Subcutaneous 3 times per day  . metoprolol tartrate  12.5 mg Per Tube BID    Time spent on care of this  patient: 35 mins   Jeremih Dearmas T , MD   [facility] Office  [phone] Pager - Text Page per Shea Evans as per below:  On-Call/Text Page:      [email or web address]      password [email or web address]  If 7PM-7AM, please contact night-coverage [email or web address] Password [email or web address] 06/19/2015, 11:39 AM   LOS: 7 days

## 2015-06-19 NOTE — Progress Notes (Signed)
Daily Progress Note   Patient Name: [name] [name]       Date: 06/19/2015 DOB: Nov 12, 1920  Age: 82 y.o. MRN#: [id number] Attending Physician: Cherene Altes, MD Primary Care Physician: Vic Blackbird, MD Admit Date: 06/12/2015  Reason for Consultation/Follow-up: Establishing goals of care  Subjective:  Awake sitting up in bed.  Remains confused. Interval Events:  Discussed with attending physician Dr. Thereasa Solo. Patient with overall lack of significant improvement in the course of this hospitalization thus far. Patient still has nasogastric tube that was being used for correction of hypernatremia as well as artificial nutrition. Speech and swallow evaluation was repeated and currently on clear liquid diet.   There is major concern with nutrition as it is unlikely the patient will eat enough to replenish her calories.  It does not appear that the patient will fully participate in a rehabilitation attempt if she were to be sent to a skilled nursing facility and she is high risk for clinical decompensation.   I called and spoke with [name] [name], her guardian. She reports being up-to-date on her clinical course and also has good understanding of challenges moving forward with her decreased nutrition and hydration. She agrees that plan moving forward is to avoid long term aggressive artificial nutrition and hydration or placement of PEG tube and to allow [name] to eat and drink as much as she can.  If her nutritional status remains as compromised as it is a currently, plan will be to focus on her comfort and allow her to spend as much time as possible visiting with family.  If this is the case, [name] [name] is in agreement that she will be well served with hospice support.  Length of Stay: 7  days  Current Medications: Scheduled Meds:  . antiseptic oral rinse  7 mL Mouth Rinse q12n4p  . chlorhexidine  15 mL Mouth Rinse BID  . free water  200 mL Per Tube 3 times per day  . heparin  5,000 Units Subcutaneous 3 times per day  . metoprolol tartrate  12.5 mg Per Tube BID    Continuous Infusions: . sodium chloride 30 mL/hr at 06/19/15 1334  . feeding supplement (JEVITY 1.2 CAL) 1,000 mL (06/19/15 0234)    PRN Meds: acetaminophen, ondansetron (ZOFRAN) IV  Physical Exam: Physical Exam  Weak elderly lady sitting up in bed is not able to state her name answers a few simple yes/no type questions appropriately. Denies being in pain Shallow breathing S1-S2 Generalized edema Vital Signs: BP 118/64 mmHg  Pulse 83  Temp(Src) 98.3 F (36.8 C) (Axillary)  Resp 26  Ht 5' (1.524 m)  Wt 59.1 kg (130 lb 4.7 oz)  BMI 25.45 kg/m2  SpO2 98% SpO2: SpO2: 98 % O2 Device: O2 Device: Not Delivered O2 Flow Rate:    Intake/output summary:   Intake/Output Summary (Last 24 hours) at 06/19/15 1335 Last data filed at 06/19/15 1253  Gross per 24 hour  Intake   3755 ml  Output    900 ml  Net   2855 ml   LBM: Last BM Date: 06/18/15 Baseline Weight: Weight: 58.3 kg (128 lb 8.5 oz) Most recent weight: Weight: 59.1 kg (130 lb 4.7 oz)       Palliative Assessment/Data: Flowsheet Rows        Most Recent Value   Intake Tab    Referral Department  Hospitalist   Unit at Time of Referral  Cardiac/Telemetry Unit   Palliative Care Primary Diagnosis  Sepsis/Infectious Disease   Palliative Care Type  Return patient Palliative Care   Clinical Assessment    Palliative Performance Scale Score  30%   Psychosocial & Spiritual Assessment    Palliative Care Outcomes    Palliative Care follow-up planned  Yes, Facility      Additional Data Reviewed: CBC    Component Value Date/Time   WBC 7.0 06/18/2015 0228   RBC 3.87 06/18/2015 0228   RBC 4.30 01/07/2011 1545   HGB 9.9* 06/18/2015  0228   HCT 32.0* 06/18/2015 0228   PLT 114* 06/18/2015 0228   MCV 82.7 06/18/2015 0228   MCH 25.6* 06/18/2015 0228   MCHC 30.9 06/18/2015 0228   RDW 16.9* 06/18/2015 0228   LYMPHSABS 0.9 06/17/2015 0238   MONOABS 0.7 06/17/2015 0238   EOSABS 0.0 06/17/2015 0238   BASOSABS 0.1 06/17/2015 0238    CMP     Component Value Date/Time   NA 141 06/18/2015 0228   K 3.1* 06/18/2015 0228   CL 106 06/18/2015 0228   CO2 25 06/18/2015 0228   GLUCOSE 136* 06/18/2015 0228   BUN 44* 06/18/2015 0228   CREATININE 1.92* 06/18/2015 0228   CREATININE 1.02* 04/10/2015 1034   CALCIUM 7.6* 06/18/2015 0228   PROT 5.1* 06/17/2015 0238   ALBUMIN 1.9* 06/18/2015 0228   AST 49* 06/17/2015 0238   ALT 24 06/17/2015 0238   ALKPHOS 49 06/17/2015 0238   BILITOT 0.6 06/17/2015 0238   GFRNONAA 22* 06/18/2015 0228   GFRAA 25* 06/18/2015 0228       Problem List:  Patient Active Problem List   Diagnosis Date Noted  . Aspiration of vomitus   . Essential hypertension   . Hypokalemia   . Encounter for palliative care   . Goals of care, counseling/discussion   . Sepsis, unspecified organism ([place])   . Encounter for feeding tube placement   . Acute renal failure ([place])   . Renal failure   . Acute renal failure (ARF) ([place])   . Metabolic acidosis   . Proteus mirabilis infection   . Hyperkalemia   . Hypernatremia   . AKI (acute kidney injury) ([place])   . Dehydration   . Sepsis ([place])   . Diarrhea   . Acute encephalopathy   . Atrial fibrillation ([place])   . Nausea vomiting and diarrhea  06/12/2015  . Candidiasis of skin 03/02/2014  . Corn or callus 03/02/2014  . AVN (avascular necrosis of bone) ([place]) 01/01/2014  . Malnutrition of moderate degree ([place]) 12/21/2013  . Fall 12/20/2013  . Weakness 12/20/2013  . Recurrent UTI 12/20/2013  . A-fib ([place]) 12/20/2013  . Sinus arrhythmia 08/07/2013  . Radicular pain of left lower extremity 08/10/2012  . OA (osteoarthritis) of knee 08/10/2012  . Chronic pain  05/10/2012  . Sciatica 04/09/2011  . Gait instability 02/27/2011  . Hypertension 12/23/2010  . Anemia of chronic disease 12/23/2010  . Rheumatoid arthritis ([place]) 12/23/2010  . LIVER MASS 03/13/2010  . HIP PAIN, LEFT 08/24/2008  . CKD (chronic kidney disease), stage II 01/20/2007  . Hyperlipidemia 07/19/2006  . DIVERTICULOSIS, COLON 07/19/2006  . CONSTIPATION NOS 07/19/2006  . OSTEOPOROSIS 07/19/2006     Palliative Care Assessment & Plan    1.Code Status:  DNR    Code Status Orders        Start     Ordered   06/12/15 1738  Do not attempt resuscitation (DNR)   Continuous    Question Answer Comment  Maintain current active treatments Yes   Do not initiate new interventions Yes      06/12/15 1737       2. Goals of Care/Additional Recommendations:  Spoke with guardian, [name] [name].  Plan to continue to work to advance diet.  No plans for PEG or long term artificial nutrition and hydration.  If her intake progresses, consider SNF placement.  More likely, however, is continued poor nutrition and hydration.  If this is the case, plan to ensure comfort and allow patient to visit with her family.  She would benefit from hospice support and her guardian is agreeable to enrollment in hospice.  Worthy Keeler, also expressed concern that her nephew has removed her from SNF in the past without guardian's consent.  Will need to continue to ensure that staff aware that nephew is not decision maker and patient is not to be allowed to leave with him.    Desire for further Chaplaincy support:no  3. Symptom Management:      1. As above  4. Palliative Prophylaxis:   Delirium Protocol  5. Prognosis: Weeks  6. Discharge Planning: Pending determination of her clinical course and further discussions with guardian caseworker Blair Dolphin at [phone].  I believe it is most likely that she will discharge to skilled facility with hospice support vs residential hospice.  Care plan was  discussed with Dr Thereasa Solo  Thank you for allowing the Palliative Medicine Team to assist in the care of this patient.   Time In: 1500 Time Out: 1525 Total Time 25 Prolonged Time Billed  no        [id number] Micheline Rough, MD  06/19/2015, 1:35 PM  Please contact Palliative Medicine Team phone at [phone] for questions and concerns.

## 2015-06-20 DIAGNOSIS — I4891 Unspecified atrial fibrillation: Secondary | ICD-10-CM

## 2015-06-20 DIAGNOSIS — G9340 Encephalopathy, unspecified: Secondary | ICD-10-CM

## 2015-06-20 DIAGNOSIS — E876 Hypokalemia: Secondary | ICD-10-CM

## 2015-06-20 DIAGNOSIS — E860 Dehydration: Secondary | ICD-10-CM

## 2015-06-20 DIAGNOSIS — L8990 Pressure ulcer of unspecified site, unspecified stage: Secondary | ICD-10-CM | POA: Insufficient documentation

## 2015-06-20 LAB — GLUCOSE, CAPILLARY
GLUCOSE-CAPILLARY: 101 mg/dL — AB (ref 65–99)
GLUCOSE-CAPILLARY: 107 mg/dL — AB (ref 65–99)
GLUCOSE-CAPILLARY: 109 mg/dL — AB (ref 65–99)
GLUCOSE-CAPILLARY: 111 mg/dL — AB (ref 65–99)
GLUCOSE-CAPILLARY: 87 mg/dL (ref 65–99)
Glucose-Capillary: 107 mg/dL — ABNORMAL HIGH (ref 65–99)
Glucose-Capillary: 111 mg/dL — ABNORMAL HIGH (ref 65–99)
Glucose-Capillary: 120 mg/dL — ABNORMAL HIGH (ref 65–99)

## 2015-06-20 LAB — COMPREHENSIVE METABOLIC PANEL
ALBUMIN: 1.6 g/dL — AB (ref 3.5–5.0)
ALK PHOS: 72 U/L (ref 38–126)
ALT: 37 U/L (ref 14–54)
AST: 56 U/L — AB (ref 15–41)
Anion gap: 9 (ref 5–15)
BUN: 42 mg/dL — ABNORMAL HIGH (ref 6–20)
CALCIUM: 7.6 mg/dL — AB (ref 8.9–10.3)
CHLORIDE: 105 mmol/L (ref 101–111)
CO2: 24 mmol/L (ref 22–32)
CREATININE: 1.75 mg/dL — AB (ref 0.44–1.00)
GFR calc non Af Amer: 24 mL/min — ABNORMAL LOW (ref >60)
GFR, EST AFRICAN AMERICAN: 28 mL/min — AB (ref >60)
GLUCOSE: 106 mg/dL — AB (ref 65–99)
Potassium: 3.7 mmol/L (ref 3.5–5.1)
SODIUM: 138 mmol/L (ref 135–145)
Total Bilirubin: 0.3 mg/dL (ref 0.3–1.2)
Total Protein: 4.7 g/dL — ABNORMAL LOW (ref 6.5–8.1)

## 2015-06-20 LAB — CBC
HCT: 27 % — ABNORMAL LOW (ref 36.0–46.0)
Hemoglobin: 8.3 g/dL — ABNORMAL LOW (ref 12.0–15.0)
MCH: 25.9 pg — AB (ref 26.0–34.0)
MCHC: 30.7 g/dL (ref 30.0–36.0)
MCV: 84.4 fL (ref 78.0–100.0)
PLATELETS: 152 10*3/uL (ref 150–400)
RBC: 3.2 MIL/uL — AB (ref 3.87–5.11)
RDW: 17 % — ABNORMAL HIGH (ref 11.5–15.5)
WBC: 5.3 10*3/uL (ref 4.0–10.5)

## 2015-06-20 LAB — MAGNESIUM: Magnesium: 2.2 mg/dL (ref 1.7–2.4)

## 2015-06-20 NOTE — Progress Notes (Signed)
Physical Therapy Treatment Patient Details Name: [name] [name] MRN: [id number] DOB: 02/05/1921 Today's Date: 06/20/2015    History of Present Illness 82 year old F Hx CKD stage I, Fe deficiency Anemia, HTN, HLD, Diverticulosis of colon, and HSV who lives under the care of her nephew who presented to the ED on 06/12/2015 complaining of abdominal pain. Her nephew provided the history as the patient was encephalopathic. He stated that one day prior to admission she was in her usual health feeling well and they ate out the night before. Overnight she had no significant difficulty but early in the morning on 06/12/2015 she started having nausea, vomiting, diarrhea, and cramping abdominal pain.     PT Comments    Minimal progress today. Pt intermittently following simple, single step commands. Needs hand over hand guidance with each task for bed mobility and sit<>stand transfer training Max assist +2. Improved seated balance but stand fully upright. Patient will continue to benefit from skilled physical therapy services to further improve independence with functional mobility.   Follow Up Recommendations  SNF;Supervision/Assistance - 24 hour     Equipment Recommendations  Other (comment) (TBA)    Recommendations for Other Services       Precautions / Restrictions Precautions Precautions: Fall Restrictions Weight Bearing Restrictions: No    Mobility  Bed Mobility Overal bed mobility: Needs Assistance;+2 for physical assistance Bed Mobility: Rolling;Supine to Sit;Sit to Sidelying Rolling: Max assist   Supine to sit: +2 for physical assistance;Max assist   Sit to sidelying: Max assist;+2 for physical assistance General bed mobility comments: Max assist for all bed mobility today. Difficulty following commands. Able to reach for rails at times when cued during rolling and to lie back in bed. +2 assist for truncal and LE support with transitions to/from EOB.  Transfers Overall  transfer level: Needs assistance Equipment used: 2 person hand held assist Transfers: Sit to/from Stand Sit to Stand: +2 physical assistance;Max assist         General transfer comment: Max assist +2 for boost to stand from bed x3. Tactile cues for upright posture. Pt with active knee extension however minimal ability to stand and extend hips for upright posture. Bed pad used to assist with lift under hips.  Ambulation/Gait                 Stairs            Wheelchair Mobility    Modified Rankin (Stroke Patients Only)       Balance Overall balance assessment: Needs assistance Sitting-balance support: No upper extremity supported;Feet supported Sitting balance-Leahy Scale: Fair Sitting balance - Comments: Able to tolerate leaning left and right with recovery to midline. Some resistance when attempting to lie back into bed. No anterior or posterior loss of balance while sitting EOB today. Tolerated several minutes unsupported seated between each stand.                            Cognition Arousal/Alertness: Awake/alert Behavior During Therapy: Flat affect Overall Cognitive Status: History of cognitive impairments - at baseline                 General Comments: Intermittently following one step commands with increased time.    Exercises      General Comments General comments (skin integrity, edema, etc.): Small bowel movement after attempting transfer training. Pericare performed, sheets replaced. RN requested patient not be placed in chair.  Pertinent Vitals/Pain Pain Assessment: No/denies pain    Home Living                      Prior Function            PT Goals (current goals can now be found in the care plan section) Acute Rehab PT Goals Patient Stated Goal: unable to state PT Goal Formulation: Patient unable to participate in goal setting Time For Goal Achievement: 07/02/15 Potential to Achieve Goals:  Fair Progress towards PT goals: Progressing toward goals    Frequency  Min 2X/week    PT Plan Current plan remains appropriate    Co-evaluation             End of Session Equipment Utilized During Treatment: Gait belt Activity Tolerance: Patient limited by fatigue;Other (comment) (difficulty following commands) Patient left: in bed;with call bell/phone within reach;with bed alarm set (Hand mittens replaced)     Time: [id number] PT Time Calculation (min) (ACUTE ONLY): 24 min  Charges:  $Therapeutic Activity: 23-37 mins                    G Codes:      Ellouise Newer Jul 01, 2015, 3:55 PM  Camille Bal [place], [place]

## 2015-06-20 NOTE — Progress Notes (Signed)
PROGRESS NOTE  [name] [name] [id number] DOB: 02/06/1921 DOA: 06/12/2015 PCP: Vic Blackbird, MD  Admit HPI / Brief Narrative: 82 year old F Hx CKD stage I, Fe deficiency Anemia, HTN, HLD, Diverticulosis of colon, and HSV who lives under the care of her nephew who presented to the ED on 06/12/2015 complaining of abdominal pain. Her nephew provided the history as the patient was encephalopathic. He stated that one day prior to admission she was in her usual health feeling well and they ate out the night before. Overnight she had no significant difficulty but early in the morning on 06/12/2015 she started having nausea, vomiting, diarrhea, and cramping abdominal pain.   In the emergency department she was found to be tachycardic and hypotensive w/ a lactic acid greater than 10, and in acute renal failure. She was given IV fluids, antibiotics, a CT scan of her abdomen was performed and pulmonary and critical care medicine was consulted for further evaluation.   HPI/Subjective: The patient is alert and will follow the examiner with her eyes. She will answer some very simple questions. Denies any headache, chest pain, shortness breath, abdominal pain. States that she feels hungry. No reports of this. Distress, vomiting, diarrhea. Had 1 bowel movement today.  Assessment/Plan:  Severe Sepsis due to Proteus UTI  Has completed 7 days of abx tx - stop abx today and follow clinically  -Ceftriaxone last dose 06/18/2015 -Monitor off antibiotics -BMP and CBC in the morning -Low-grade temp in the morning on 06/19/2015--none since then  Dysphagia Tolerating current clear liquid diet but cognitive deficit has thus far prevented safe advancement  -currently with PANDA tube tolerating enteral feeds -ask speech therapy for repeat eval now that pt mental status is likely near her baseline -if able to start dysphagia diet-->plan to d/c PANDA tube and allow po -if unable to start  diet-->will need to discuss with guardian about comfort care and comfort feeds--G-tube/artificial nutrition long term is not an option  Anion gap acute metabolic acidosis - resolved -likely 2/2 starvation ketoacidosis + lactate + diarrhea + AKI - resolved   Newly diagnoses Atrial fibrillation w/ RVR -HR reasonably controlled - a poor candidate for anticoag given advanced age and frail state/high fall risk  -Metoprolol tartrate  Hypertension  -BP currently controlled   Acute renal failure  -secondary to hypovolemia + ACE + pyelo - crt has improved but has not yet normalized - recheck in AM  -Baseline creatinine 0.8-1.0  Diarrhea -c.diff and stool pathogen panel neg -seems to have improved -only one BM on 06/20/15  Hyperkalemia > Hypokalemia  -Kayexalate treatment > now requiring suplementation - recheck in AM   Hypernatremia -corrected w/ free water   Hypomagnesemia -replace and follow  Toxic metabolic encephalopathy -Minimize sedating medications - mental status w/o signif change over last 48hrs - ?if this is her new baseline - ?cognitive status at baseline   Goals of care -patient has a state appointed guardian who makes medical decisions on her behalf - her family has no legal authority to make medical decisions  -Palliative Care working to establish [place] w/ her guardian  -06/20/15--case discussed with Dr. Domingo Cocking  Code Status: DNR Family Communication: no family present at time of exam Disposition Plan: SNF v/s hospice care -  Consultants: Nephrology  Palliative Care  PCCM  Antibiotics: 12/28 Zosyn > 12/30  12/30 Ceftriaxone > 1/03  DVT prophylaxis: SQ heparin   Procedures/Studies: Ct Abdomen Pelvis Wo Contrast  06/12/2015  CLINICAL DATA:  Abdominal pain, diarrhea, elevated lactate greater than 10, history diverticulosis of the colon, GERD, hypertension EXAM: CT ABDOMEN AND PELVIS WITHOUT CONTRAST TECHNIQUE: Multidetector CT imaging of the abdomen and  pelvis was performed following the standard protocol without IV contrast. Sagittal and coronal MPR images reconstructed from axial data set. Oral contrast was not administered. COMPARISON:  04/16/2010 FINDINGS: Emphysematous changes at lung bases with minimal bibasilar atelectasis. Eventration RIGHT diaphragm containing liver and RIGHT kidney again noted. Extensive atherosclerotic calcifications aorta and coronary arteries. Large cavernous hemangioma seen at the RIGHT lobe of liver on the previous study is not well visualized on the current exam; hepatic contour appears concave at this site, question volume loss/contraction of this lesion since previous study, potentially measuring 2.0 x 1.8 cm image 18. Low-attenuation lesion centrally within spleen, slightly bilobed appearance, 2.8 x 1.7 cm image 7, not seen on prior noncontrast study but on earlier CT with contrast from 11/30/2008, multiple splenic hemangiomas were identified, 2 of which were located at this position now appear confluent. Gallbladder distended. LEFT renal cyst 1.6 x 1.6 cm image 14. Remainder of liver, spleen, pancreas, kidneys, and LEFT adrenal gland normal. Superior pole of the RIGHT kidney, superior aspect of liver and RIGHT adrenal gland are within the eventration and above imaged field. Hiatal hernia, moderate-sized. Enlargement of cardiac chambers. Increased stool and gas within rectum. Suboptimal assessment of GI tract due to lack of IV and oral contrast and inadequate distention. Suspicion of rectal wall thickening question proctitis. Bowel loops otherwise grossly unremarkable within limitations of exam. No definite free intraperitoneal air or fluid. Intra-abdominal fat and mesenteric fat planes are less distinct than on the previous exam though uncertain if this is due to hypoproteinemia or mild scattered edema Question RIGHT ovarian cyst 2.4 x 3.0 cm image 43. Bladder decompressed. Severe osseous demineralization with advanced  degenerative changes of the LEFT hip joint with acetabulum protrusio. Marked compression deformity of L3 vertebral body. IMPRESSION: Interval partial regression of hepatic hemangioma at RIGHT lobe liver. Slight increase in size of previously identified splenic hemangiomas. Question rectal wall thickening/proctitis, recommend correlation with proctoscopy. Extensive atherosclerotic disease. Question RIGHT ovarian cyst. No other definite intra-abdominal or intrapelvic abnormalities identified on exam limited by a lack of IV and oral contrast. Electronically Signed   By: Lavonia Dana M.D.   On: 06/12/2015 17:05   US Renal Port  06/14/2015  CLINICAL DATA:  82 year old female with history of acute renal failure. EXAM: RENAL / URINARY TRACT ULTRASOUND COMPLETE COMPARISON:  No priors. FINDINGS: Right Kidney: Length: 7.8 cm. Diffusely increased echogenicity, with areas of mild multifocal cortical thinning. No mass or hydronephrosis visualized. Left Kidney: Length: 9.7 cm. Diffusely increased echogenicity, with areas of mild multifocal cortical thinning. Tiny lesion in the lower pole measuring 1.1 x 1.1 x 1.2 cm which is hypoechoic with increased through transmission, compatible with a small cyst. No hydronephrosis visualized. Bladder: Appears normal for degree of bladder distention. IMPRESSION: 1. No hydronephrosis or other findings to account for the patient's acute renal failure. 2. Mild diffuse increased cortical echogenicity suggesting medical renal disease. Multifocal mild cortical scarring. 3. Small cysts in the lower pole of the left kidney incidentally noted. Electronically Signed   By: Vinnie Langton M.D.   On: 06/14/2015 22:24   Dg Chest Port 1 View  06/14/2015  CLINICAL DATA:  Aspiration pneumonia. EXAM: PORTABLE CHEST 1 VIEW COMPARISON:  06/12/2015 and chest CT dated 07/12/2009 FINDINGS: The patient has a new moderate left pleural effusion.  There is a chronic posterior right diaphragmatic hernia  containing a portion of the liver and the right kidney and the right adrenal gland. Heart size is normal. There is tortuosity and calcification of the thoracic aorta. Soft tissue density at the right apex medially represents tortuous brachiocephalic vessels. No acute osseous abnormality. Degenerative changes at the shoulders and at the thoracolumbar junction of the spine. IMPRESSION: New moderate left effusion.  Aortic atherosclerosis. Electronically Signed   By: Lorriane Shire M.D.   On: 06/14/2015 14:36   Dg Chest Portable 1 View  06/12/2015  CLINICAL DATA:  Altered mental status. Abdominal pain this morning with vomiting and diarrhea. EXAM: PORTABLE CHEST 1 VIEW COMPARISON:  Chest x-ray dated 12/20/2013 FINDINGS: Heart size and pulmonary vascularity are normal. Extensive calcification in the thoracic aorta. The lungs are clear. Diffuse osteopenia. No acute osseous abnormality. IMPRESSION: No acute abnormalities.  Aortic atherosclerosis. Electronically Signed   By: Lorriane Shire M.D.   On: 06/12/2015 16:32   Dg Abd Portable 1v  06/15/2015  CLINICAL DATA:  NG/feeding tube placement. EXAM: PORTABLE ABDOMEN - 1 VIEW COMPARISON:  None. FINDINGS: A NG/feeding tube is identified with tip overlying the proximal -mid stomach. The bowel gas pattern is unchanged. IMPRESSION: NG/feeding tube with tip overlying the proximal -mid stomach. Electronically Signed   By: Margarette Canada M.D.   On: 06/15/2015 19:46   Dg Abd Portable 1v  06/15/2015  CLINICAL DATA:  82 year old female with history of feeding tube placement. EXAM: PORTABLE ABDOMEN - 1 VIEW COMPARISON:  Abdominal radiograph 03/27/2011. FINDINGS: Feeding tube noted with tip projecting over the body of the stomach. Gas and stool are noted throughout the colon extending to the distal rectum. Several nondilated gas-filled loops of small bowel are noted. No pathologic dilatation of small bowel. No gross evidence of pneumoperitoneum on this single supine view of the  abdomen. Extensive atherosclerosis throughout the visualized vasculature. IMPRESSION: 1. Tip of feeding tube is in the body of the stomach. Electronically Signed   By: Vinnie Langton M.D.   On: 06/15/2015 18:23         Objective: Filed Vitals:   06/19/15 2152 06/20/15 0541 06/20/15 0850 06/20/15 1527  BP: 110/60 138/55 122/44 122/54  Pulse: 97 89 80 80  Temp: 98.5 F (36.9 C) 98 F (36.7 C) 98.2 F (36.8 C) 98.5 F (36.9 C)  TempSrc: Axillary Axillary Oral Axillary  Resp: 20 20 18 17   Height:      Weight: 58 kg (127 lb 13.9 oz)     SpO2: 97% 97% 97% 99%    Intake/Output Summary (Last 24 hours) at 06/20/15 1752 Last data filed at 06/20/15 1736  Gross per 24 hour  Intake      0 ml  Output   1600 ml  Net  -1600 ml   Weight change: -1.1 kg (-2 lb 6.8 oz) Exam:   General:  Pt is alert, follows commands appropriately, not in acute distress  HEENT: No icterus, No thrush, No neck mass, [place]/AT  Cardiovascular: RRR, S1/S2, no rubs, no gallops  Respiratory: CTA bilaterally, no wheezing, no crackles, no rhonchi  Abdomen: Soft/+BS, non tender, non distended, no guarding, no HSM  Extremities: 1+LE edema, No lymphangitis, No petechiae, No rashes, no synovitis  Data Reviewed: Basic Metabolic Panel:  Recent Labs Lab 06/15/15 0432  06/16/15 0420 06/16/15 1100 06/17/15 0238 06/18/15 0228 06/20/15 0449  NA 155*  < > 150* 147* 145 141 138  K 4.0  < > 3.4* 3.8  3.1* 3.1* 3.7  CL 124*  < > 119* 119* 112* 106 105  CO2 16*  < > 20* 17* 22 25 24   GLUCOSE 121*  < > 123* 137* 122* 136* 106*  BUN 84*  < > 76* 73* 57* 44* 42*  CREATININE 4.57*  < > 3.44* 3.01* 2.32* 1.92* 1.75*  CALCIUM 7.9*  < > 7.9* 7.6* 7.5* 7.6* 7.6*  MG 2.6*  --  2.1  --  1.8 1.5* 2.2  PHOS  --   --   --   --   --  2.5  --   < > = values in this interval not displayed. Liver Function Tests:  Recent Labs Lab 06/14/15 0815 06/15/15 0432 06/16/15 0420 06/17/15 0238 06/18/15 0228 06/20/15 0449  AST  58* 58* 61* 49*  --  56*  ALT 21 23 27 24   --  37  ALKPHOS 49 49 53 49  --  72  BILITOT 1.0 0.8 0.5 0.6  --  0.3  PROT 5.5* 5.2* 5.4* 5.1*  --  4.7*  ALBUMIN 2.4* 2.4* 2.2* 2.1* 1.9* 1.6*   No results for input(s): LIPASE, AMYLASE in the last 168 hours. No results for input(s): AMMONIA in the last 168 hours. CBC:  Recent Labs Lab 06/14/15 0449 06/14/15 0815 06/15/15 0432 06/16/15 0420 06/17/15 0238 06/18/15 0228 06/20/15 0449  WBC 7.0 6.8 8.3 4.8 5.1 7.0 5.3  NEUTROABS 6.3 6.0 7.3 3.7 3.4  --   --   HGB 11.5* 10.1* 9.7* 9.9* 10.0* 9.9* 8.3*  HCT 35.2* 32.2* 30.0* 30.6* 31.1* 32.0* 27.0*  MCV 81.7 82.8 81.3 81.0 82.3 82.7 84.4  PLT 105* 137* 140* 112* 86* 114* 152   Cardiac Enzymes: No results for input(s): CKTOTAL, CKMB, CKMBINDEX, TROPONINI in the last 168 hours. BNP: Invalid input(s): POCBNP CBG:  Recent Labs Lab 06/20/15 0003 06/20/15 0400 06/20/15 0726 06/20/15 1134 06/20/15 1622  GLUCAP 107* 101* 87 120* 111*    Recent Results (from the past 240 hour(s))  Culture, blood (routine x 2)     Status: None   Collection Time: 06/12/15  4:05 PM  Result Value Ref Range Status   Specimen Description BLOOD LEFT ARM  Final   Special Requests IN PEDIATRIC BOTTLE 2CC  Final   Culture NO GROWTH 5 DAYS  Final   Report Status 06/17/2015 FINAL  Final  Culture, blood (routine x 2)     Status: None   Collection Time: 06/12/15  4:12 PM  Result Value Ref Range Status   Specimen Description BLOOD RIGHT ARM  Final   Special Requests IN PEDIATRIC BOTTLE 2CC  Final   Culture NO GROWTH 5 DAYS  Final   Report Status 06/17/2015 FINAL  Final  Urine culture     Status: None   Collection Time: 06/12/15  5:59 PM  Result Value Ref Range Status   Specimen Description URINE, CATHETERIZED  Final   Special Requests NONE  Final   Culture >=100,000 COLONIES/mL PROTEUS MIRABILIS  Final   Report Status 06/14/2015 FINAL  Final   Organism ID, Bacteria PROTEUS MIRABILIS  Final       Susceptibility   Proteus mirabilis - MIC*    AMPICILLIN <=2 SENSITIVE Sensitive     CEFAZOLIN <=4 SENSITIVE Sensitive     CEFTRIAXONE <=1 SENSITIVE Sensitive     CIPROFLOXACIN <=0.25 SENSITIVE Sensitive     GENTAMICIN <=1 SENSITIVE Sensitive     IMIPENEM <=0.25 SENSITIVE Sensitive     NITROFURANTOIN 256 RESISTANT Resistant  TRIMETH/SULFA <=20 SENSITIVE Sensitive     AMPICILLIN/SULBACTAM <=2 SENSITIVE Sensitive     * >=100,000 COLONIES/mL PROTEUS MIRABILIS  MRSA PCR Screening     Status: None   Collection Time: 06/12/15  7:16 PM  Result Value Ref Range Status   MRSA by PCR NEGATIVE NEGATIVE Final    Comment:        The GeneXpert MRSA Assay (FDA approved for NASAL specimens only), is one component of a comprehensive MRSA colonization surveillance program. It is not intended to diagnose MRSA infection nor to guide or monitor treatment for MRSA infections.   Gastrointestinal Panel by PCR , Stool     Status: None   Collection Time: 06/13/15  3:32 PM  Result Value Ref Range Status   Campylobacter species NOT DETECTED NOT DETECTED Final   Plesimonas shigelloides NOT DETECTED NOT DETECTED Final   Salmonella species NOT DETECTED NOT DETECTED Final   Yersinia enterocolitica NOT DETECTED NOT DETECTED Final   Vibrio species NOT DETECTED NOT DETECTED Final   Vibrio cholerae NOT DETECTED NOT DETECTED Final   Enteroaggregative E coli (EAEC) NOT DETECTED NOT DETECTED Final   Enteropathogenic E coli (EPEC) NOT DETECTED NOT DETECTED Final   Enterotoxigenic E coli (ETEC) NOT DETECTED NOT DETECTED Final   Shiga like toxin producing E coli (STEC) NOT DETECTED NOT DETECTED Final   E. coli O157 NOT DETECTED NOT DETECTED Final   Shigella/Enteroinvasive E coli (EIEC) NOT DETECTED NOT DETECTED Final   Cryptosporidium NOT DETECTED NOT DETECTED Final   Cyclospora cayetanensis NOT DETECTED NOT DETECTED Final   Entamoeba histolytica NOT DETECTED NOT DETECTED Final   Giardia lamblia NOT DETECTED NOT  DETECTED Final   Adenovirus F40/41 NOT DETECTED NOT DETECTED Final   Astrovirus NOT DETECTED NOT DETECTED Final   Norovirus GI/GII NOT DETECTED NOT DETECTED Final   Rotavirus A NOT DETECTED NOT DETECTED Final   Sapovirus (I, II, IV, and V) NOT DETECTED NOT DETECTED Final  C difficile quick scan w PCR reflex     Status: None   Collection Time: 06/16/15 12:44 PM  Result Value Ref Range Status   C Diff antigen NEGATIVE NEGATIVE Final   C Diff toxin NEGATIVE NEGATIVE Final   C Diff interpretation Negative for toxigenic C. difficile  Final     Scheduled Meds: . antiseptic oral rinse  7 mL Mouth Rinse q12n4p  . chlorhexidine  15 mL Mouth Rinse BID  . free water  200 mL Per Tube 3 times per day  . heparin  5,000 Units Subcutaneous 3 times per day  . metoprolol tartrate  12.5 mg Per Tube BID   Continuous Infusions: . sodium chloride 30 mL/hr at 06/20/15 0708  . feeding supplement (JEVITY 1.2 CAL) 1,000 mL (06/19/15 0234)     Miryam Mcelhinney, DO  [facility] Pager [phone]  If 7PM-7AM, please contact night-coverage [email or web address] Password [email or web address] 06/20/2015, 5:52 PM   LOS: 8 days

## 2015-06-20 NOTE — Progress Notes (Signed)
[facility] is able to accept pt at time of DC with or without palliative services- this was one of preferred facilities by guardianship agency and they are agreeable to pt transfer there.  Pt transferred to [facility]- report given to unit CSW who will continue to follow for pt needs  Domenica Reamer, [place] Social Worker [phone]

## 2015-06-20 NOTE — Progress Notes (Signed)
Daily Progress Note   Patient Name: [name] [name]       Date: 06/20/2015 DOB: 11/23/1920  Age: 82 y.o. MRN#: [id number] Attending Physician: Orson Eva, MD Primary Care Physician: Vic Blackbird, MD Admit Date: 06/12/2015  Reason for Consultation/Follow-up: Establishing goals of care  Subjective:  Awake sitting up in bed.  Remains confused and sleepy. Interval Events:  Discussed with attending physician Dr. Carles Collet. Patient with overall lack of significant improvement in the course of this hospitalization thus far. Patient still has nasogastric tube that was being used for correction of hypernatremia as well as artificial nutrition. Plan for re-eval by SLP.  I called and discussed with [name] [name] from her guardian service as outlined below.   Length of Stay: 8 days  Current Medications: Scheduled Meds:  . antiseptic oral rinse  7 mL Mouth Rinse q12n4p  . chlorhexidine  15 mL Mouth Rinse BID  . free water  200 mL Per Tube 3 times per day  . heparin  5,000 Units Subcutaneous 3 times per day  . metoprolol tartrate  12.5 mg Per Tube BID    Continuous Infusions: . sodium chloride 30 mL/hr at 06/20/15 0708  . feeding supplement (JEVITY 1.2 CAL) 1,000 mL (06/19/15 0234)    PRN Meds: acetaminophen, ondansetron (ZOFRAN) IV  Physical Exam: Physical Exam             Weak elderly lady sitting up in bed is not able to state her name answers a few simple yes/no type questions appropriately. Denies being in pain Shallow breathing S1-S2 Generalized edema Vital Signs: BP 122/54 mmHg  Pulse 80  Temp(Src) 98.5 F (36.9 C) (Axillary)  Resp 17  Ht 5' (1.524 m)  Wt 58 kg (127 lb 13.9 oz)  BMI 24.97 kg/m2  SpO2 99% SpO2: SpO2: 99 % O2 Device: O2 Device: Not Delivered O2 Flow Rate:     Intake/output summary:   Intake/Output Summary (Last 24 hours) at 06/20/15 1851 Last data filed at 06/20/15 1810  Gross per 24 hour  Intake    120 ml  Output   1600 ml  Net  -1480 ml   LBM: Last BM Date: 06/18/15 Baseline Weight: Weight: 58.3 kg (128 lb 8.5 oz) Most recent weight: Weight: 58 kg (127 lb 13.9 oz)       Palliative  Assessment/Data: Flowsheet Rows        Most Recent Value   Intake Tab    Referral Department  Hospitalist   Unit at Time of Referral  Cardiac/Telemetry Unit   Palliative Care Primary Diagnosis  Sepsis/Infectious Disease   Date Notified  06/15/15   Palliative Care Type  Return patient Palliative Care   Reason for referral  Clarify Goals of Care   Date of Admission  06/12/15   Date first seen by Palliative Care  06/17/15   # of days Palliative referral response time  2 Day(s)   # of days IP prior to Palliative referral  3   Clinical Assessment    Palliative Performance Scale Score  30%   Psychosocial & Spiritual Assessment    Palliative Care Outcomes    Palliative Care follow-up planned  Yes, Facility      Additional Data Reviewed: CBC    Component Value Date/Time   WBC 5.3 06/20/2015 0449   RBC 3.20* 06/20/2015 0449   RBC 4.30 01/07/2011 1545   HGB 8.3* 06/20/2015 0449   HCT 27.0* 06/20/2015 0449   PLT 152 06/20/2015 0449   MCV 84.4 06/20/2015 0449   MCH 25.9* 06/20/2015 0449   MCHC 30.7 06/20/2015 0449   RDW 17.0* 06/20/2015 0449   LYMPHSABS 0.9 06/17/2015 0238   MONOABS 0.7 06/17/2015 0238   EOSABS 0.0 06/17/2015 0238   BASOSABS 0.1 06/17/2015 0238    CMP     Component Value Date/Time   NA 138 06/20/2015 0449   K 3.7 06/20/2015 0449   CL 105 06/20/2015 0449   CO2 24 06/20/2015 0449   GLUCOSE 106* 06/20/2015 0449   BUN 42* 06/20/2015 0449   CREATININE 1.75* 06/20/2015 0449   CREATININE 1.02* 04/10/2015 1034   CALCIUM 7.6* 06/20/2015 0449   PROT 4.7* 06/20/2015 0449   ALBUMIN 1.6* 06/20/2015 0449   AST 56* 06/20/2015  0449   ALT 37 06/20/2015 0449   ALKPHOS 72 06/20/2015 0449   BILITOT 0.3 06/20/2015 0449   GFRNONAA 24* 06/20/2015 0449   GFRAA 28* 06/20/2015 0449       Problem List:  Patient Active Problem List   Diagnosis Date Noted  . Pressure ulcer 06/20/2015  . Aspiration of vomitus   . Essential hypertension   . Hypokalemia   . Encounter for palliative care   . Goals of care, counseling/discussion   . Sepsis, unspecified organism ([place])   . Encounter for feeding tube placement   . Acute renal failure ([place])   . Renal failure   . Acute renal failure (ARF) ([place])   . Metabolic acidosis   . Proteus mirabilis infection   . Hyperkalemia   . Hypernatremia   . AKI (acute kidney injury) ([place])   . Dehydration   . Sepsis ([place])   . Diarrhea   . Acute encephalopathy   . Atrial fibrillation ([place])   . Nausea vomiting and diarrhea 06/12/2015  . Candidiasis of skin 03/02/2014  . Corn or callus 03/02/2014  . AVN (avascular necrosis of bone) ([place]) 01/01/2014  . Malnutrition of moderate degree ([place]) 12/21/2013  . Fall 12/20/2013  . Weakness 12/20/2013  . Recurrent UTI 12/20/2013  . A-fib ([place]) 12/20/2013  . Sinus arrhythmia 08/07/2013  . Radicular pain of left lower extremity 08/10/2012  . OA (osteoarthritis) of knee 08/10/2012  . Chronic pain 05/10/2012  . Sciatica 04/09/2011  . Gait instability 02/27/2011  . Hypertension 12/23/2010  . Anemia of chronic disease 12/23/2010  . Rheumatoid arthritis (  [place]) 12/23/2010  . LIVER MASS 03/13/2010  . HIP PAIN, LEFT 08/24/2008  . CKD (chronic kidney disease), stage II 01/20/2007  . Hyperlipidemia 07/19/2006  . DIVERTICULOSIS, COLON 07/19/2006  . CONSTIPATION NOS 07/19/2006  . OSTEOPOROSIS 07/19/2006     Palliative Care Assessment & Plan    1.Code Status:  DNR    Code Status Orders        Start     Ordered   06/12/15 1738  Do not attempt resuscitation (DNR)   Continuous    Question Answer Comment  Maintain current active treatments  Yes   Do not initiate new interventions Yes      06/12/15 1737       2. Goals of Care/Additional Recommendations:  Discussed case with Dr. Carles Collet.  Plan for re-eval by SLP. Spoke with guardian, [name] [name] about plan Dr.Tat and I had discussed as below:  - Ms Fonseca is currently tolerating enteral feeds with tube feeds  -ask speech therapy for repeat eval now that pt mental status is likely near her baseline  -if able to start dysphagia diet-->plan to d/c PANDA tube and allow po  -if unable to start diet-->will need to discuss with guardian about comfort care and comfort  feeds--G-tube/artificial nutrition long term is not an option  No plans for PEG or long term artificial nutrition and hydration.  If her intake progresses, consider SNF placement.  More likely, however, is continued poor nutrition and hydration.  If she continues to be unable to maintain nutrition and hydration, her expected time course would likely be less than 2 weeks and she may qualify for residential hospice.    Worthy Keeler, also expressed on 1/4 concern that her nephew has removed her from SNF in the past without guardian's consent.  Will need to continue to ensure that staff aware that nephew is not decision maker and patient is not to be allowed to leave with him.    Desire for further Chaplaincy support:no  3. Symptom Management:      1. As above  4. Palliative Prophylaxis:   Delirium Protocol  5. Prognosis: Weeks  6. Discharge Planning: Pending determination of her clinical course and further discussions with guardian caseworker [name] [name] or [name] [name] at [phone]. Care plan was discussed with Dr Carles Collet and [name] [name] from guardianship service  Thank you for allowing the Palliative Medicine Team to assist in the care of this patient.   Time In: 1300 Time Out: 1325 Total Time 25 Prolonged Time Billed  no        [id number] Micheline Rough, MD  06/20/2015, 6:51 PM  Please contact Palliative  Medicine Team phone at [phone] for questions and concerns.

## 2015-06-20 NOTE — Consult Note (Addendum)
WOC wound consult note Reason for Consult: Consult requested for right inner buttock. Pt is pending comfort care goals, according to EMR. She has just been incontinent of a large amt liquid stool. Wound type: Deep tissue injury to right inner buttock Pressure Ulcer POA: No Measurement: 2X2cm Wound bed: darker colored skin, no open wound Dressing procedure/placement/frequency: Deep tissue injuries are high risk to evolve into full thickness tissue loss despite optimal care. Reposition off affected area as often as possible. Barrier cream to protect and repel moisture.   Please re-consult if further assistance is needed.  Thank-you,  Julien Girt MSN, [place], [place], [place], [place]

## 2015-06-21 LAB — CBC
HCT: 28.4 % — ABNORMAL LOW (ref 36.0–46.0)
HEMOGLOBIN: 8.7 g/dL — AB (ref 12.0–15.0)
MCH: 26.1 pg (ref 26.0–34.0)
MCHC: 30.6 g/dL (ref 30.0–36.0)
MCV: 85.3 fL (ref 78.0–100.0)
Platelets: 221 10*3/uL (ref 150–400)
RBC: 3.33 MIL/uL — ABNORMAL LOW (ref 3.87–5.11)
RDW: 17.2 % — AB (ref 11.5–15.5)
WBC: 5.1 10*3/uL (ref 4.0–10.5)

## 2015-06-21 LAB — BASIC METABOLIC PANEL
ANION GAP: 9 (ref 5–15)
BUN: 35 mg/dL — AB (ref 6–20)
CALCIUM: 7.8 mg/dL — AB (ref 8.9–10.3)
CO2: 24 mmol/L (ref 22–32)
CREATININE: 1.53 mg/dL — AB (ref 0.44–1.00)
Chloride: 107 mmol/L (ref 101–111)
GFR calc Af Amer: 33 mL/min — ABNORMAL LOW (ref >60)
GFR, EST NON AFRICAN AMERICAN: 28 mL/min — AB (ref >60)
GLUCOSE: 114 mg/dL — AB (ref 65–99)
Potassium: 3.9 mmol/L (ref 3.5–5.1)
Sodium: 140 mmol/L (ref 135–145)

## 2015-06-21 LAB — GLUCOSE, CAPILLARY
GLUCOSE-CAPILLARY: 103 mg/dL — AB (ref 65–99)
GLUCOSE-CAPILLARY: 110 mg/dL — AB (ref 65–99)
Glucose-Capillary: 102 mg/dL — ABNORMAL HIGH (ref 65–99)
Glucose-Capillary: 106 mg/dL — ABNORMAL HIGH (ref 65–99)
Glucose-Capillary: 117 mg/dL — ABNORMAL HIGH (ref 65–99)

## 2015-06-21 LAB — MAGNESIUM: Magnesium: 2 mg/dL (ref 1.7–2.4)

## 2015-06-21 MED ORDER — LOPERAMIDE HCL 2 MG PO CAPS
2.0000 mg | ORAL_CAPSULE | ORAL | Status: DC | PRN
  Administered 2015-06-21: 2 mg via ORAL
  Filled 2015-06-21: qty 1

## 2015-06-21 NOTE — Care Management Important Message (Signed)
Important Message  Patient Details  Name: [name] [name] MRN: [id number] Date of Birth: [birth year]   Medicare Important Message Given:  Yes    Ralyn Stlaurent P Rainn Bullinger 06/21/2015, 2:43 PM

## 2015-06-21 NOTE — Progress Notes (Signed)
Pt's NG tube removed. Tolerated well. Will continue to monitor.

## 2015-06-21 NOTE — Progress Notes (Signed)
PROGRESS NOTE  ULISSA CLUNIE [id number] DOB: [birth year] DOA: 06/12/2015 PCP: Vic Blackbird, MD  Admit HPI / Brief Narrative: 82 year old F Hx CKD stage I, Fe deficiency Anemia, HTN, HLD, Diverticulosis of colon, and HSV who lives under the care of her nephew who presented to the ED on 06/12/2015 complaining of abdominal pain. Her nephew provided the history as the patient was encephalopathic. He stated that one day prior to admission she was in her usual health feeling well and they ate out the night before. Overnight she had no significant difficulty but early in the morning on 06/12/2015 she started having nausea, vomiting, diarrhea, and cramping abdominal pain.   In the emergency department she was found to be tachycardic and hypotensive w/ a lactic acid greater than 10, and in acute renal failure. She was given IV fluids, antibiotics, a CT scan of her abdomen was performed and pulmonary and critical care medicine was consulted for further evaluation.   HPI/Subjective: The patient is alert and will follow the examiner with her eyes. She will answer some very simple questions. Denies any headache, chest pain, shortness breath, abdominal pain. States that she feels hungry. No reports of this. Distress, vomiting, diarrhea. Had   Assessment/Plan:  Severe Sepsis due to Proteus UTI  Has completed 7 days of abx tx - stop abx today and follow clinically  -Ceftriaxone last dose 06/18/2015 -Monitor off antibiotics -BMP and CBC in the morning -Low-grade temp in the morning on 06/19/2015--none since then  Dysphagia Tolerating current clear liquid diet but cognitive deficit has thus far prevented safe advancement  -for much of hospitalization with PANDA tube tolerating enteral feeds -asked speech therapy for repeat eval now that pt mental status is likely near her baseline -appreciate speech therapy repeat eval-->dysphagia 1 with thin liquids -06/21/15--d/c PANDA tube and  start dysphagia 1 diet -monitor po intake next 48 hours-->if adequate they d/c to SNF ;  If unable or unwilling to eat to sustain herself-->need to address comfort care with guardian -ask ancillary staff to assist with meals  Anion gap acute metabolic acidosis - resolved -likely 2/2 starvation ketoacidosis + lactate + diarrhea + AKI - resolved   Newly diagnoses Atrial fibrillation w/ RVR -HR reasonably controlled - a poor candidate for anticoag given advanced age and frail state/high fall risk  -Metoprolol tartrate-->rate controlled  Hypertension  -BP currently controlled   Acute renal failure  -secondary to hypovolemia + ACE + pyelo - crt has improved but has not yet normalized - recheck in AM  -Baseline creatinine 0.8-1.0  Diarrhea -c.diff and stool pathogen panel neg -3-4 loose stool on 06/20/15 -PRN loperamide -may have been related to enteral feeding -seems to have improved  Hyperkalemia > Hypokalemia  -Kayexalate treatment > now requiring suplementation - recheck in AM   Hypernatremia -corrected w/ free water   Hypomagnesemia -replace and follow  Toxic metabolic encephalopathy -Minimize sedating medications - mental status w/o signif change over last 72hrs - ?if this is her new baseline - ?cognitive status at baseline   Goals of care -patient has a state appointed guardian who makes medical decisions on her behalf - her family has no legal authority to make medical decisions  -Palliative Care working to establish [place] w/ her guardian  -06/21/15--case discussed with Dr. Domingo Cocking  Code Status: DNR Family Communication: no family present at time of exam Disposition Plan: SNF v/s hospice care -  Consultants: Nephrology  Palliative Care  PCCM  Antibiotics: 12/28 Zosyn > 12/30  12/30 Ceftriaxone > 1/03  DVT prophylaxis: SQ heparin   Procedures/Studies: Ct Abdomen Pelvis Wo Contrast  06/12/2015  CLINICAL DATA:  Abdominal pain, diarrhea, elevated  lactate greater than 10, history diverticulosis of the colon, GERD, hypertension EXAM: CT ABDOMEN AND PELVIS WITHOUT CONTRAST TECHNIQUE: Multidetector CT imaging of the abdomen and pelvis was performed following the standard protocol without IV contrast. Sagittal and coronal MPR images reconstructed from axial data set. Oral contrast was not administered. COMPARISON:  04/16/2010 FINDINGS: Emphysematous changes at lung bases with minimal bibasilar atelectasis. Eventration RIGHT diaphragm containing liver and RIGHT kidney again noted. Extensive atherosclerotic calcifications aorta and coronary arteries. Large cavernous hemangioma seen at the RIGHT lobe of liver on the previous study is not well visualized on the current exam; hepatic contour appears concave at this site, question volume loss/contraction of this lesion since previous study, potentially measuring 2.0 x 1.8 cm image 18. Low-attenuation lesion centrally within spleen, slightly bilobed appearance, 2.8 x 1.7 cm image 7, not seen on prior noncontrast study but on earlier CT with contrast from 11/30/2008, multiple splenic hemangiomas were identified, 2 of which were located at this position now appear confluent. Gallbladder distended. LEFT renal cyst 1.6 x 1.6 cm image 14. Remainder of liver, spleen, pancreas, kidneys, and LEFT adrenal gland normal. Superior pole of the RIGHT kidney, superior aspect of liver and RIGHT adrenal gland are within the eventration and above imaged field. Hiatal hernia, moderate-sized. Enlargement of cardiac chambers. Increased stool and gas within rectum. Suboptimal assessment of GI tract due to lack of IV and oral contrast and inadequate distention. Suspicion of rectal wall thickening question proctitis. Bowel loops otherwise grossly unremarkable within limitations of exam. No definite free intraperitoneal air or fluid. Intra-abdominal fat and mesenteric fat planes are less distinct than on the previous exam though uncertain if  this is due to hypoproteinemia or mild scattered edema Question RIGHT ovarian cyst 2.4 x 3.0 cm image 43. Bladder decompressed. Severe osseous demineralization with advanced degenerative changes of the LEFT hip joint with acetabulum protrusio. Marked compression deformity of L3 vertebral body. IMPRESSION: Interval partial regression of hepatic hemangioma at RIGHT lobe liver. Slight increase in size of previously identified splenic hemangiomas. Question rectal wall thickening/proctitis, recommend correlation with proctoscopy. Extensive atherosclerotic disease. Question RIGHT ovarian cyst. No other definite intra-abdominal or intrapelvic abnormalities identified on exam limited by a lack of IV and oral contrast. Electronically Signed   By: Lavonia Dana M.D.   On: 06/12/2015 17:05   US Renal Port  06/14/2015  CLINICAL DATA:  82 year old female with history of acute renal failure. EXAM: RENAL / URINARY TRACT ULTRASOUND COMPLETE COMPARISON:  No priors. FINDINGS: Right Kidney: Length: 7.8 cm. Diffusely increased echogenicity, with areas of mild multifocal cortical thinning. No mass or hydronephrosis visualized. Left Kidney: Length: 9.7 cm. Diffusely increased echogenicity, with areas of mild multifocal cortical thinning. Tiny lesion in the lower pole measuring 1.1 x 1.1 x 1.2 cm which is hypoechoic with increased through transmission, compatible with a small cyst. No hydronephrosis visualized. Bladder: Appears normal for degree of bladder distention. IMPRESSION: 1. No hydronephrosis or other findings to account for the patient's acute renal failure. 2. Mild diffuse increased cortical echogenicity suggesting medical renal disease. Multifocal mild cortical scarring. 3. Small cysts in the lower pole of the left kidney incidentally noted. Electronically Signed   By: Vinnie Langton M.D.   On: 06/14/2015 22:24   Dg Chest Port 1 View  06/14/2015  CLINICAL DATA:  Aspiration pneumonia. EXAM: PORTABLE CHEST 1 VIEW  COMPARISON:  06/12/2015 and chest CT dated 07/12/2009 FINDINGS: The patient has a new moderate left pleural effusion. There is a chronic posterior right diaphragmatic hernia containing a portion of the liver and the right kidney and the right adrenal gland. Heart size is normal. There is tortuosity and calcification of the thoracic aorta. Soft tissue density at the right apex medially represents tortuous brachiocephalic vessels. No acute osseous abnormality. Degenerative changes at the shoulders and at the thoracolumbar junction of the spine. IMPRESSION: New moderate left effusion.  Aortic atherosclerosis. Electronically Signed   By: Lorriane Shire M.D.   On: 06/14/2015 14:36   Dg Chest Portable 1 View  06/12/2015  CLINICAL DATA:  Altered mental status. Abdominal pain this morning with vomiting and diarrhea. EXAM: PORTABLE CHEST 1 VIEW COMPARISON:  Chest x-ray dated 12/20/2013 FINDINGS: Heart size and pulmonary vascularity are normal. Extensive calcification in the thoracic aorta. The lungs are clear. Diffuse osteopenia. No acute osseous abnormality. IMPRESSION: No acute abnormalities.  Aortic atherosclerosis. Electronically Signed   By: Lorriane Shire M.D.   On: 06/12/2015 16:32   Dg Abd Portable 1v  06/15/2015  CLINICAL DATA:  NG/feeding tube placement. EXAM: PORTABLE ABDOMEN - 1 VIEW COMPARISON:  None. FINDINGS: A NG/feeding tube is identified with tip overlying the proximal -mid stomach. The bowel gas pattern is unchanged. IMPRESSION: NG/feeding tube with tip overlying the proximal -mid stomach. Electronically Signed   By: Margarette Canada M.D.   On: 06/15/2015 19:46   Dg Abd Portable 1v  06/15/2015  CLINICAL DATA:  82 year old female with history of feeding tube placement. EXAM: PORTABLE ABDOMEN - 1 VIEW COMPARISON:  Abdominal radiograph 03/27/2011. FINDINGS: Feeding tube noted with tip projecting over the body of the stomach. Gas and stool are noted throughout the colon extending to the distal rectum.  Several nondilated gas-filled loops of small bowel are noted. No pathologic dilatation of small bowel. No gross evidence of pneumoperitoneum on this single supine view of the abdomen. Extensive atherosclerosis throughout the visualized vasculature. IMPRESSION: 1. Tip of feeding tube is in the body of the stomach. Electronically Signed   By: Vinnie Langton M.D.   On: 06/15/2015 18:23         Subjective: Pt is awake and answers simple questions.  Denies cp, sob, HA, abd pain.  Had 3 loose stools overnight.  No resp distress or uncontrolled pain.  Objective: Filed Vitals:   06/21/15 0408 06/21/15 0918 06/21/15 1543 06/21/15 1954  BP: 129/68 140/56 140/58 113/68  Pulse: 54 88 74 62  Temp: 98 F (36.7 C) 98.6 F (37 C) 98.5 F (36.9 C) 99.2 F (37.3 C)  TempSrc: Axillary Axillary Axillary Oral  Resp: 20 17 16 16   Height:      Weight:    62 kg (136 lb 11 oz)  SpO2: 100% 96% 92% 96%    Intake/Output Summary (Last 24 hours) at 06/21/15 2059 Last data filed at 06/21/15 1804  Gross per 24 hour  Intake   1670 ml  Output   1450 ml  Net    220 ml   Weight change: -0.6 kg (-1 lb 5.2 oz) Exam:   General:  Pt is alert, follows commands appropriately, not in acute distress  HEENT: No icterus, No thrush, No neck mass, [place]/AT  Cardiovascular: IRRR, S1/S2, no rubs, no gallops  Respiratory: CTA bilaterally, no wheezing, no crackles, no rhonchi  Abdomen: Soft/+BS, non tender, non distended, no guarding; no HSM  Extremities: 1+ LE edema, No lymphangitis, No petechiae, No rashes, no synovitis; no clubbing or cyanosis  Data Reviewed: Basic Metabolic Panel:  Recent Labs Lab 06/16/15 0420 06/16/15 1100 06/17/15 0238 06/18/15 0228 06/20/15 0449 06/21/15 0353  NA 150* 147* 145 141 138 140  K 3.4* 3.8 3.1* 3.1* 3.7 3.9  CL 119* 119* 112* 106 105 107  CO2 20* 17* 22 25 24 24   GLUCOSE 123* 137* 122* 136* 106* 114*  BUN 76* 73* 57* 44* 42* 35*  CREATININE 3.44* 3.01* 2.32* 1.92*  1.75* 1.53*  CALCIUM 7.9* 7.6* 7.5* 7.6* 7.6* 7.8*  MG 2.1  --  1.8 1.5* 2.2 2.0  PHOS  --   --   --  2.5  --   --    Liver Function Tests:  Recent Labs Lab 06/15/15 0432 06/16/15 0420 06/17/15 0238 06/18/15 0228 06/20/15 0449  AST 58* 61* 49*  --  56*  ALT 23 27 24   --  37  ALKPHOS 49 53 49  --  72  BILITOT 0.8 0.5 0.6  --  0.3  PROT 5.2* 5.4* 5.1*  --  4.7*  ALBUMIN 2.4* 2.2* 2.1* 1.9* 1.6*   No results for input(s): LIPASE, AMYLASE in the last 168 hours. No results for input(s): AMMONIA in the last 168 hours. CBC:  Recent Labs Lab 06/15/15 0432 06/16/15 0420 06/17/15 0238 06/18/15 0228 06/20/15 0449 06/21/15 0353  WBC 8.3 4.8 5.1 7.0 5.3 5.1  NEUTROABS 7.3 3.7 3.4  --   --   --   HGB 9.7* 9.9* 10.0* 9.9* 8.3* 8.7*  HCT 30.0* 30.6* 31.1* 32.0* 27.0* 28.4*  MCV 81.3 81.0 82.3 82.7 84.4 85.3  PLT 140* 112* 86* 114* 152 221   Cardiac Enzymes: No results for input(s): CKTOTAL, CKMB, CKMBINDEX, TROPONINI in the last 168 hours. BNP: Invalid input(s): POCBNP CBG:  Recent Labs Lab 06/21/15 0404 06/21/15 0749 06/21/15 1143 06/21/15 1712 06/21/15 1950  GLUCAP 102* 106* 117* 103* 110*    Recent Results (from the past 240 hour(s))  Culture, blood (routine x 2)     Status: None   Collection Time: 06/12/15  4:05 PM  Result Value Ref Range Status   Specimen Description BLOOD LEFT ARM  Final   Special Requests IN PEDIATRIC BOTTLE 2CC  Final   Culture NO GROWTH 5 DAYS  Final   Report Status 06/17/2015 FINAL  Final  Culture, blood (routine x 2)     Status: None   Collection Time: 06/12/15  4:12 PM  Result Value Ref Range Status   Specimen Description BLOOD RIGHT ARM  Final   Special Requests IN PEDIATRIC BOTTLE 2CC  Final   Culture NO GROWTH 5 DAYS  Final   Report Status 06/17/2015 FINAL  Final  Urine culture     Status: None   Collection Time: 06/12/15  5:59 PM  Result Value Ref Range Status   Specimen Description URINE, CATHETERIZED  Final   Special  Requests NONE  Final   Culture >=100,000 COLONIES/mL PROTEUS MIRABILIS  Final   Report Status 06/14/2015 FINAL  Final   Organism ID, Bacteria PROTEUS MIRABILIS  Final      Susceptibility   Proteus mirabilis - MIC*    AMPICILLIN <=2 SENSITIVE Sensitive     CEFAZOLIN <=4 SENSITIVE Sensitive     CEFTRIAXONE <=1 SENSITIVE Sensitive     CIPROFLOXACIN <=0.25 SENSITIVE Sensitive     GENTAMICIN <=1 SENSITIVE Sensitive     IMIPENEM <=0.25 SENSITIVE Sensitive  NITROFURANTOIN 256 RESISTANT Resistant     TRIMETH/SULFA <=20 SENSITIVE Sensitive     AMPICILLIN/SULBACTAM <=2 SENSITIVE Sensitive     * >=100,000 COLONIES/mL PROTEUS MIRABILIS  MRSA PCR Screening     Status: None   Collection Time: 06/12/15  7:16 PM  Result Value Ref Range Status   MRSA by PCR NEGATIVE NEGATIVE Final    Comment:        The GeneXpert MRSA Assay (FDA approved for NASAL specimens only), is one component of a comprehensive MRSA colonization surveillance program. It is not intended to diagnose MRSA infection nor to guide or monitor treatment for MRSA infections.   Gastrointestinal Panel by PCR , Stool     Status: None   Collection Time: 06/13/15  3:32 PM  Result Value Ref Range Status   Campylobacter species NOT DETECTED NOT DETECTED Final   Plesimonas shigelloides NOT DETECTED NOT DETECTED Final   Salmonella species NOT DETECTED NOT DETECTED Final   Yersinia enterocolitica NOT DETECTED NOT DETECTED Final   Vibrio species NOT DETECTED NOT DETECTED Final   Vibrio cholerae NOT DETECTED NOT DETECTED Final   Enteroaggregative E coli (EAEC) NOT DETECTED NOT DETECTED Final   Enteropathogenic E coli (EPEC) NOT DETECTED NOT DETECTED Final   Enterotoxigenic E coli (ETEC) NOT DETECTED NOT DETECTED Final   Shiga like toxin producing E coli (STEC) NOT DETECTED NOT DETECTED Final   E. coli O157 NOT DETECTED NOT DETECTED Final   Shigella/Enteroinvasive E coli (EIEC) NOT DETECTED NOT DETECTED Final   Cryptosporidium NOT  DETECTED NOT DETECTED Final   Cyclospora cayetanensis NOT DETECTED NOT DETECTED Final   Entamoeba histolytica NOT DETECTED NOT DETECTED Final   Giardia lamblia NOT DETECTED NOT DETECTED Final   Adenovirus F40/41 NOT DETECTED NOT DETECTED Final   Astrovirus NOT DETECTED NOT DETECTED Final   Norovirus GI/GII NOT DETECTED NOT DETECTED Final   Rotavirus A NOT DETECTED NOT DETECTED Final   Sapovirus (I, II, IV, and V) NOT DETECTED NOT DETECTED Final  C difficile quick scan w PCR reflex     Status: None   Collection Time: 06/16/15 12:44 PM  Result Value Ref Range Status   C Diff antigen NEGATIVE NEGATIVE Final   C Diff toxin NEGATIVE NEGATIVE Final   C Diff interpretation Negative for toxigenic C. difficile  Final     Scheduled Meds: . antiseptic oral rinse  7 mL Mouth Rinse q12n4p  . chlorhexidine  15 mL Mouth Rinse BID  . heparin  5,000 Units Subcutaneous 3 times per day  . metoprolol tartrate  12.5 mg Per Tube BID   Continuous Infusions: . sodium chloride 30 mL/hr at 06/21/15 0400     Tinamarie Przybylski, DO  [facility] Pager [phone]  If 7PM-7AM, please contact night-coverage [email or web address] Password [email or web address] 06/21/2015, 8:59 PM   LOS: 9 days

## 2015-06-21 NOTE — Progress Notes (Signed)
Daily Progress Note   Patient Name: [name] [name]       Date: 06/21/2015 DOB: 1921/04/19  Age: 82 y.o. MRN#: [id number] Attending Physician: Orson Eva, MD Primary Care Physician: Vic Blackbird, MD Admit Date: 06/12/2015  Reason for Consultation/Follow-up: Establishing goals of care  Subjective:  Awake sitting up in bed.  Remains confused and sleepy. Interval Events:  Discussed with attending physician Dr. Carles Collet. Patient with overall lack of significant improvement in the course of this hospitalization thus far. Patient still has nasogastric tube that was being used for correction of hypernatremia as well as artificial nutrition. Plan to pull NG and monitor intake.  I called and discussed with Cassandra from her guardian service as outlined below.   Length of Stay: 9 days  Current Medications: Scheduled Meds:  . antiseptic oral rinse  7 mL Mouth Rinse q12n4p  . chlorhexidine  15 mL Mouth Rinse BID  . heparin  5,000 Units Subcutaneous 3 times per day  . metoprolol tartrate  12.5 mg Per Tube BID    Continuous Infusions: . sodium chloride 30 mL/hr at 06/21/15 0400    PRN Meds: acetaminophen, loperamide, ondansetron (ZOFRAN) IV  Physical Exam: Physical Exam             Weak elderly lady sitting up in bed is not able to state her name answers a few simple yes/no type questions appropriately. Denies being in pain Shallow breathing S1-S2 Generalized edema Vital Signs: BP 113/68 mmHg  Pulse 62  Temp(Src) 99.2 F (37.3 C) (Oral)  Resp 16  Ht 5' (1.524 m)  Wt 62 kg (136 lb 11 oz)  BMI 26.69 kg/m2  SpO2 96% SpO2: SpO2: 96 % O2 Device: O2 Device: Not Delivered O2 Flow Rate:    Intake/output summary:   Intake/Output Summary (Last 24 hours) at 06/21/15 1956 Last data  filed at 06/21/15 1804  Gross per 24 hour  Intake   2975 ml  Output   1450 ml  Net   1525 ml   LBM: Last BM Date: 06/18/15 Baseline Weight: Weight: 58.3 kg (128 lb 8.5 oz) Most recent weight: Weight: 62 kg (136 lb 11 oz)       Palliative Assessment/Data: Flowsheet Rows        Most Recent Value   Intake Tab    Referral  Department  Hospitalist   Unit at Time of Referral  Cardiac/Telemetry Unit   Palliative Care Primary Diagnosis  Sepsis/Infectious Disease   Date Notified  06/15/15   Palliative Care Type  Return patient Palliative Care   Reason for referral  Clarify Goals of Care   Date of Admission  06/12/15   Date first seen by Palliative Care  06/17/15   # of days Palliative referral response time  2 Day(s)   # of days IP prior to Palliative referral  3   Clinical Assessment    Palliative Performance Scale Score  30%   Psychosocial & Spiritual Assessment    Palliative Care Outcomes    Palliative Care follow-up planned  Yes, Facility      Additional Data Reviewed: CBC    Component Value Date/Time   WBC 5.1 06/21/2015 0353   RBC 3.33* 06/21/2015 0353   RBC 4.30 01/07/2011 1545   HGB 8.7* 06/21/2015 0353   HCT 28.4* 06/21/2015 0353   PLT 221 06/21/2015 0353   MCV 85.3 06/21/2015 0353   MCH 26.1 06/21/2015 0353   MCHC 30.6 06/21/2015 0353   RDW 17.2* 06/21/2015 0353   LYMPHSABS 0.9 06/17/2015 0238   MONOABS 0.7 06/17/2015 0238   EOSABS 0.0 06/17/2015 0238   BASOSABS 0.1 06/17/2015 0238    CMP     Component Value Date/Time   NA 140 06/21/2015 0353   K 3.9 06/21/2015 0353   CL 107 06/21/2015 0353   CO2 24 06/21/2015 0353   GLUCOSE 114* 06/21/2015 0353   BUN 35* 06/21/2015 0353   CREATININE 1.53* 06/21/2015 0353   CREATININE 1.02* 04/10/2015 1034   CALCIUM 7.8* 06/21/2015 0353   PROT 4.7* 06/20/2015 0449   ALBUMIN 1.6* 06/20/2015 0449   AST 56* 06/20/2015 0449   ALT 37 06/20/2015 0449   ALKPHOS 72 06/20/2015 0449   BILITOT 0.3 06/20/2015 0449   GFRNONAA  28* 06/21/2015 0353   GFRAA 33* 06/21/2015 0353       Problem List:  Patient Active Problem List   Diagnosis Date Noted  . Pressure ulcer 06/20/2015  . Aspiration of vomitus   . Essential hypertension   . Hypokalemia   . Encounter for palliative care   . Goals of care, counseling/discussion   . Sepsis, unspecified organism ([place])   . Encounter for feeding tube placement   . Acute renal failure ([place])   . Renal failure   . Acute renal failure (ARF) ([place])   . Metabolic acidosis   . Proteus mirabilis infection   . Hyperkalemia   . Hypernatremia   . AKI (acute kidney injury) ([place])   . Dehydration   . Sepsis ([place])   . Diarrhea   . Acute encephalopathy   . Atrial fibrillation ([place])   . Nausea vomiting and diarrhea 06/12/2015  . Candidiasis of skin 03/02/2014  . Corn or callus 03/02/2014  . AVN (avascular necrosis of bone) ([place]) 01/01/2014  . Malnutrition of moderate degree ([place]) 12/21/2013  . Fall 12/20/2013  . Weakness 12/20/2013  . Recurrent UTI 12/20/2013  . A-fib ([place]) 12/20/2013  . Sinus arrhythmia 08/07/2013  . Radicular pain of left lower extremity 08/10/2012  . OA (osteoarthritis) of knee 08/10/2012  . Chronic pain 05/10/2012  . Sciatica 04/09/2011  . Gait instability 02/27/2011  . Hypertension 12/23/2010  . Anemia of chronic disease 12/23/2010  . Rheumatoid arthritis ([place]) 12/23/2010  . LIVER MASS 03/13/2010  . HIP PAIN, LEFT 08/24/2008  . CKD (chronic kidney disease), stage II  01/20/2007  . Hyperlipidemia 07/19/2006  . DIVERTICULOSIS, COLON 07/19/2006  . CONSTIPATION NOS 07/19/2006  . OSTEOPOROSIS 07/19/2006     Palliative Care Assessment & Plan    1.Code Status:  DNR    Code Status Orders        Start     Ordered   06/12/15 1738  Do not attempt resuscitation (DNR)   Continuous    Question Answer Comment  Maintain current active treatments Yes   Do not initiate new interventions Yes      06/12/15 1737       2. Goals of  Care/Additional Recommendations:  Discussed case with Dr. Carles Collet.  She was re-eval by SLP and has recommended diet. Spoke with guardian, Vito Backers about plan:  -She is able to start dysphagia diet-->plan to d/c PANDA tube and allow po.  Continue to monitor intake over next 48 hours.  No plans for PEG or long term artificial nutrition and hydration.  If her intake progresses, consider SNF placement.  If she continues to be unable to maintain nutrition and hydration, her expected time course would likely be less than 2 weeks and she may qualify for residential hospice.    Worthy Keeler, also expressed on 1/4 concern that her nephew has removed her from SNF in the past without guardian's consent.  Will need to continue to ensure that staff aware that nephew is not decision maker and patient is not to be allowed to leave with him.    Desire for further Chaplaincy support:no  3. Symptom Management:      1. As above  4. Palliative Prophylaxis:   Delirium Protocol  5. Prognosis: Weeks  6. Discharge Planning: Pending determination of her clinical course and further discussions with guardian caseworker Erline Levine or Vito Backers at [phone]. Care plan was discussed with Dr Carles Collet and Vito Backers from guardianship service.   Thank you for allowing the Palliative Medicine Team to assist in the care of this patient.   Time In: 0900 Time Out: 0925 Total Time 25 Prolonged Time Billed  no        [id number] Micheline Rough, MD  06/21/2015, 7:56 PM  Please contact Palliative Medicine Team phone at [phone] for questions and concerns.

## 2015-06-21 NOTE — Progress Notes (Signed)
Speech Language Pathology Treatment: Dysphagia  Patient Details Name: [name] [name] MRN: [id number] DOB: 1920-08-06 Today's Date: 06/21/2015 Time: 0812-0820 SLP Time Calculation (min) (ACUTE ONLY): 8 min  Assessment / Plan / Recommendation Clinical Impression  Pt alert and required total feeding assist. She continues with oral holding and prolonged transit but may be slightly improved from previous sessions. Difficult to determine if bolus breaks apart to pharynx during oral delay or if able to orally contain entire bolus. Altered respiratory pattern x 1, no cough/throat clear or wet respirations however cannot rule out silent aspiration. SLP upgraded diet texture to puree (Dys 1), continue thin liquids, straws allowed and crush meds. Pt may require dry spoon presentation to oral cavity to facilitate oral transit. Full assist with feeding and strategies. SLP will sign off. Please reconsult if needed.    HPI HPI: 82 yo Female with PMH significant for CKD, iron deficiency anemia and HTN who lives under the care of her nephew who presented to ED on 06/12/2015 with abdominal pain, nausea, vomiting and diarrhea.       SLP Plan  All goals met;Discharge SLP treatment due to (comment)     Recommendations  Diet recommendations: Dysphagia 1 (puree);Thin liquid Liquids provided via: Cup;Straw Medication Administration: Crushed with puree Supervision: Staff to assist with self feeding;Full supervision/cueing for compensatory strategies Compensations: Slow rate;Small sips/bites;Other (Comment) Postural Changes and/or Swallow Maneuvers: Seated upright 90 degrees              Oral Care Recommendations: Oral care QID Follow up Recommendations: None Plan: All goals met;Discharge SLP treatment due to (comment)   Houston Siren 06/21/2015, 8:27 AM   Orbie Pyo Colvin Caroli.Ed [employer] [phone]

## 2015-06-21 NOTE — Clinical Social Work Note (Signed)
CSW continuing to follow patient's progress and will facilitate discharge to [facility] when medically stable.  Guardianship agency is [employer] [phone]. Patient's nephew is not to be provided with information regarding patient or discharge plans as the legal guardian will communicate with nephew.  Carroll Lingelbach Givens, MSW, LCSW Licensed Clinical Social Worker [place] [phone]

## 2015-06-22 DIAGNOSIS — A419 Sepsis, unspecified organism: Secondary | ICD-10-CM

## 2015-06-22 DIAGNOSIS — E875 Hyperkalemia: Secondary | ICD-10-CM

## 2015-06-22 LAB — BASIC METABOLIC PANEL
ANION GAP: 10 (ref 5–15)
BUN: 29 mg/dL — ABNORMAL HIGH (ref 6–20)
CO2: 24 mmol/L (ref 22–32)
Calcium: 8 mg/dL — ABNORMAL LOW (ref 8.9–10.3)
Chloride: 110 mmol/L (ref 101–111)
Creatinine, Ser: 1.33 mg/dL — ABNORMAL HIGH (ref 0.44–1.00)
GFR, EST AFRICAN AMERICAN: 39 mL/min — AB (ref >60)
GFR, EST NON AFRICAN AMERICAN: 34 mL/min — AB (ref >60)
Glucose, Bld: 86 mg/dL (ref 65–99)
POTASSIUM: 4.4 mmol/L (ref 3.5–5.1)
SODIUM: 144 mmol/L (ref 135–145)

## 2015-06-22 LAB — CBC
HEMATOCRIT: 29.6 % — AB (ref 36.0–46.0)
HEMOGLOBIN: 8.9 g/dL — AB (ref 12.0–15.0)
MCH: 25.6 pg — ABNORMAL LOW (ref 26.0–34.0)
MCHC: 30.1 g/dL (ref 30.0–36.0)
MCV: 85.1 fL (ref 78.0–100.0)
Platelets: 274 10*3/uL (ref 150–400)
RBC: 3.48 MIL/uL — AB (ref 3.87–5.11)
RDW: 17.1 % — AB (ref 11.5–15.5)
WBC: 5.7 10*3/uL (ref 4.0–10.5)

## 2015-06-22 LAB — GLUCOSE, CAPILLARY
GLUCOSE-CAPILLARY: 98 mg/dL (ref 65–99)
GLUCOSE-CAPILLARY: 91 mg/dL (ref 65–99)
Glucose-Capillary: 91 mg/dL (ref 65–99)

## 2015-06-22 NOTE — Progress Notes (Signed)
PROGRESS NOTE  [name] [name] [id number] DOB: Jan 24, 1921 DOA: 06/12/2015 PCP: Vic Blackbird, MD  Admit HPI / Brief Narrative: 82 year old F Hx CKD stage I, Fe deficiency Anemia, HTN, HLD, Diverticulosis of colon, and HSV who lives under the care of her nephew who presented to the ED on 06/12/2015 complaining of abdominal pain. Her nephew provided the history as the patient was encephalopathic. He stated that one day prior to admission she was in her usual health feeling well and they ate out the night before. Overnight she had no significant difficulty but early in the morning on 06/12/2015 she started having nausea, vomiting, diarrhea, and cramping abdominal pain.   In the emergency department she was found to be tachycardic and hypotensive w/ a lactic acid greater than 10, and in acute renal failure. She was given IV fluids, antibiotics, a CT scan of her abdomen was performed and pulmonary and critical care medicine was consulted for further evaluation.   HPI/Subjective: The patient is alert and will follow the examiner with her eyes. She will answer some very simple questions. Denies any headache, chest pain, shortness breath, abdominal pain. States that she feels hungry. No reports of this. Distress, vomiting, diarrhea. Ate 40% breakfast, 20% lunch, but drink fluids good.  Assessment/Plan:  Severe Sepsis due to Proteus UTI  Has completed 7 days of abx tx - stop abx today and follow clinically  -Ceftriaxone last dose 06/18/2015 -Monitor off antibiotics -BMP and CBC in the morning -Low-grade temp in the morning on 06/19/2015--none since then  Dysphagia Tolerating current clear liquid diet but cognitive deficit has thus far prevented safe advancement  -for much of hospitalization with PANDA tube tolerating enteral feeds -asked speech therapy for repeat eval now that pt mental status is likely near her baseline -appreciate speech therapy repeat eval-->dysphagia 1  with thin liquids -06/21/15--d/c PANDA tube and start dysphagia 1 diet -monitor po intake next 48 hours-->if adequate they d/c to SNF ; If unable or unwilling to eat to sustain herself-->need to address comfort care with guardian -ask ancillary staff to assist with meals -consult nutrition to recommend supplement  Anion gap acute metabolic acidosis - resolved -likely 2/2 starvation ketoacidosis + lactate + diarrhea + AKI - resolved   Newly diagnoses Atrial fibrillation w/ RVR -HR reasonably controlled - a poor candidate for anticoag given advanced age and frail state/high fall risk  -Metoprolol tartrate-->rate controlled  Hypertension  -BP currently controlled   Acute renal failure  -secondary to hypovolemia + ACE + pyelo - crt has improved but has not yet normalized - recheck in AM  -Baseline creatinine 0.8-1.0  Diarrhea -c.diff and stool pathogen panel neg -3-4 loose stool on 06/20/15 -PRN loperamide -may have been related to enteral feeding -seems to have improved  Hyperkalemia > Hypokalemia  -Kayexalate treatment > now requiring suplementation - recheck in AM   Hypernatremia -corrected w/ free water   Hypomagnesemia -replace and follow  Toxic metabolic encephalopathy -Minimize sedating medications - mental status w/o signif change over last 96hrs - suspect this is her new baseline - ?cognitive status at baseline   Goals of care -patient has a state appointed guardian who makes medical decisions on her behalf - her family has no legal authority to make medical decisions  -Palliative Care working to establish [place] w/ her guardian  -06/21/15--case discussed with Dr. Domingo Cocking  Code Status: DNR Family Communication: no family present at time of exam Disposition Plan: SNF v/s  hospice care -  Consultants: Nephrology  Palliative Care  PCCM  Antibiotics: 12/28 Zosyn > 12/30  12/30 Ceftriaxone > 1/03  DVT prophylaxis: SQ heparin    Procedures/Studies: Ct  Abdomen Pelvis Wo Contrast  06/12/2015  CLINICAL DATA:  Abdominal pain, diarrhea, elevated lactate greater than 10, history diverticulosis of the colon, GERD, hypertension EXAM: CT ABDOMEN AND PELVIS WITHOUT CONTRAST TECHNIQUE: Multidetector CT imaging of the abdomen and pelvis was performed following the standard protocol without IV contrast. Sagittal and coronal MPR images reconstructed from axial data set. Oral contrast was not administered. COMPARISON:  04/16/2010 FINDINGS: Emphysematous changes at lung bases with minimal bibasilar atelectasis. Eventration RIGHT diaphragm containing liver and RIGHT kidney again noted. Extensive atherosclerotic calcifications aorta and coronary arteries. Large cavernous hemangioma seen at the RIGHT lobe of liver on the previous study is not well visualized on the current exam; hepatic contour appears concave at this site, question volume loss/contraction of this lesion since previous study, potentially measuring 2.0 x 1.8 cm image 18. Low-attenuation lesion centrally within spleen, slightly bilobed appearance, 2.8 x 1.7 cm image 7, not seen on prior noncontrast study but on earlier CT with contrast from 11/30/2008, multiple splenic hemangiomas were identified, 2 of which were located at this position now appear confluent. Gallbladder distended. LEFT renal cyst 1.6 x 1.6 cm image 14. Remainder of liver, spleen, pancreas, kidneys, and LEFT adrenal gland normal. Superior pole of the RIGHT kidney, superior aspect of liver and RIGHT adrenal gland are within the eventration and above imaged field. Hiatal hernia, moderate-sized. Enlargement of cardiac chambers. Increased stool and gas within rectum. Suboptimal assessment of GI tract due to lack of IV and oral contrast and inadequate distention. Suspicion of rectal wall thickening question proctitis. Bowel loops otherwise grossly unremarkable within limitations of exam. No definite free intraperitoneal air or fluid. Intra-abdominal fat  and mesenteric fat planes are less distinct than on the previous exam though uncertain if this is due to hypoproteinemia or mild scattered edema Question RIGHT ovarian cyst 2.4 x 3.0 cm image 43. Bladder decompressed. Severe osseous demineralization with advanced degenerative changes of the LEFT hip joint with acetabulum protrusio. Marked compression deformity of L3 vertebral body. IMPRESSION: Interval partial regression of hepatic hemangioma at RIGHT lobe liver. Slight increase in size of previously identified splenic hemangiomas. Question rectal wall thickening/proctitis, recommend correlation with proctoscopy. Extensive atherosclerotic disease. Question RIGHT ovarian cyst. No other definite intra-abdominal or intrapelvic abnormalities identified on exam limited by a lack of IV and oral contrast. Electronically Signed   By: Lavonia Dana M.D.   On: 06/12/2015 17:05   US Renal Port  06/14/2015  CLINICAL DATA:  82 year old female with history of acute renal failure. EXAM: RENAL / URINARY TRACT ULTRASOUND COMPLETE COMPARISON:  No priors. FINDINGS: Right Kidney: Length: 7.8 cm. Diffusely increased echogenicity, with areas of mild multifocal cortical thinning. No mass or hydronephrosis visualized. Left Kidney: Length: 9.7 cm. Diffusely increased echogenicity, with areas of mild multifocal cortical thinning. Tiny lesion in the lower pole measuring 1.1 x 1.1 x 1.2 cm which is hypoechoic with increased through transmission, compatible with a small cyst. No hydronephrosis visualized. Bladder: Appears normal for degree of bladder distention. IMPRESSION: 1. No hydronephrosis or other findings to account for the patient's acute renal failure. 2. Mild diffuse increased cortical echogenicity suggesting medical renal disease. Multifocal mild cortical scarring. 3. Small cysts in the lower pole of the left kidney incidentally noted. Electronically Signed   By: Vinnie Langton M.D.   On: 06/14/2015  22:24   Dg Chest Port 1  View  06/14/2015  CLINICAL DATA:  Aspiration pneumonia. EXAM: PORTABLE CHEST 1 VIEW COMPARISON:  06/12/2015 and chest CT dated 07/12/2009 FINDINGS: The patient has a new moderate left pleural effusion. There is a chronic posterior right diaphragmatic hernia containing a portion of the liver and the right kidney and the right adrenal gland. Heart size is normal. There is tortuosity and calcification of the thoracic aorta. Soft tissue density at the right apex medially represents tortuous brachiocephalic vessels. No acute osseous abnormality. Degenerative changes at the shoulders and at the thoracolumbar junction of the spine. IMPRESSION: New moderate left effusion.  Aortic atherosclerosis. Electronically Signed   By: Lorriane Shire M.D.   On: 06/14/2015 14:36   Dg Chest Portable 1 View  06/12/2015  CLINICAL DATA:  Altered mental status. Abdominal pain this morning with vomiting and diarrhea. EXAM: PORTABLE CHEST 1 VIEW COMPARISON:  Chest x-ray dated 12/20/2013 FINDINGS: Heart size and pulmonary vascularity are normal. Extensive calcification in the thoracic aorta. The lungs are clear. Diffuse osteopenia. No acute osseous abnormality. IMPRESSION: No acute abnormalities.  Aortic atherosclerosis. Electronically Signed   By: Lorriane Shire M.D.   On: 06/12/2015 16:32   Dg Abd Portable 1v  06/15/2015  CLINICAL DATA:  NG/feeding tube placement. EXAM: PORTABLE ABDOMEN - 1 VIEW COMPARISON:  None. FINDINGS: A NG/feeding tube is identified with tip overlying the proximal -mid stomach. The bowel gas pattern is unchanged. IMPRESSION: NG/feeding tube with tip overlying the proximal -mid stomach. Electronically Signed   By: Margarette Canada M.D.   On: 06/15/2015 19:46   Dg Abd Portable 1v  06/15/2015  CLINICAL DATA:  82 year old female with history of feeding tube placement. EXAM: PORTABLE ABDOMEN - 1 VIEW COMPARISON:  Abdominal radiograph 03/27/2011. FINDINGS: Feeding tube noted with tip projecting over the body of the  stomach. Gas and stool are noted throughout the colon extending to the distal rectum. Several nondilated gas-filled loops of small bowel are noted. No pathologic dilatation of small bowel. No gross evidence of pneumoperitoneum on this single supine view of the abdomen. Extensive atherosclerosis throughout the visualized vasculature. IMPRESSION: 1. Tip of feeding tube is in the body of the stomach. Electronically Signed   By: Vinnie Langton M.D.   On: 06/15/2015 18:23          Objective: Filed Vitals:   06/21/15 1954 06/22/15 0454 06/22/15 1000 06/22/15 1717  BP: 113/68 131/44 132/63 127/52  Pulse: 62 99 86 99  Temp: 99.2 F (37.3 C) 98 F (36.7 C) 98 F (36.7 C) 97.6 F (36.4 C)  TempSrc: Oral Axillary Oral Oral  Resp: 16 14 18 18   Height:      Weight: 62 kg (136 lb 11 oz)     SpO2: 96% 98% 98% 97%    Intake/Output Summary (Last 24 hours) at 06/22/15 1734 Last data filed at 06/22/15 1717  Gross per 24 hour  Intake    431 ml  Output   1526 ml  Net  -1095 ml   Weight change: 4.6 kg (10 lb 2.3 oz) Exam:   General:  Pt is alert, follows commands appropriately, not in acute distress  HEENT: No icterus, No thrush, No neck mass, [place]/AT  Cardiovascular: IRRR, S1/S2, no rubs, no gallops  Respiratory: CTA bilaterally, no wheezing, no crackles, no rhonchi  Abdomen: Soft/+BS, non tender, non distended, no guarding  Extremities: 1+LE edema, No lymphangitis, No petechiae, No rashes, no synovitis  Data Reviewed: Basic Metabolic Panel:  Recent Labs Lab 06/16/15 0420  06/17/15 0238 06/18/15 0228 06/20/15 0449 06/21/15 0353 06/22/15 0448  NA 150*  < > 145 141 138 140 144  K 3.4*  < > 3.1* 3.1* 3.7 3.9 4.4  CL 119*  < > 112* 106 105 107 110  CO2 20*  < > 22 25 24 24 24   GLUCOSE 123*  < > 122* 136* 106* 114* 86  BUN 76*  < > 57* 44* 42* 35* 29*  CREATININE 3.44*  < > 2.32* 1.92* 1.75* 1.53* 1.33*  CALCIUM 7.9*  < > 7.5* 7.6* 7.6* 7.8* 8.0*  MG 2.1  --  1.8 1.5* 2.2  2.0  --   PHOS  --   --   --  2.5  --   --   --   < > = values in this interval not displayed. Liver Function Tests:  Recent Labs Lab 06/16/15 0420 06/17/15 0238 06/18/15 0228 06/20/15 0449  AST 61* 49*  --  56*  ALT 27 24  --  37  ALKPHOS 53 49  --  72  BILITOT 0.5 0.6  --  0.3  PROT 5.4* 5.1*  --  4.7*  ALBUMIN 2.2* 2.1* 1.9* 1.6*   No results for input(s): LIPASE, AMYLASE in the last 168 hours. No results for input(s): AMMONIA in the last 168 hours. CBC:  Recent Labs Lab 06/16/15 0420 06/17/15 0238 06/18/15 0228 06/20/15 0449 06/21/15 0353 06/22/15 0448  WBC 4.8 5.1 7.0 5.3 5.1 5.7  NEUTROABS 3.7 3.4  --   --   --   --   HGB 9.9* 10.0* 9.9* 8.3* 8.7* 8.9*  HCT 30.6* 31.1* 32.0* 27.0* 28.4* 29.6*  MCV 81.0 82.3 82.7 84.4 85.3 85.1  PLT 112* 86* 114* 152 221 274   Cardiac Enzymes: No results for input(s): CKTOTAL, CKMB, CKMBINDEX, TROPONINI in the last 168 hours. BNP: Invalid input(s): POCBNP CBG:  Recent Labs Lab 06/21/15 1712 06/21/15 1950 06/22/15 0005 06/22/15 0452 06/22/15 0755  GLUCAP 103* 110* 91 98 91    Recent Results (from the past 240 hour(s))  Urine culture     Status: None   Collection Time: 06/12/15  5:59 PM  Result Value Ref Range Status   Specimen Description URINE, CATHETERIZED  Final   Special Requests NONE  Final   Culture >=100,000 COLONIES/mL PROTEUS MIRABILIS  Final   Report Status 06/14/2015 FINAL  Final   Organism ID, Bacteria PROTEUS MIRABILIS  Final      Susceptibility   Proteus mirabilis - MIC*    AMPICILLIN <=2 SENSITIVE Sensitive     CEFAZOLIN <=4 SENSITIVE Sensitive     CEFTRIAXONE <=1 SENSITIVE Sensitive     CIPROFLOXACIN <=0.25 SENSITIVE Sensitive     GENTAMICIN <=1 SENSITIVE Sensitive     IMIPENEM <=0.25 SENSITIVE Sensitive     NITROFURANTOIN 256 RESISTANT Resistant     TRIMETH/SULFA <=20 SENSITIVE Sensitive     AMPICILLIN/SULBACTAM <=2 SENSITIVE Sensitive     * >=100,000 COLONIES/mL PROTEUS MIRABILIS  MRSA  PCR Screening     Status: None   Collection Time: 06/12/15  7:16 PM  Result Value Ref Range Status   MRSA by PCR NEGATIVE NEGATIVE Final    Comment:        The GeneXpert MRSA Assay (FDA approved for NASAL specimens only), is one component of a comprehensive MRSA colonization surveillance program. It is not intended to diagnose MRSA infection nor to guide or monitor treatment for MRSA infections.   Gastrointestinal Panel  by PCR , Stool     Status: None   Collection Time: 06/13/15  3:32 PM  Result Value Ref Range Status   Campylobacter species NOT DETECTED NOT DETECTED Final   Plesimonas shigelloides NOT DETECTED NOT DETECTED Final   Salmonella species NOT DETECTED NOT DETECTED Final   Yersinia enterocolitica NOT DETECTED NOT DETECTED Final   Vibrio species NOT DETECTED NOT DETECTED Final   Vibrio cholerae NOT DETECTED NOT DETECTED Final   Enteroaggregative E coli (EAEC) NOT DETECTED NOT DETECTED Final   Enteropathogenic E coli (EPEC) NOT DETECTED NOT DETECTED Final   Enterotoxigenic E coli (ETEC) NOT DETECTED NOT DETECTED Final   Shiga like toxin producing E coli (STEC) NOT DETECTED NOT DETECTED Final   E. coli O157 NOT DETECTED NOT DETECTED Final   Shigella/Enteroinvasive E coli (EIEC) NOT DETECTED NOT DETECTED Final   Cryptosporidium NOT DETECTED NOT DETECTED Final   Cyclospora cayetanensis NOT DETECTED NOT DETECTED Final   Entamoeba histolytica NOT DETECTED NOT DETECTED Final   Giardia lamblia NOT DETECTED NOT DETECTED Final   Adenovirus F40/41 NOT DETECTED NOT DETECTED Final   Astrovirus NOT DETECTED NOT DETECTED Final   Norovirus GI/GII NOT DETECTED NOT DETECTED Final   Rotavirus A NOT DETECTED NOT DETECTED Final   Sapovirus (I, II, IV, and V) NOT DETECTED NOT DETECTED Final  C difficile quick scan w PCR reflex     Status: None   Collection Time: 06/16/15 12:44 PM  Result Value Ref Range Status   C Diff antigen NEGATIVE NEGATIVE Final   C Diff toxin NEGATIVE NEGATIVE  Final   C Diff interpretation Negative for toxigenic C. difficile  Final     Scheduled Meds: . antiseptic oral rinse  7 mL Mouth Rinse q12n4p  . chlorhexidine  15 mL Mouth Rinse BID  . heparin  5,000 Units Subcutaneous 3 times per day  . metoprolol tartrate  12.5 mg Per Tube BID   Continuous Infusions: . sodium chloride 30 mL/hr at 06/22/15 0918     Kamaile Zachow, DO  [facility] Pager [phone]  If 7PM-7AM, please contact night-coverage [email or web address] Password [email or web address] 06/22/2015, 5:34 PM   LOS: 10 days

## 2015-06-23 DIAGNOSIS — E870 Hyperosmolality and hypernatremia: Secondary | ICD-10-CM

## 2015-06-23 DIAGNOSIS — I480 Paroxysmal atrial fibrillation: Secondary | ICD-10-CM

## 2015-06-23 LAB — BASIC METABOLIC PANEL
ANION GAP: 12 (ref 5–15)
BUN: 21 mg/dL — ABNORMAL HIGH (ref 6–20)
CALCIUM: 8 mg/dL — AB (ref 8.9–10.3)
CO2: 21 mmol/L — AB (ref 22–32)
Chloride: 112 mmol/L — ABNORMAL HIGH (ref 101–111)
Creatinine, Ser: 1.21 mg/dL — ABNORMAL HIGH (ref 0.44–1.00)
GFR, EST AFRICAN AMERICAN: 44 mL/min — AB (ref >60)
GFR, EST NON AFRICAN AMERICAN: 38 mL/min — AB (ref >60)
Glucose, Bld: 81 mg/dL (ref 65–99)
Potassium: 4.7 mmol/L (ref 3.5–5.1)
Sodium: 145 mmol/L (ref 135–145)

## 2015-06-23 MED ORDER — SODIUM CHLORIDE 0.9 % IV BOLUS (SEPSIS)
500.0000 mL | Freq: Once | INTRAVENOUS | Status: AC
  Administered 2015-06-23: 500 mL via INTRAVENOUS

## 2015-06-23 MED ORDER — METOPROLOL TARTRATE 1 MG/ML IV SOLN
INTRAVENOUS | Status: AC
  Filled 2015-06-23: qty 5

## 2015-06-23 MED ORDER — METOPROLOL TARTRATE 25 MG/10 ML ORAL SUSPENSION
25.0000 mg | Freq: Four times a day (QID) | ORAL | Status: DC
  Administered 2015-06-23 – 2015-06-24 (×4): 25 mg via ORAL
  Filled 2015-06-23 (×7): qty 10

## 2015-06-23 MED ORDER — METOPROLOL TARTRATE 1 MG/ML IV SOLN
5.0000 mg | Freq: Once | INTRAVENOUS | Status: AC
  Administered 2015-06-23: 5 mg via INTRAVENOUS

## 2015-06-23 NOTE — Progress Notes (Signed)
Patient with HR 140's on telemetry. Dr. Carles Collet aware. New orders received. After receiving 5 mg IV metoprolol, patient's HR high 90's. Dr. Carles Collet informed. Will continue to monitor.  Joellen Jersey, RN.

## 2015-06-23 NOTE — Progress Notes (Signed)
PROGRESS NOTE  PAITON KOESTLER [id number] DOB: 11/18/1920 DOA: 06/12/2015 PCP: Vic Blackbird, MD  Admit HPI / Brief Narrative: 82 year old F Hx CKD stage I, Fe deficiency Anemia, HTN, HLD, Diverticulosis of colon, and HSV who lives under the care of her nephew who presented to the ED on 06/12/2015 complaining of abdominal pain. Her nephew provided the history as the patient was encephalopathic. He stated that one day prior to admission she was in her usual health feeling well and they ate out the night before. Overnight she had no significant difficulty but early in the morning on 06/12/2015 she started having nausea, vomiting, diarrhea, and cramping abdominal pain.   In the emergency department she was found to be tachycardic and hypotensive w/ a lactic acid greater than 10, and in acute renal failure. She was given IV fluids, antibiotics, a CT scan of her abdomen was performed and pulmonary and critical care medicine was consulted for further evaluation.   HPI/Subjective: Pt awake and alert.  Denies HA, cp, sob, abdominal pain, vomiting.  Pt noted to be tachycardic during my exam. I obtained vitals at 1720--98.7-HR142--RR16--141/73--99% RA Assessment/Plan:  Severe Sepsis due to Proteus UTI  Has completed 7 days of abx tx - stop abx today and follow clinically  -Ceftriaxone last dose 06/18/2015 -Monitor off antibiotics -BMP and CBC in the morning -Low-grade temp in the morning on 06/19/2015--none since then  Dysphagia/failure to thrive Tolerating current clear liquid diet but cognitive deficit has thus far prevented safe advancement  -for much of hospitalization with PANDA tube tolerating enteral feeds -asked speech therapy for repeat eval now that pt mental status is likely near her baseline -appreciate speech therapy repeat eval-->dysphagia 1 with thin liquids -06/21/15--d/c PANDA tube and start dysphagia 1 diet -monitor po intake next 48 hours-->if adequate they  d/c to SNF ; If unable or unwilling to eat to sustain herself-->need to address comfort care with guardian -ask ancillary staff to assist with meals-->still not taking much po -consult nutrition to recommend supplement -Repeat BMP  Atrial fibrillation w/ RVR -06/23/15--redeveloped afib with RVR with HR in 140s -IV lopressor x 1 -increase po lopressor to 25 po q 6 hrs -500cc NS bolus  Anion gap acute metabolic acidosis - resolved -likely 2/2 starvation ketoacidosis + lactate + diarrhea + AKI - resolved   Hypertension  -BP currently controlled   Acute renal failure  -secondary to hypovolemia + ACE + pyelo - crt has improved but has not yet normalized - recheck in AM  -Baseline creatinine 0.8-1.0  Diarrhea -c.diff and stool pathogen panel neg -3-4 loose stool on 06/20/15 -PRN loperamide -may have been related to enteral feeding -seems to have improved  Hyperkalemia > Hypokalemia  -Kayexalate treatment > now requiring suplementation - recheck in AM   Hypernatremia -corrected w/ free water   Hypomagnesemia -replace and follow  Toxic metabolic encephalopathy -Minimize sedating medications - mental status w/o signif change over last 96hrs - suspect this is her new baseline - ?cognitive status at baseline   Goals of care -patient has a state appointed guardian who makes medical decisions on her behalf - her family has no legal authority to make medical decisions  -Palliative Care working to establish [place] w/ her guardian  -06/21/15--case discussed with Dr. Domingo Cocking  Code Status: DNR Family Communication: no family present at time of exam Disposition Plan: SNF v/s hospice care -  Consultants: Nephrology  Palliative Care  PCCM  Antibiotics: 12/28  Zosyn > 12/30  12/30 Ceftriaxone > 1/03  DVT prophylaxis: SQ heparin    Procedures/Studies: Ct Abdomen Pelvis Wo Contrast  06/12/2015  CLINICAL DATA:  Abdominal pain, diarrhea, elevated lactate greater than 10,  history diverticulosis of the colon, GERD, hypertension EXAM: CT ABDOMEN AND PELVIS WITHOUT CONTRAST TECHNIQUE: Multidetector CT imaging of the abdomen and pelvis was performed following the standard protocol without IV contrast. Sagittal and coronal MPR images reconstructed from axial data set. Oral contrast was not administered. COMPARISON:  04/16/2010 FINDINGS: Emphysematous changes at lung bases with minimal bibasilar atelectasis. Eventration RIGHT diaphragm containing liver and RIGHT kidney again noted. Extensive atherosclerotic calcifications aorta and coronary arteries. Large cavernous hemangioma seen at the RIGHT lobe of liver on the previous study is not well visualized on the current exam; hepatic contour appears concave at this site, question volume loss/contraction of this lesion since previous study, potentially measuring 2.0 x 1.8 cm image 18. Low-attenuation lesion centrally within spleen, slightly bilobed appearance, 2.8 x 1.7 cm image 7, not seen on prior noncontrast study but on earlier CT with contrast from 11/30/2008, multiple splenic hemangiomas were identified, 2 of which were located at this position now appear confluent. Gallbladder distended. LEFT renal cyst 1.6 x 1.6 cm image 14. Remainder of liver, spleen, pancreas, kidneys, and LEFT adrenal gland normal. Superior pole of the RIGHT kidney, superior aspect of liver and RIGHT adrenal gland are within the eventration and above imaged field. Hiatal hernia, moderate-sized. Enlargement of cardiac chambers. Increased stool and gas within rectum. Suboptimal assessment of GI tract due to lack of IV and oral contrast and inadequate distention. Suspicion of rectal wall thickening question proctitis. Bowel loops otherwise grossly unremarkable within limitations of exam. No definite free intraperitoneal air or fluid. Intra-abdominal fat and mesenteric fat planes are less distinct than on the previous exam though uncertain if this is due to  hypoproteinemia or mild scattered edema Question RIGHT ovarian cyst 2.4 x 3.0 cm image 43. Bladder decompressed. Severe osseous demineralization with advanced degenerative changes of the LEFT hip joint with acetabulum protrusio. Marked compression deformity of L3 vertebral body. IMPRESSION: Interval partial regression of hepatic hemangioma at RIGHT lobe liver. Slight increase in size of previously identified splenic hemangiomas. Question rectal wall thickening/proctitis, recommend correlation with proctoscopy. Extensive atherosclerotic disease. Question RIGHT ovarian cyst. No other definite intra-abdominal or intrapelvic abnormalities identified on exam limited by a lack of IV and oral contrast. Electronically Signed   By: Lavonia Dana M.D.   On: 06/12/2015 17:05   US Renal Port  06/14/2015  CLINICAL DATA:  82 year old female with history of acute renal failure. EXAM: RENAL / URINARY TRACT ULTRASOUND COMPLETE COMPARISON:  No priors. FINDINGS: Right Kidney: Length: 7.8 cm. Diffusely increased echogenicity, with areas of mild multifocal cortical thinning. No mass or hydronephrosis visualized. Left Kidney: Length: 9.7 cm. Diffusely increased echogenicity, with areas of mild multifocal cortical thinning. Tiny lesion in the lower pole measuring 1.1 x 1.1 x 1.2 cm which is hypoechoic with increased through transmission, compatible with a small cyst. No hydronephrosis visualized. Bladder: Appears normal for degree of bladder distention. IMPRESSION: 1. No hydronephrosis or other findings to account for the patient's acute renal failure. 2. Mild diffuse increased cortical echogenicity suggesting medical renal disease. Multifocal mild cortical scarring. 3. Small cysts in the lower pole of the left kidney incidentally noted. Electronically Signed   By: Vinnie Langton M.D.   On: 06/14/2015 22:24   Dg Chest Port 1 View  06/14/2015  CLINICAL DATA:  Aspiration pneumonia. EXAM: PORTABLE CHEST 1 VIEW COMPARISON:  06/12/2015  and chest CT dated 07/12/2009 FINDINGS: The patient has a new moderate left pleural effusion. There is a chronic posterior right diaphragmatic hernia containing a portion of the liver and the right kidney and the right adrenal gland. Heart size is normal. There is tortuosity and calcification of the thoracic aorta. Soft tissue density at the right apex medially represents tortuous brachiocephalic vessels. No acute osseous abnormality. Degenerative changes at the shoulders and at the thoracolumbar junction of the spine. IMPRESSION: New moderate left effusion.  Aortic atherosclerosis. Electronically Signed   By: Lorriane Shire M.D.   On: 06/14/2015 14:36   Dg Chest Portable 1 View  06/12/2015  CLINICAL DATA:  Altered mental status. Abdominal pain this morning with vomiting and diarrhea. EXAM: PORTABLE CHEST 1 VIEW COMPARISON:  Chest x-ray dated 12/20/2013 FINDINGS: Heart size and pulmonary vascularity are normal. Extensive calcification in the thoracic aorta. The lungs are clear. Diffuse osteopenia. No acute osseous abnormality. IMPRESSION: No acute abnormalities.  Aortic atherosclerosis. Electronically Signed   By: Lorriane Shire M.D.   On: 06/12/2015 16:32   Dg Abd Portable 1v  06/15/2015  CLINICAL DATA:  NG/feeding tube placement. EXAM: PORTABLE ABDOMEN - 1 VIEW COMPARISON:  None. FINDINGS: A NG/feeding tube is identified with tip overlying the proximal -mid stomach. The bowel gas pattern is unchanged. IMPRESSION: NG/feeding tube with tip overlying the proximal -mid stomach. Electronically Signed   By: Margarette Canada M.D.   On: 06/15/2015 19:46   Dg Abd Portable 1v  06/15/2015  CLINICAL DATA:  82 year old female with history of feeding tube placement. EXAM: PORTABLE ABDOMEN - 1 VIEW COMPARISON:  Abdominal radiograph 03/27/2011. FINDINGS: Feeding tube noted with tip projecting over the body of the stomach. Gas and stool are noted throughout the colon extending to the distal rectum. Several nondilated  gas-filled loops of small bowel are noted. No pathologic dilatation of small bowel. No gross evidence of pneumoperitoneum on this single supine view of the abdomen. Extensive atherosclerosis throughout the visualized vasculature. IMPRESSION: 1. Tip of feeding tube is in the body of the stomach. Electronically Signed   By: Vinnie Langton M.D.   On: 06/15/2015 18:23          Objective: Filed Vitals:   06/22/15 1937 06/23/15 0353 06/23/15 0932 06/23/15 1559  BP: 142/66 122/64 150/88 113/66  Pulse: 113 122 95 85  Temp: 99.6 F (37.6 C) 98.9 F (37.2 C) 98.2 F (36.8 C) 99.6 F (37.6 C)  TempSrc: Oral Oral Oral Oral  Resp: 16 14 16 18   Height:      Weight: 61.8 kg (136 lb 3.9 oz)     SpO2: 98% 98% 98% 97%    Intake/Output Summary (Last 24 hours) at 06/23/15 1723 Last data filed at 06/23/15 1430  Gross per 24 hour  Intake    480 ml  Output   1125 ml  Net   -645 ml   Weight change: -0.2 kg (-7.1 oz) Exam:   General:  Pt is alert, follows commands appropriately, not in acute distress  HEENT: No icterus, No thrush, No neck mass, [place]/AT  Cardiovascular: irregular, S1/S2, no rubs, no gallops  Respiratory: CTA bilaterally, no wheezing, no crackles, no rhonchi  Abdomen: Soft/+BS, non tender, non distended, no guarding; no hepatosplenomegaly  Extremities: 1 +LE edema, No lymphangitis, No petechiae, No rashes, no synovitis; no cyanosis or clubbing  Data Reviewed: Basic Metabolic Panel:  Recent Labs Lab 06/17/15 0238 06/18/15 0228  06/20/15 0449 06/21/15 0353 06/22/15 0448 06/23/15 0425  NA 145 141 138 140 144 145  K 3.1* 3.1* 3.7 3.9 4.4 4.7  CL 112* 106 105 107 110 112*  CO2 22 25 24 24 24  21*  GLUCOSE 122* 136* 106* 114* 86 81  BUN 57* 44* 42* 35* 29* 21*  CREATININE 2.32* 1.92* 1.75* 1.53* 1.33* 1.21*  CALCIUM 7.5* 7.6* 7.6* 7.8* 8.0* 8.0*  MG 1.8 1.5* 2.2 2.0  --   --   PHOS  --  2.5  --   --   --   --    Liver Function Tests:  Recent Labs Lab  06/17/15 0238 06/18/15 0228 06/20/15 0449  AST 49*  --  56*  ALT 24  --  37  ALKPHOS 49  --  72  BILITOT 0.6  --  0.3  PROT 5.1*  --  4.7*  ALBUMIN 2.1* 1.9* 1.6*   No results for input(s): LIPASE, AMYLASE in the last 168 hours. No results for input(s): AMMONIA in the last 168 hours. CBC:  Recent Labs Lab 06/17/15 0238 06/18/15 0228 06/20/15 0449 06/21/15 0353 06/22/15 0448  WBC 5.1 7.0 5.3 5.1 5.7  NEUTROABS 3.4  --   --   --   --   HGB 10.0* 9.9* 8.3* 8.7* 8.9*  HCT 31.1* 32.0* 27.0* 28.4* 29.6*  MCV 82.3 82.7 84.4 85.3 85.1  PLT 86* 114* 152 221 274   Cardiac Enzymes: No results for input(s): CKTOTAL, CKMB, CKMBINDEX, TROPONINI in the last 168 hours. BNP: Invalid input(s): POCBNP CBG:  Recent Labs Lab 06/21/15 1712 06/21/15 1950 06/22/15 0005 06/22/15 0452 06/22/15 0755  GLUCAP 103* 110* 91 98 91    Recent Results (from the past 240 hour(s))  C difficile quick scan w PCR reflex     Status: None   Collection Time: 06/16/15 12:44 PM  Result Value Ref Range Status   C Diff antigen NEGATIVE NEGATIVE Final   C Diff toxin NEGATIVE NEGATIVE Final   C Diff interpretation Negative for toxigenic C. difficile  Final     Scheduled Meds: . antiseptic oral rinse  7 mL Mouth Rinse q12n4p  . chlorhexidine  15 mL Mouth Rinse BID  . heparin  5,000 Units Subcutaneous 3 times per day  . metoprolol  5 mg Intravenous Once  . metoprolol tartrate  12.5 mg Per Tube BID  . sodium chloride  500 mL Intravenous Once   Continuous Infusions: . sodium chloride 30 mL/hr at 06/23/15 0615     Patterson Hollenbaugh, DO  [facility] Pager [phone]  If 7PM-7AM, please contact night-coverage [email or web address] Password [email or web address] 06/23/2015, 5:23 PM   LOS: 11 days

## 2015-06-24 DIAGNOSIS — I10 Essential (primary) hypertension: Secondary | ICD-10-CM

## 2015-06-24 LAB — CBC
HCT: 27.2 % — ABNORMAL LOW (ref 36.0–46.0)
Hemoglobin: 8.3 g/dL — ABNORMAL LOW (ref 12.0–15.0)
MCH: 26.3 pg (ref 26.0–34.0)
MCHC: 30.5 g/dL (ref 30.0–36.0)
MCV: 86.3 fL (ref 78.0–100.0)
PLATELETS: 287 10*3/uL (ref 150–400)
RBC: 3.15 MIL/uL — AB (ref 3.87–5.11)
RDW: 17.1 % — AB (ref 11.5–15.5)
WBC: 6.3 10*3/uL (ref 4.0–10.5)

## 2015-06-24 LAB — BASIC METABOLIC PANEL
Anion gap: 12 (ref 5–15)
BUN: 17 mg/dL (ref 6–20)
CALCIUM: 7.8 mg/dL — AB (ref 8.9–10.3)
CO2: 20 mmol/L — AB (ref 22–32)
CREATININE: 1.18 mg/dL — AB (ref 0.44–1.00)
Chloride: 116 mmol/L — ABNORMAL HIGH (ref 101–111)
GFR calc non Af Amer: 39 mL/min — ABNORMAL LOW (ref >60)
GFR, EST AFRICAN AMERICAN: 45 mL/min — AB (ref >60)
GLUCOSE: 95 mg/dL (ref 65–99)
Potassium: 4.1 mmol/L (ref 3.5–5.1)
Sodium: 148 mmol/L — ABNORMAL HIGH (ref 135–145)

## 2015-06-24 MED ORDER — ENSURE ENLIVE PO LIQD
237.0000 mL | Freq: Two times a day (BID) | ORAL | Status: DC
  Administered 2015-06-24 – 2015-06-25 (×3): 237 mL via ORAL

## 2015-06-24 MED ORDER — METOPROLOL TARTRATE 25 MG/10 ML ORAL SUSPENSION
50.0000 mg | Freq: Two times a day (BID) | ORAL | Status: DC
  Administered 2015-06-24 – 2015-06-25 (×2): 50 mg via ORAL
  Filled 2015-06-24 (×3): qty 20

## 2015-06-24 MED ORDER — SODIUM CHLORIDE 0.45 % IV SOLN
INTRAVENOUS | Status: AC
  Administered 2015-06-24: 19:00:00 via INTRAVENOUS
  Filled 2015-06-24: qty 1000

## 2015-06-24 MED ORDER — PRO-STAT SUGAR FREE PO LIQD
30.0000 mL | Freq: Every day | ORAL | Status: DC
  Administered 2015-06-24 – 2015-06-25 (×2): 30 mL via ORAL
  Filled 2015-06-24 (×2): qty 30

## 2015-06-24 NOTE — Clinical Social Work Note (Signed)
CSW talked with Cassandra-guardian with [facility] regarding patient. She continues to be in agreement with patient discharging to [facility] in [place] Call made to [facility] and talked with admissions director Annice Needy [phone] and they can accept patient when ready.    CSW talked later with Dr. Micheline Rough regarding patient disposition there are plans to have a Palliative Goals of Care Meeting. Dr. Domingo Cocking updated regarding contact with guardian and encouraged him to contact [employer] regarding this matter. His d/c plan is for patient to d/c to a Hospice facility or SNF if necessary.   Annette Liotta Givens, MSW, LCSW Licensed Clinical Social Worker [place] [phone]

## 2015-06-24 NOTE — Progress Notes (Addendum)
Daily Progress Note   Patient Name: [name] [name]       Date: 06/24/2015 DOB: [birth year]  Age: 82 y.o. MRN#: [id number] Attending Physician: Orson Eva, MD Primary Care Physician: Vic Blackbird, MD Admit Date: 06/12/2015  Reason for Consultation/Follow-up: Establishing goals of care  Subjective:  Lying in bed.  Remains confused and sleepy.  I offered her drink when I was in the room and she took one small sip and then fell back to sleep.  Interval Events:  Discussed with attending physician Dr. Carles Collet. She has had poor by mouth intake since removal of NG tube.   I called and discussed my concerns with Cassandra from [place] agency. Ms. Fullen is very frail at this point in time, and she will have rapid decompensation as she is not able to maintain adequate by mouth intake. I feel she would best be served by pursuing placement at residential hospice for end-of-life care. I discussed this with Cassandra and she is in agreement with this plan moving forward. Talked with Dr. Carles Collet as well as Lorriane Shire from social work. Will plan on making referrals to residential hospice facility for evaluation.   Length of Stay: 12 days  Current Medications: Scheduled Meds:  . antiseptic oral rinse  7 mL Mouth Rinse q12n4p  . chlorhexidine  15 mL Mouth Rinse BID  . feeding supplement (ENSURE ENLIVE)  237 mL Oral BID BM  . feeding supplement (PRO-STAT SUGAR FREE 64)  30 mL Oral Daily  . heparin  5,000 Units Subcutaneous 3 times per day  . metoprolol tartrate  50 mg Oral BID    Continuous Infusions: . sodium chloride 0.45 % 1,000 mL infusion 75 mL/hr at 06/24/15 1830    PRN Meds: acetaminophen, loperamide, ondansetron (ZOFRAN) IV  Physical Exam: Physical Exam             Weak elderly lady  sitting up in bed is not able to state her name answers a few simple yes/no type questions appropriately. Denies being in pain Shallow breathing S1-S2 Generalized edema Vital Signs: BP 138/70 mmHg  Pulse 93  Temp(Src) 98.2 F (36.8 C) (Oral)  Resp 18  Ht 5' (1.524 m)  Wt 59.421 kg (131 lb)  BMI 25.58 kg/m2  SpO2 98% SpO2: SpO2: 98 % O2 Device: O2 Device: Not Delivered O2  Flow Rate:    Intake/output summary:   Intake/Output Summary (Last 24 hours) at 06/24/15 2111 Last data filed at 06/24/15 1300  Gross per 24 hour  Intake    120 ml  Output    550 ml  Net   -430 ml   LBM: Last BM Date: 06/21/15 Baseline Weight: Weight: 58.3 kg (128 lb 8.5 oz) Most recent weight: Weight: 59.421 kg (131 lb)       Palliative Assessment/Data: Flowsheet Rows        Most Recent Value   Intake Tab    Referral Department  Hospitalist   Unit at Time of Referral  Cardiac/Telemetry Unit   Palliative Care Primary Diagnosis  Sepsis/Infectious Disease   Date Notified  06/15/15   Palliative Care Type  Return patient Palliative Care   Reason for referral  Clarify Goals of Care   Date of Admission  06/12/15   Date first seen by Palliative Care  06/17/15   # of days Palliative referral response time  2 Day(s)   # of days IP prior to Palliative referral  3   Clinical Assessment    Palliative Performance Scale Score  30%   Psychosocial & Spiritual Assessment    Palliative Care Outcomes    Palliative Care follow-up planned  Yes, Facility      Additional Data Reviewed: CBC    Component Value Date/Time   WBC 6.3 06/24/2015 0613   RBC 3.15* 06/24/2015 0613   RBC 4.30 01/07/2011 1545   HGB 8.3* 06/24/2015 0613   HCT 27.2* 06/24/2015 0613   PLT 287 06/24/2015 0613   MCV 86.3 06/24/2015 0613   MCH 26.3 06/24/2015 0613   MCHC 30.5 06/24/2015 0613   RDW 17.1* 06/24/2015 0613   LYMPHSABS 0.9 06/17/2015 0238   MONOABS 0.7 06/17/2015 0238   EOSABS 0.0 06/17/2015 0238   BASOSABS 0.1 06/17/2015  0238    CMP     Component Value Date/Time   NA 148* 06/24/2015 0613   K 4.1 06/24/2015 0613   CL 116* 06/24/2015 0613   CO2 20* 06/24/2015 0613   GLUCOSE 95 06/24/2015 0613   BUN 17 06/24/2015 0613   CREATININE 1.18* 06/24/2015 0613   CREATININE 1.02* 04/10/2015 1034   CALCIUM 7.8* 06/24/2015 0613   PROT 4.7* 06/20/2015 0449   ALBUMIN 1.6* 06/20/2015 0449   AST 56* 06/20/2015 0449   ALT 37 06/20/2015 0449   ALKPHOS 72 06/20/2015 0449   BILITOT 0.3 06/20/2015 0449   GFRNONAA 39* 06/24/2015 0613   GFRAA 45* 06/24/2015 0613       Problem List:  Patient Active Problem List   Diagnosis Date Noted  . Pressure ulcer 06/20/2015  . Aspiration of vomitus   . Essential hypertension   . Hypokalemia   . Encounter for palliative care   . Goals of care, counseling/discussion   . Sepsis, unspecified organism ([place])   . Encounter for feeding tube placement   . Acute renal failure ([place])   . Renal failure   . Acute renal failure (ARF) ([place])   . Metabolic acidosis   . Proteus mirabilis infection   . Hyperkalemia   . Hypernatremia   . AKI (acute kidney injury) ([place])   . Dehydration   . Sepsis ([place])   . Diarrhea   . Acute encephalopathy   . Atrial fibrillation ([place])   . Nausea vomiting and diarrhea 06/12/2015  . Candidiasis of skin 03/02/2014  . Corn or callus 03/02/2014  . AVN (avascular necrosis of  bone) ([place]) 01/01/2014  . Malnutrition of moderate degree ([place]) 12/21/2013  . Fall 12/20/2013  . Weakness 12/20/2013  . Recurrent UTI 12/20/2013  . A-fib ([place]) 12/20/2013  . Sinus arrhythmia 08/07/2013  . Radicular pain of left lower extremity 08/10/2012  . OA (osteoarthritis) of knee 08/10/2012  . Chronic pain 05/10/2012  . Sciatica 04/09/2011  . Gait instability 02/27/2011  . Hypertension 12/23/2010  . Anemia of chronic disease 12/23/2010  . Rheumatoid arthritis ([place]) 12/23/2010  . LIVER MASS 03/13/2010  . HIP PAIN, LEFT 08/24/2008  . CKD (chronic kidney disease), stage  II 01/20/2007  . Hyperlipidemia 07/19/2006  . DIVERTICULOSIS, COLON 07/19/2006  . CONSTIPATION NOS 07/19/2006  . OSTEOPOROSIS 07/19/2006     Palliative Care Assessment & Plan    1.Code Status:  DNR    Code Status Orders        Start     Ordered   06/12/15 1738  Do not attempt resuscitation (DNR)   Continuous    Question Answer Comment  Maintain current active treatments Yes   Do not initiate new interventions Yes      06/12/15 1737       2. Goals of Care/Additional Recommendations: Discussed case with Dr. Carles Collet. Spoke with guardian, Vito Backers about pursuing placement at residential hospice for end-of-life care   No plans for PEG or long term artificial nutrition and hydration.  She is very frail and she has demonstrated she will be unable to maintain nutrition and hydration. Based upon this, I suspect her prognosis to be less than 2 weeks.    Worthy Keeler, also expressed on 1/4 concern that her nephew has removed her from SNF in the past without guardian's consent.  Will need to continue to ensure that staff aware that nephew is not decision maker and patient is not to be allowed to leave with him.    Desire for further Chaplaincy support:no  3. Symptom Management:      1. As above  4. Palliative Prophylaxis:   Delirium Protocol  5. Prognosis: Ms. Jimmye Norman has displayed a failure to maintain adequate by mouth intake following removal of her NG tube. She has been documented to be eating less than 10 percent of her meals despite staff assistance and continues to be unable to maintain adequate hydration (less than 200 ml over the last 24 hours).  She is in a very frail state, and with continued inadequate nutrition and hydration, I believe she'll have rapid continued decline, and I would anticipate her prognosis to be less than 2 weeks.  I believe she would be best served by placement at residential hospice for end-of-life care.  Discussed this today with guardian,  Vito Backers, who is in agreement with pursuing discharge to residential hospice if she is determined to qualify.  6. Discharge Planning: Will pursue placement at residential hospice facility. Care plan was discussed with Dr Carles Collet and Vito Backers from guardianship service  Thank you for allowing the Palliative Medicine Team to assist in the care of this patient.   Time In: 1310 Time Out: 1335 Total Time 25 Prolonged Time Billed  no        [id number] Micheline Rough, MD  06/24/2015, 9:11 PM  Please contact Palliative Medicine Team phone at [phone] for questions and concerns.

## 2015-06-24 NOTE — Progress Notes (Signed)
PROGRESS NOTE  [name] [name] [id number] DOB: 10-18-1920 DOA: 06/12/2015 PCP: Vic Blackbird, MD  Admit HPI / Brief Narrative: 82 year old F Hx CKD stage I, Fe deficiency Anemia, HTN, HLD, Diverticulosis of colon, and HSV who lives under the care of her nephew who presented to the ED on 06/12/2015 complaining of abdominal pain. Her nephew provided the history as the patient was encephalopathic. He stated that one day prior to admission she was in her usual health feeling well and they ate out the night before. Overnight she had no significant difficulty but early in the morning on 06/12/2015 she started having nausea, vomiting, diarrhea, and cramping abdominal pain.   In the emergency department she was found to be tachycardic and hypotensive w/ a lactic acid greater than 10, and in acute renal failure. She was given IV fluids, antibiotics, a CT scan of her abdomen was performed and pulmonary and critical care medicine was consulted for further evaluation.   HPI/Subjective: Pt awake and alert. Denies HA, cp, sob, abdominal pain, vomiting. Pt noted to be tachycardic during my exam. I obtained vitals at 1720--98.7-HR142--RR16--141/73--99% RA Assessment/Plan:  Severe Sepsis due to Proteus UTI  Has completed 7 days of abx tx - stop abx today and follow clinically  -Ceftriaxone last dose 06/18/2015 -Monitor off antibiotics -BMP and CBC in the morning -Low-grade temp in the morning on 06/19/2015--none since then  Dysphagia/failure to thrive Tolerating current clear liquid diet but cognitive deficit has thus far prevented safe advancement  -for much of hospitalization with PANDA tube tolerating enteral feeds -asked speech therapy for repeat eval now that pt mental status is likely near her baseline -appreciate speech therapy repeat eval-->dysphagia 1 with thin liquids -06/21/15--d/c PANDA tube and start dysphagia 1 diet -monitor po intake next 48 hours-->if adequate they  d/c to SNF ; If unable or unwilling to eat to sustain herself-->need to address comfort care with guardian -ask ancillary staff to assist with meals-->still not taking much po -consult nutrition to recommend supplement -OVER PAST 2-3 DAYS--PT IS EATING 10% OF MEALS; SHE IS FULL ASSIST IN FEEDING;  SHE RARELY EATS MORE THAN 10%--THEREFORE, PT WILL BE HIGH RISK FOR DEHYDRATION AND RETURN TO HOSPITAL AFTER D/C -DISCUSSED WITH DR. Viona Gilmore BEST SERVED TO GO TO RESIDENTIAL HOSPICE IF FEASIBLE.  IF NOT-->SNF  Atrial fibrillation w/ RVR -06/23/15--redeveloped afib with RVR with HR in 140s -IV lopressor x 1 -change metoprolol tartrate 50 mg twice a day-->HR improved  Anion gap acute metabolic acidosis - resolved -likely 2/2 starvation ketoacidosis + lactate + diarrhea + AKI - resolved   Hypertension  -BP currently controlled   Acute renal failure  -secondary to hypovolemia + ACE + pyelo - crt has improved but has not yet normalized - recheck in AM  -Baseline creatinine 0.8-1.0  Diarrhea -c.diff and stool pathogen panel neg -3-4 loose stool on 06/20/15 -PRN loperamide -may have been related to enteral feeding -seems to have improved  Hyperkalemia > Hypokalemia  -Kayexalate treatment > now requiring suplementation - recheck in AM   Hypernatremia -corrected w/ free water -restart hypotonic fluid   Hypomagnesemia -replace and follow  Toxic metabolic encephalopathy -Minimize sedating medications - mental status w/o signif change over last 96hrs - suspect this is her new baseline - ?cognitive status at baseline   Goals of care -patient has a state appointed guardian who makes medical decisions on her behalf - her family has no legal authority to make medical decisions  -  Palliative Care working to establish [place] w/ her guardian  -06/21/15--case discussed with Dr. Domingo Cocking  Code Status: DNR Family Communication: no family present at time of exam Disposition Plan: SNF v/s hospice care  -  Consultants: Nephrology  Palliative Care  PCCM  Antibiotics: 12/28 Zosyn > 12/30  12/30 Ceftriaxone > 1/03  DVT prophylaxis: SQ heparin   Procedures/Studies: Ct Abdomen Pelvis Wo Contrast  06/12/2015  CLINICAL DATA:  Abdominal pain, diarrhea, elevated lactate greater than 10, history diverticulosis of the colon, GERD, hypertension EXAM: CT ABDOMEN AND PELVIS WITHOUT CONTRAST TECHNIQUE: Multidetector CT imaging of the abdomen and pelvis was performed following the standard protocol without IV contrast. Sagittal and coronal MPR images reconstructed from axial data set. Oral contrast was not administered. COMPARISON:  04/16/2010 FINDINGS: Emphysematous changes at lung bases with minimal bibasilar atelectasis. Eventration RIGHT diaphragm containing liver and RIGHT kidney again noted. Extensive atherosclerotic calcifications aorta and coronary arteries. Large cavernous hemangioma seen at the RIGHT lobe of liver on the previous study is not well visualized on the current exam; hepatic contour appears concave at this site, question volume loss/contraction of this lesion since previous study, potentially measuring 2.0 x 1.8 cm image 18. Low-attenuation lesion centrally within spleen, slightly bilobed appearance, 2.8 x 1.7 cm image 7, not seen on prior noncontrast study but on earlier CT with contrast from 11/30/2008, multiple splenic hemangiomas were identified, 2 of which were located at this position now appear confluent. Gallbladder distended. LEFT renal cyst 1.6 x 1.6 cm image 14. Remainder of liver, spleen, pancreas, kidneys, and LEFT adrenal gland normal. Superior pole of the RIGHT kidney, superior aspect of liver and RIGHT adrenal gland are within the eventration and above imaged field. Hiatal hernia, moderate-sized. Enlargement of cardiac chambers. Increased stool and gas within rectum. Suboptimal assessment of GI tract due to lack of IV and oral contrast and inadequate distention. Suspicion  of rectal wall thickening question proctitis. Bowel loops otherwise grossly unremarkable within limitations of exam. No definite free intraperitoneal air or fluid. Intra-abdominal fat and mesenteric fat planes are less distinct than on the previous exam though uncertain if this is due to hypoproteinemia or mild scattered edema Question RIGHT ovarian cyst 2.4 x 3.0 cm image 43. Bladder decompressed. Severe osseous demineralization with advanced degenerative changes of the LEFT hip joint with acetabulum protrusio. Marked compression deformity of L3 vertebral body. IMPRESSION: Interval partial regression of hepatic hemangioma at RIGHT lobe liver. Slight increase in size of previously identified splenic hemangiomas. Question rectal wall thickening/proctitis, recommend correlation with proctoscopy. Extensive atherosclerotic disease. Question RIGHT ovarian cyst. No other definite intra-abdominal or intrapelvic abnormalities identified on exam limited by a lack of IV and oral contrast. Electronically Signed   By: Lavonia Dana M.D.   On: 06/12/2015 17:05   US Renal Port  06/14/2015  CLINICAL DATA:  82 year old female with history of acute renal failure. EXAM: RENAL / URINARY TRACT ULTRASOUND COMPLETE COMPARISON:  No priors. FINDINGS: Right Kidney: Length: 7.8 cm. Diffusely increased echogenicity, with areas of mild multifocal cortical thinning. No mass or hydronephrosis visualized. Left Kidney: Length: 9.7 cm. Diffusely increased echogenicity, with areas of mild multifocal cortical thinning. Tiny lesion in the lower pole measuring 1.1 x 1.1 x 1.2 cm which is hypoechoic with increased through transmission, compatible with a small cyst. No hydronephrosis visualized. Bladder: Appears normal for degree of bladder distention. IMPRESSION: 1. No hydronephrosis or other findings to account for the patient's acute renal failure. 2. Mild diffuse increased cortical echogenicity suggesting medical renal  disease. Multifocal mild  cortical scarring. 3. Small cysts in the lower pole of the left kidney incidentally noted. Electronically Signed   By: Vinnie Langton M.D.   On: 06/14/2015 22:24   Dg Chest Port 1 View  06/14/2015  CLINICAL DATA:  Aspiration pneumonia. EXAM: PORTABLE CHEST 1 VIEW COMPARISON:  06/12/2015 and chest CT dated 07/12/2009 FINDINGS: The patient has a new moderate left pleural effusion. There is a chronic posterior right diaphragmatic hernia containing a portion of the liver and the right kidney and the right adrenal gland. Heart size is normal. There is tortuosity and calcification of the thoracic aorta. Soft tissue density at the right apex medially represents tortuous brachiocephalic vessels. No acute osseous abnormality. Degenerative changes at the shoulders and at the thoracolumbar junction of the spine. IMPRESSION: New moderate left effusion.  Aortic atherosclerosis. Electronically Signed   By: Lorriane Shire M.D.   On: 06/14/2015 14:36   Dg Chest Portable 1 View  06/12/2015  CLINICAL DATA:  Altered mental status. Abdominal pain this morning with vomiting and diarrhea. EXAM: PORTABLE CHEST 1 VIEW COMPARISON:  Chest x-ray dated 12/20/2013 FINDINGS: Heart size and pulmonary vascularity are normal. Extensive calcification in the thoracic aorta. The lungs are clear. Diffuse osteopenia. No acute osseous abnormality. IMPRESSION: No acute abnormalities.  Aortic atherosclerosis. Electronically Signed   By: Lorriane Shire M.D.   On: 06/12/2015 16:32   Dg Abd Portable 1v  06/15/2015  CLINICAL DATA:  NG/feeding tube placement. EXAM: PORTABLE ABDOMEN - 1 VIEW COMPARISON:  None. FINDINGS: A NG/feeding tube is identified with tip overlying the proximal -mid stomach. The bowel gas pattern is unchanged. IMPRESSION: NG/feeding tube with tip overlying the proximal -mid stomach. Electronically Signed   By: Margarette Canada M.D.   On: 06/15/2015 19:46   Dg Abd Portable 1v  06/15/2015  CLINICAL DATA:  82 year old female with  history of feeding tube placement. EXAM: PORTABLE ABDOMEN - 1 VIEW COMPARISON:  Abdominal radiograph 03/27/2011. FINDINGS: Feeding tube noted with tip projecting over the body of the stomach. Gas and stool are noted throughout the colon extending to the distal rectum. Several nondilated gas-filled loops of small bowel are noted. No pathologic dilatation of small bowel. No gross evidence of pneumoperitoneum on this single supine view of the abdomen. Extensive atherosclerosis throughout the visualized vasculature. IMPRESSION: 1. Tip of feeding tube is in the body of the stomach. Electronically Signed   By: Vinnie Langton M.D.   On: 06/15/2015 18:23         Subjective:  patient is awake and alert. Denies any headache, chest pain, shortness of breath. No reports of vomiting or uncontrolled pain.  Objective: Filed Vitals:   06/24/15 0443 06/24/15 0627 06/24/15 1000 06/24/15 1657  BP: 157/59 137/79 158/70 138/70  Pulse: 109  101 93  Temp: 99.7 F (37.6 C)  98.2 F (36.8 C) 98.2 F (36.8 C)  TempSrc: Oral  Oral Oral  Resp: 19  18 18   Height:      Weight:      SpO2:   98% 98%    Intake/Output Summary (Last 24 hours) at 06/24/15 1814 Last data filed at 06/24/15 1300  Gross per 24 hour  Intake    180 ml  Output    550 ml  Net   -370 ml   Weight change: -2.379 kg (-5 lb 3.9 oz) Exam:   General:  Pt is alert, follows commands appropriately, not in acute distress  HEENT: No icterus, No thrush, No neck  mass, [place]/AT  Cardiovascular: RRR, S1/S2, no rubs, no gallops  Respiratory: CTA bilaterally, no wheezing, no crackles, no rhonchi  Abdomen: Soft/+BS, non tender, non distended, no guarding  Extremities: 1+LE edema, No lymphangitis, No petechiae, No rashes, no synovitis; no cyanosis or clubbing.   Data Reviewed: Basic Metabolic Panel:  Recent Labs Lab 06/18/15 0228 06/20/15 0449 06/21/15 0353 06/22/15 0448 06/23/15 0425 06/24/15 0613  NA 141 138 140 144 145 148*  K 3.1*  3.7 3.9 4.4 4.7 4.1  CL 106 105 107 110 112* 116*  CO2 25 24 24 24  21* 20*  GLUCOSE 136* 106* 114* 86 81 95  BUN 44* 42* 35* 29* 21* 17  CREATININE 1.92* 1.75* 1.53* 1.33* 1.21* 1.18*  CALCIUM 7.6* 7.6* 7.8* 8.0* 8.0* 7.8*  MG 1.5* 2.2 2.0  --   --   --   PHOS 2.5  --   --   --   --   --    Liver Function Tests:  Recent Labs Lab 06/18/15 0228 06/20/15 0449  AST  --  56*  ALT  --  37  ALKPHOS  --  72  BILITOT  --  0.3  PROT  --  4.7*  ALBUMIN 1.9* 1.6*   No results for input(s): LIPASE, AMYLASE in the last 168 hours. No results for input(s): AMMONIA in the last 168 hours. CBC:  Recent Labs Lab 06/18/15 0228 06/20/15 0449 06/21/15 0353 06/22/15 0448 06/24/15 0613  WBC 7.0 5.3 5.1 5.7 6.3  HGB 9.9* 8.3* 8.7* 8.9* 8.3*  HCT 32.0* 27.0* 28.4* 29.6* 27.2*  MCV 82.7 84.4 85.3 85.1 86.3  PLT 114* 152 221 274 287   Cardiac Enzymes: No results for input(s): CKTOTAL, CKMB, CKMBINDEX, TROPONINI in the last 168 hours. BNP: Invalid input(s): POCBNP CBG:  Recent Labs Lab 06/21/15 1712 06/21/15 1950 06/22/15 0005 06/22/15 0452 06/22/15 0755  GLUCAP 103* 110* 91 98 91    Recent Results (from the past 240 hour(s))  C difficile quick scan w PCR reflex     Status: None   Collection Time: 06/16/15 12:44 PM  Result Value Ref Range Status   C Diff antigen NEGATIVE NEGATIVE Final   C Diff toxin NEGATIVE NEGATIVE Final   C Diff interpretation Negative for toxigenic C. difficile  Final     Scheduled Meds: . antiseptic oral rinse  7 mL Mouth Rinse q12n4p  . chlorhexidine  15 mL Mouth Rinse BID  . feeding supplement (ENSURE ENLIVE)  237 mL Oral BID BM  . feeding supplement (PRO-STAT SUGAR FREE 64)  30 mL Oral Daily  . heparin  5,000 Units Subcutaneous 3 times per day  . metoprolol tartrate  25 mg Oral Q6H   Continuous Infusions: . sodium chloride 30 mL/hr at 06/24/15 0030     Mairlyn Tegtmeyer, DO  [facility] Pager [phone]  If 7PM-7AM, please contact  night-coverage [email or web address] Password [email or web address] 06/24/2015, 6:14 PM   LOS: 12 days

## 2015-06-24 NOTE — Care Management Note (Signed)
Case Management Note  Patient Details  Name: [name] [name] MRN: [id number] Date of Birth: 1921/05/11  Subjective/Objective:            CM following for progression and d/c planning.        Action/Plan: 06/24/2015  Plan to d/c today to SNF for ongoing care with Pall following.   Expected Discharge Date:      06/24/2015           Expected Discharge Plan:  [place]  In-House Referral:  Clinical Social Work  Discharge planning Services  NA  Post Acute Care Choice:  NA Choice offered to:  NA  DME Arranged:  N/A DME Agency:  NA  HH Arranged:  NA HH Agency:  NA  Status of Service:  Completed, signed off  Medicare Important Message Given:  Yes Date Medicare IM Given:    Medicare IM give by:    Date Additional Medicare IM Given:    Additional Medicare Important Message give by:     If discussed at [place] of Stay Meetings, dates discussed:    Additional Comments:  Adron Bene, RN 06/24/2015, 11:33 AM

## 2015-06-24 NOTE — Progress Notes (Signed)
Nutrition Follow-up  DOCUMENTATION CODES:   Not applicable  INTERVENTION:  Provide Ensure Enlive po BID, each supplement provides 350 kcal and 20 grams of protein.  Provide 30 ml Prostat po once daily, each supplement provides 100 kcal and 15 grams of protein.   Encourage adequate PO intake.   NUTRITION DIAGNOSIS:   Inadequate oral intake related to dysphagia, lethargy/confusion as evidenced by meal completion < 25%; ongoing  GOAL:   Patient will meet greater than or equal to 90% of their needs; not met  MONITOR:   PO intake, Supplement acceptance, Weight trends, Labs, I & O's, Skin  REASON FOR ASSESSMENT:   Consult Enteral/tube feeding initiation and management  ASSESSMENT:   82 yo Female with PMH significant for CKD, iron deficiency anemia and HTN who lives under the care of her nephew who presented to ED on 06/12/2015 with abdominal pain, nausea, vomiting and diarrhea.   Tube feeding discontinued 1/6. Feeding tube out. Pt is currently on a dysphagia 1 diet with thin liquids. 48 hour calorie count has been initiated 1/6, however insufficient information from meals note provided, thus unable to determine results from calorie count. Of note, intake has been inadequate as meal completion has been only 10-40%. RD to order Ensure and Prostat to aid in caloric and protein needs. During time of visit, pt reports appetite is fine. Pt was encouraged to eat her food at meals and to drink her supplements. Possible plans for discharge today to SNF with Palliative following.  Nutrition-Focused physical exam completed. Findings are no fat depletion, severe muscle depletion, and moderate edema.   Labs and medications reviewed.   Diet Order:  DIET - DYS 1 Room service appropriate?: Yes; Fluid consistency:: Thin  Skin:  Wound (see comment) (DTI on buttocks)  Last BM:  1/6  Height:   Ht Readings from Last 1 Encounters:  06/15/15 5' (1.524 m)    Weight:   Wt Readings from Last 1  Encounters:  06/24/15 131 lb (59.421 kg)    Ideal Body Weight:  45.4 kg  BMI:  Body mass index is 25.58 kg/(m^2).  Estimated Nutritional Needs:   Kcal:  1200-1400  Protein:  55-65 gm  Fluid:  >/= 1.5 L  EDUCATION NEEDS:   No education needs identified at this time  Corrin Parker, MS, RD, LDN Pager # [phone] After hours/ weekend pager # [phone]

## 2015-06-24 NOTE — Care Management Important Message (Signed)
Important Message  Patient Details  Name: [name] [name] MRN: [id number] Date of Birth: 1921/01/10   Medicare Important Message Given:  Yes    Louanne Belton 06/24/2015, 2:11 [place] Message  Patient Details  Name: [name] [name] MRN: [id number] Date of Birth: 05-21-1921   Medicare Important Message Given:  Yes    Alice Burnside G 06/24/2015, 2:10 PM

## 2015-06-25 DIAGNOSIS — J690 Pneumonitis due to inhalation of food and vomit: Secondary | ICD-10-CM | POA: Insufficient documentation

## 2015-06-25 LAB — BASIC METABOLIC PANEL
Anion gap: 7 (ref 5–15)
BUN: 16 mg/dL (ref 6–20)
CALCIUM: 7.5 mg/dL — AB (ref 8.9–10.3)
CO2: 24 mmol/L (ref 22–32)
CREATININE: 1.12 mg/dL — AB (ref 0.44–1.00)
Chloride: 117 mmol/L — ABNORMAL HIGH (ref 101–111)
GFR calc non Af Amer: 41 mL/min — ABNORMAL LOW (ref >60)
GFR, EST AFRICAN AMERICAN: 48 mL/min — AB (ref >60)
Glucose, Bld: 106 mg/dL — ABNORMAL HIGH (ref 65–99)
Potassium: 3.6 mmol/L (ref 3.5–5.1)
SODIUM: 148 mmol/L — AB (ref 135–145)

## 2015-06-25 MED ORDER — ENSURE ENLIVE PO LIQD: 237.0000 mL | Freq: Two times a day (BID) | ORAL | Status: DC

## 2015-06-25 MED ORDER — METOPROLOL TARTRATE 25 MG/10 ML ORAL SUSPENSION: 50.0000 mg | Freq: Two times a day (BID) | ORAL | Status: DC

## 2015-06-25 NOTE — Progress Notes (Signed)
Daily Progress Note   Patient Name: [name] [name]       Date: 06/25/2015 DOB: 13-Nov-1920  Age: 82 y.o. MRN#: [id number] Attending Physician: Orson Eva, MD Primary Care Physician: Vic Blackbird, MD Admit Date: 06/12/2015  Reason for Consultation/Follow-up: Establishing goals of care  Subjective:  Lying in bed.  Remains confused and sleepy.   Interval Events:  Discussed with attending physician Dr. Carles Collet. She has had poor by mouth intake since removal of NG tube.   I called and discussed my concerns with Cassandra from [place] on 06/23/14. [name] [name] is very frail at this point in time, and she will have rapid decompensation as she is not able to maintain adequate by mouth intake. I feel she would best be served by pursuing placement at residential hospice for end-of-life care. I discussed this with Cassandra and she is in agreement with this plan moving forward. Talked with Dr. Carles Collet as well as Lorriane Shire from social work. Will plan on making referrals to residential hospice facility for evaluation.   Length of Stay: 13 days  Current Medications: Scheduled Meds:  . antiseptic oral rinse  7 mL Mouth Rinse q12n4p  . chlorhexidine  15 mL Mouth Rinse BID  . feeding supplement (ENSURE ENLIVE)  237 mL Oral BID BM  . feeding supplement (PRO-STAT SUGAR FREE 64)  30 mL Oral Daily  . heparin  5,000 Units Subcutaneous 3 times per day  . metoprolol tartrate  50 mg Oral BID    Continuous Infusions:    PRN Meds: acetaminophen, loperamide, ondansetron (ZOFRAN) IV  Physical Exam: Physical Exam             Weak elderly lady sitting up in bed is not able to state her name answers a few simple yes/no type questions appropriately. Denies being in pain Shallow  breathing S1-S2 Generalized edema Vital Signs: BP 97/39 mmHg  Pulse 98  Temp(Src) 100.1 F (37.8 C) (Oral)  Resp 16  Ht 5' (1.524 m)  Wt 62.234 kg (137 lb 3.2 oz)  BMI 26.80 kg/m2  SpO2 98% SpO2: SpO2: 98 % O2 Device: O2 Device: Not Delivered O2 Flow Rate:    Intake/output summary:   Intake/Output Summary (Last 24 hours) at 06/25/15 1022 Last data filed at 06/25/15 0955  Gross per 24 hour  Intake  715 ml  Output    503 ml  Net    212 ml   LBM: Last BM Date: 06/21/15 Baseline Weight: Weight: 58.3 kg (128 lb 8.5 oz) Most recent weight: Weight: 62.234 kg (137 lb 3.2 oz)       Palliative Assessment/Data: Flowsheet Rows        Most Recent Value   Intake Tab    Referral Department  Hospitalist   Unit at Time of Referral  Cardiac/Telemetry Unit   Palliative Care Primary Diagnosis  Sepsis/Infectious Disease   Date Notified  06/15/15   Palliative Care Type  Return patient Palliative Care   Reason for referral  Clarify Goals of Care   Date of Admission  06/12/15   Date first seen by Palliative Care  06/17/15   # of days Palliative referral response time  2 Day(s)   # of days IP prior to Palliative referral  3   Clinical Assessment    Palliative Performance Scale Score  30%   Psychosocial & Spiritual Assessment    Palliative Care Outcomes    Palliative Care follow-up planned  Yes, Facility      Additional Data Reviewed: CBC    Component Value Date/Time   WBC 6.3 06/24/2015 0613   RBC 3.15* 06/24/2015 0613   RBC 4.30 01/07/2011 1545   HGB 8.3* 06/24/2015 0613   HCT 27.2* 06/24/2015 0613   PLT 287 06/24/2015 0613   MCV 86.3 06/24/2015 0613   MCH 26.3 06/24/2015 0613   MCHC 30.5 06/24/2015 0613   RDW 17.1* 06/24/2015 0613   LYMPHSABS 0.9 06/17/2015 0238   MONOABS 0.7 06/17/2015 0238   EOSABS 0.0 06/17/2015 0238   BASOSABS 0.1 06/17/2015 0238    CMP     Component Value Date/Time   NA 148* 06/25/2015 0540   K 3.6 06/25/2015 0540   CL 117* 06/25/2015 0540    CO2 24 06/25/2015 0540   GLUCOSE 106* 06/25/2015 0540   BUN 16 06/25/2015 0540   CREATININE 1.12* 06/25/2015 0540   CREATININE 1.02* 04/10/2015 1034   CALCIUM 7.5* 06/25/2015 0540   PROT 4.7* 06/20/2015 0449   ALBUMIN 1.6* 06/20/2015 0449   AST 56* 06/20/2015 0449   ALT 37 06/20/2015 0449   ALKPHOS 72 06/20/2015 0449   BILITOT 0.3 06/20/2015 0449   GFRNONAA 41* 06/25/2015 0540   GFRAA 48* 06/25/2015 0540       Problem List:  Patient Active Problem List   Diagnosis Date Noted  . Pressure ulcer 06/20/2015  . Aspiration of vomitus   . Essential hypertension   . Hypokalemia   . Encounter for palliative care   . Goals of care, counseling/discussion   . Sepsis, unspecified organism ([place])   . Encounter for feeding tube placement   . Acute renal failure ([place])   . Renal failure   . Acute renal failure (ARF) ([place])   . Metabolic acidosis   . Proteus mirabilis infection   . Hyperkalemia   . Hypernatremia   . AKI (acute kidney injury) ([place])   . Dehydration   . Sepsis ([place])   . Diarrhea   . Acute encephalopathy   . Atrial fibrillation ([place])   . Nausea vomiting and diarrhea 06/12/2015  . Candidiasis of skin 03/02/2014  . Corn or callus 03/02/2014  . AVN (avascular necrosis of bone) ([place]) 01/01/2014  . Malnutrition of moderate degree ([place]) 12/21/2013  . Fall 12/20/2013  . Weakness 12/20/2013  . Recurrent UTI 12/20/2013  . A-fib ([place]) 12/20/2013  .  Sinus arrhythmia 08/07/2013  . Radicular pain of left lower extremity 08/10/2012  . OA (osteoarthritis) of knee 08/10/2012  . Chronic pain 05/10/2012  . Sciatica 04/09/2011  . Gait instability 02/27/2011  . Hypertension 12/23/2010  . Anemia of chronic disease 12/23/2010  . Rheumatoid arthritis ([place]) 12/23/2010  . LIVER MASS 03/13/2010  . HIP PAIN, LEFT 08/24/2008  . CKD (chronic kidney disease), stage II 01/20/2007  . Hyperlipidemia 07/19/2006  . DIVERTICULOSIS, COLON 07/19/2006  . CONSTIPATION NOS 07/19/2006  .  OSTEOPOROSIS 07/19/2006     Palliative Care Assessment & Plan    1.Code Status:  DNR    Code Status Orders        Start     Ordered   06/12/15 1738  Do not attempt resuscitation (DNR)   Continuous    Question Answer Comment  Maintain current active treatments Yes   Do not initiate new interventions Yes      06/12/15 1737       2. Goals of Care/Additional Recommendations: Discussed case with Dr. Carles Collet. Spoke with guardian, Vito Backers about pursuing placement at residential hospice for end-of-life care   No plans for PEG or long term artificial nutrition and hydration.  She is very frail and she has demonstrated she will be unable to maintain nutrition and hydration. Based upon this, I suspect her prognosis to be less than 2 weeks.    Worthy Keeler, also expressed on 1/4 concern that her nephew has removed her from SNF in the past without guardian's consent.  Will need to continue to ensure that staff aware that nephew is not decision maker and patient is not to be allowed to leave with him.    Desire for further Chaplaincy support:no  3. Symptom Management:      1. As above  4. Palliative Prophylaxis:   Delirium Protocol  5. Prognosis: [name] [name] has displayed a failure to maintain adequate by mouth intake following removal of her NG tube. She has been documented to be eating less than 10 percent of her meals despite staff assistance and continues to be unable to maintain adequate hydration orally (less than 200 ml over the last 24 hours). She did receive IV fluid bolus last night due to tachycardia.  Her condition will decline quickly once she is transitioned to residential hospice and supplemental IV hydration is discontinued.  She is in a very frail state, and with continued inadequate nutrition and hydration, I believe she'll have rapid continued decline, and I would anticipate her prognosis to be less than 2 weeks.  I believe she would be best served by placement at  residential hospice for end-of-life care.  Discussed this today with guardian, Vito Backers, who is in agreement with pursuing discharge to residential hospice if she is determined to qualify.  6. Discharge Planning: Pursue placement at residential hospice facility.  I spoke with Cassandra yesterday about preference for residential hospice facility. She reports preference would be 1) [facility], followed by 2) [facility], followed by 3) Hospice home in [place].  Her choice is based on proximity to family to facilitate them visiting is easily as possible. At the same time she reports that any of these would be suitable based upon bed availability she agrees [name] [name] will receive the best care by pursuing residential hospice.  Care plan was discussed with Dr Tat, social work, and Dentist from guardianship service (on 06/23/14).  Thank you for allowing the Palliative Medicine Team to assist in the care of this patient.  Time In: 850 Time Out: 0910 Total Time 20 Prolonged Time Billed  no        [id number] Micheline Rough, MD  06/25/2015, 10:22 AM  Please contact Palliative Medicine Team phone at [phone] for questions and concerns.

## 2015-06-25 NOTE — Discharge Summary (Signed)
Physician Discharge Summary  [name] [name] [id number] DOB: Oct 29, 1920 DOA: 06/12/2015  PCP: Vic Blackbird, MD  Admit date: 06/12/2015 Discharge date: 06/25/2015  PATIENT BEING DISCHARGED TO RESIDENTIAL HOSPICE  Discharge Diagnoses:  Severe Sepsis due to Proteus UTI  Has completed 7 days of abx tx - stop abx today and follow clinically  -Ceftriaxone last dose 06/18/2015 -Monitor off antibiotics -BMP and CBC in the morning -Low-grade temp in the morning on 06/19/2015--none since then  Dysphagia/failure to thrive Tolerating current clear liquid diet but cognitive deficit has thus far prevented safe advancement  -for much of hospitalization with PANDA tube tolerating enteral feeds -asked speech therapy for repeat eval now that pt mental status is likely near her baseline -appreciate speech therapy repeat eval-->dysphagia 1 with thin liquids -06/21/15--d/c PANDA tube and start dysphagia 1 diet -monitor po intake next 48 hours-->if adequate they d/c to SNF ; If unable or unwilling to eat to sustain herself-->need to address comfort care with guardian -ask ancillary staff to assist with meals-->still not taking much po -consult nutrition to recommend supplement -OVER PAST 2-3 DAYS--PT IS EATING 10% OF MEALS; SHE IS FULL ASSIST IN FEEDING; SHE RARELY EATS MORE THAN 10%--THEREFORE, PT WILL BE HIGH RISK FOR DEHYDRATION AND RETURN TO HOSPITAL AFTER D/C -DISCUSSED WITH DR. Viona Gilmore BEST SERVED TO GO TO RESIDENTIAL HOSPICE  -In fact, since discontinuing enteral feedings, the patient's labs and clinical status suggests that she is becoming volume depleted once again.  Atrial fibrillation w/ RVR -06/23/15--redeveloped afib with RVR with HR in 140s -IV lopressor x 1 -change metoprolol tartrate 50 mg twice a day-->HR improved  Anion gap acute metabolic acidosis - resolved -likely 2/2 starvation ketoacidosis + lactate + diarrhea + AKI - resolved   Hypertension  -BP currently  controlled   Acute renal failure  -secondary to hypovolemia + ACE + pyelo - crt has improved but has not yet normalized - recheck in AM  -Baseline creatinine 0.8-1.0  Diarrhea -c.diff and stool pathogen panel neg -3-4 loose stool on 06/20/15 -PRN loperamide -may have been related to enteral feeding -seems to have improved  Hyperkalemia > Hypokalemia  -Kayexalate treatment > now requiring suplementation - recheck in AM   Hypernatremia -corrected w/ free water -restart hypotonic fluid   Hypomagnesemia -replace and follow  Toxic metabolic encephalopathy -Minimize sedating medications - mental status w/o signif change over last 96hrs - suspect this is her new baseline - ?cognitive status at baseline   Goals of care -patient has a state appointed guardian who makes medical decisions on her behalf - her family has no legal authority to make medical decisions  -Palliative Care working to establish [place] w/ her guardian  -06/21/15--case discussed with Dr. Domingo Cocking  Code Status: DNR Family Communication: no family present at time of exam Disposition Plan: SNF v/s hospice care -  Consultants: Nephrology  Palliative Care  PCCM  Antibiotics: 12/28 Zosyn > 12/30  12/30 Ceftriaxone > 1/03  Discharge Condition: STABLE  Disposition: residential hospice  Diet:dysphagia 1 with thin; comfort feeds Wt Readings from Last 3 Encounters:  06/24/15 62.234 kg (137 lb 3.2 oz)  04/10/15 54.885 kg (121 lb)  12/21/13 61.5 kg (135 lb 9.3 oz)    History of present illness:  82 year old F Hx CKD stage I, Fe deficiency Anemia, HTN, HLD, Diverticulosis of colon, and HSV who lives under the care of her nephew who presented to the ED on 06/12/2015 complaining of abdominal pain. Her nephew provided the history as the patient was encephalopathic.  He stated that one day prior to admission she was in her usual health feeling well and they ate out the night before. Overnight she had no significant  difficulty but early in the morning on 06/12/2015 she started having nausea, vomiting, diarrhea, and cramping abdominal pain.   In the emergency department she was found to be tachycardic and hypotensive w/ a lactic acid greater than 10, and in acute renal failure. She was given IV fluids, antibiotics, a CT scan of her abdomen was performed and pulmonary and critical care medicine was consulted for further evaluation.   Consultants: Palliative medicine  Discharge Exam: Filed Vitals:   06/25/15 0637 06/25/15 0953  BP: 113/57 97/39  Pulse: 103 98  Temp: 100.2 F (37.9 C) 100.1 F (37.8 C)  Resp: 18 16   Filed Vitals:   06/24/15 1657 06/24/15 2208 06/25/15 0637 06/25/15 0953  BP: 138/70 130/65 113/57 97/39  Pulse: 93 96 103 98  Temp: 98.2 F (36.8 C) 99.2 F (37.3 C) 100.2 F (37.9 C) 100.1 F (37.8 C)  TempSrc: Oral Axillary Oral Oral  Resp: 18 19 18 16   Height:      Weight:  62.234 kg (137 lb 3.2 oz)    SpO2: 98% 96% 96% 98%   General: Awake and alert, NAD, pleasant, cooperative Cardiovascular: RRR, no rub, no gallop, no S3 Respiratory: CTAB, no wheeze, no rhonchi Abdomen:soft, nontender, nondistended, positive bowel sounds Extremities: 1+LE edema, No lymphangitis, no petechiae  Discharge Instructions      Discharge Instructions    Diet - low sodium heart healthy    Complete by:  As directed      Increase activity slowly    Complete by:  As directed             Medication List    STOP taking these medications        amLODipine 5 MG tablet  Commonly known as:  NORVASC     benazepril 20 MG tablet  Commonly known as:  LOTENSIN     CALCIUM + D PO     nitrofurantoin (macrocrystal-monohydrate) 100 MG capsule  Commonly known as:  MACROBID     nystatin cream  Commonly known as:  MYCOSTATIN     pravastatin 20 MG tablet  Commonly known as:  PRAVACHOL      TAKE these medications        acetaminophen 500 MG tablet  Commonly known as:  TYLENOL  Take  500-1,000 mg by mouth every 6 (six) hours as needed.     feeding supplement (ENSURE ENLIVE) Liqd  Take 237 mLs by mouth 2 (two) times daily between meals.     metoprolol tartrate 25 mg/10 mL Susp  Commonly known as:  LOPRESSOR  Take 20 mLs (50 mg total) by mouth 2 (two) times daily.         The results of significant diagnostics from this hospitalization (including imaging, microbiology, ancillary and laboratory) are listed below for reference.    Significant Diagnostic Studies: Ct Abdomen Pelvis Wo Contrast  06/12/2015  CLINICAL DATA:  Abdominal pain, diarrhea, elevated lactate greater than 10, history diverticulosis of the colon, GERD, hypertension EXAM: CT ABDOMEN AND PELVIS WITHOUT CONTRAST TECHNIQUE: Multidetector CT imaging of the abdomen and pelvis was performed following the standard protocol without IV contrast. Sagittal and coronal MPR images reconstructed from axial data set. Oral contrast was not administered. COMPARISON:  04/16/2010 FINDINGS: Emphysematous changes at lung bases with minimal bibasilar atelectasis. Eventration RIGHT diaphragm containing liver  and RIGHT kidney again noted. Extensive atherosclerotic calcifications aorta and coronary arteries. Large cavernous hemangioma seen at the RIGHT lobe of liver on the previous study is not well visualized on the current exam; hepatic contour appears concave at this site, question volume loss/contraction of this lesion since previous study, potentially measuring 2.0 x 1.8 cm image 18. Low-attenuation lesion centrally within spleen, slightly bilobed appearance, 2.8 x 1.7 cm image 7, not seen on prior noncontrast study but on earlier CT with contrast from 11/30/2008, multiple splenic hemangiomas were identified, 2 of which were located at this position now appear confluent. Gallbladder distended. LEFT renal cyst 1.6 x 1.6 cm image 14. Remainder of liver, spleen, pancreas, kidneys, and LEFT adrenal gland normal. Superior pole of the  RIGHT kidney, superior aspect of liver and RIGHT adrenal gland are within the eventration and above imaged field. Hiatal hernia, moderate-sized. Enlargement of cardiac chambers. Increased stool and gas within rectum. Suboptimal assessment of GI tract due to lack of IV and oral contrast and inadequate distention. Suspicion of rectal wall thickening question proctitis. Bowel loops otherwise grossly unremarkable within limitations of exam. No definite free intraperitoneal air or fluid. Intra-abdominal fat and mesenteric fat planes are less distinct than on the previous exam though uncertain if this is due to hypoproteinemia or mild scattered edema Question RIGHT ovarian cyst 2.4 x 3.0 cm image 43. Bladder decompressed. Severe osseous demineralization with advanced degenerative changes of the LEFT hip joint with acetabulum protrusio. Marked compression deformity of L3 vertebral body. IMPRESSION: Interval partial regression of hepatic hemangioma at RIGHT lobe liver. Slight increase in size of previously identified splenic hemangiomas. Question rectal wall thickening/proctitis, recommend correlation with proctoscopy. Extensive atherosclerotic disease. Question RIGHT ovarian cyst. No other definite intra-abdominal or intrapelvic abnormalities identified on exam limited by a lack of IV and oral contrast. Electronically Signed   By: Lavonia Dana M.D.   On: 06/12/2015 17:05   US Renal Port  06/14/2015  CLINICAL DATA:  82 year old female with history of acute renal failure. EXAM: RENAL / URINARY TRACT ULTRASOUND COMPLETE COMPARISON:  No priors. FINDINGS: Right Kidney: Length: 7.8 cm. Diffusely increased echogenicity, with areas of mild multifocal cortical thinning. No mass or hydronephrosis visualized. Left Kidney: Length: 9.7 cm. Diffusely increased echogenicity, with areas of mild multifocal cortical thinning. Tiny lesion in the lower pole measuring 1.1 x 1.1 x 1.2 cm which is hypoechoic with increased through  transmission, compatible with a small cyst. No hydronephrosis visualized. Bladder: Appears normal for degree of bladder distention. IMPRESSION: 1. No hydronephrosis or other findings to account for the patient's acute renal failure. 2. Mild diffuse increased cortical echogenicity suggesting medical renal disease. Multifocal mild cortical scarring. 3. Small cysts in the lower pole of the left kidney incidentally noted. Electronically Signed   By: Vinnie Langton M.D.   On: 06/14/2015 22:24   Dg Chest Port 1 View  06/14/2015  CLINICAL DATA:  Aspiration pneumonia. EXAM: PORTABLE CHEST 1 VIEW COMPARISON:  06/12/2015 and chest CT dated 07/12/2009 FINDINGS: The patient has a new moderate left pleural effusion. There is a chronic posterior right diaphragmatic hernia containing a portion of the liver and the right kidney and the right adrenal gland. Heart size is normal. There is tortuosity and calcification of the thoracic aorta. Soft tissue density at the right apex medially represents tortuous brachiocephalic vessels. No acute osseous abnormality. Degenerative changes at the shoulders and at the thoracolumbar junction of the spine. IMPRESSION: New moderate left effusion.  Aortic atherosclerosis. Electronically Signed  By: Lorriane Shire M.D.   On: 06/14/2015 14:36   Dg Chest Portable 1 View  06/12/2015  CLINICAL DATA:  Altered mental status. Abdominal pain this morning with vomiting and diarrhea. EXAM: PORTABLE CHEST 1 VIEW COMPARISON:  Chest x-ray dated 12/20/2013 FINDINGS: Heart size and pulmonary vascularity are normal. Extensive calcification in the thoracic aorta. The lungs are clear. Diffuse osteopenia. No acute osseous abnormality. IMPRESSION: No acute abnormalities.  Aortic atherosclerosis. Electronically Signed   By: Lorriane Shire M.D.   On: 06/12/2015 16:32   Dg Abd Portable 1v  06/15/2015  CLINICAL DATA:  NG/feeding tube placement. EXAM: PORTABLE ABDOMEN - 1 VIEW COMPARISON:  None. FINDINGS: A  NG/feeding tube is identified with tip overlying the proximal -mid stomach. The bowel gas pattern is unchanged. IMPRESSION: NG/feeding tube with tip overlying the proximal -mid stomach. Electronically Signed   By: Margarette Canada M.D.   On: 06/15/2015 19:46   Dg Abd Portable 1v  06/15/2015  CLINICAL DATA:  82 year old female with history of feeding tube placement. EXAM: PORTABLE ABDOMEN - 1 VIEW COMPARISON:  Abdominal radiograph 03/27/2011. FINDINGS: Feeding tube noted with tip projecting over the body of the stomach. Gas and stool are noted throughout the colon extending to the distal rectum. Several nondilated gas-filled loops of small bowel are noted. No pathologic dilatation of small bowel. No gross evidence of pneumoperitoneum on this single supine view of the abdomen. Extensive atherosclerosis throughout the visualized vasculature. IMPRESSION: 1. Tip of feeding tube is in the body of the stomach. Electronically Signed   By: Vinnie Langton M.D.   On: 06/15/2015 18:23     Microbiology: Recent Results (from the past 240 hour(s))  C difficile quick scan w PCR reflex     Status: None   Collection Time: 06/16/15 12:44 PM  Result Value Ref Range Status   C Diff antigen NEGATIVE NEGATIVE Final   C Diff toxin NEGATIVE NEGATIVE Final   C Diff interpretation Negative for toxigenic C. difficile  Final     Labs: Basic Metabolic Panel:  Recent Labs Lab 06/20/15 0449 06/21/15 0353 06/22/15 0448 06/23/15 0425 06/24/15 0613 06/25/15 0540  NA 138 140 144 145 148* 148*  K 3.7 3.9 4.4 4.7 4.1 3.6  CL 105 107 110 112* 116* 117*  CO2 24 24 24  21* 20* 24  GLUCOSE 106* 114* 86 81 95 106*  BUN 42* 35* 29* 21* 17 16  CREATININE 1.75* 1.53* 1.33* 1.21* 1.18* 1.12*  CALCIUM 7.6* 7.8* 8.0* 8.0* 7.8* 7.5*  MG 2.2 2.0  --   --   --   --    Liver Function Tests:  Recent Labs Lab 06/20/15 0449  AST 56*  ALT 37  ALKPHOS 72  BILITOT 0.3  PROT 4.7*  ALBUMIN 1.6*   No results for input(s): LIPASE,  AMYLASE in the last 168 hours. No results for input(s): AMMONIA in the last 168 hours. CBC:  Recent Labs Lab 06/20/15 0449 06/21/15 0353 06/22/15 0448 06/24/15 0613  WBC 5.3 5.1 5.7 6.3  HGB 8.3* 8.7* 8.9* 8.3*  HCT 27.0* 28.4* 29.6* 27.2*  MCV 84.4 85.3 85.1 86.3  PLT 152 221 274 287   Cardiac Enzymes: No results for input(s): CKTOTAL, CKMB, CKMBINDEX, TROPONINI in the last 168 hours. BNP: Invalid input(s): POCBNP CBG:  Recent Labs Lab 06/21/15 1712 06/21/15 1950 06/22/15 0005 06/22/15 0452 06/22/15 0755  GLUCAP 103* 110* 91 98 91    Time coordinating discharge:  Greater than 30 minutes  Signed:  Jennalynn Rivard, DO [facility] Pager: [phone] 06/25/2015, 1:08 PM

## 2015-06-25 NOTE — Clinical Social Work Note (Signed)
Patient discharging today to [facility] in [place]. Discharge information transmitted to facility and patient will be transported by ambulance. Dr. Domingo Cocking talked with Vito Backers, guardian of patient on Monday and this morning and she is in agreement with Hospice and her 1st choice is [facility].  Milbert Bixler Givens, MSW, LCSW Licensed Clinical Social Worker [place] [phone]

## 2015-06-25 NOTE — Progress Notes (Signed)
Occupational Therapy Treatment Patient Details Name: [name] [name] MRN: [id number] DOB: [birth year] Today's Date: 06/25/2015    History of present illness 82 year old F Hx CKD stage I, Fe deficiency Anemia, HTN, HLD, Diverticulosis of colon, and HSV who lives under the care of her nephew who presented to the ED on 06/12/2015 complaining of abdominal pain. Her nephew provided the history as the patient was encephalopathic. He stated that one day prior to admission she was in her usual health feeling well and they ate out the night before. Overnight she had no significant difficulty but early in the morning on 06/12/2015 she started having nausea, vomiting, diarrhea, and cramping abdominal pain.    OT comments  Pt with void of bowel at EOB limiting further progress with OT session. Pt not following commands with flat affect. Pt with not awareness to family. Pt states "i dont know " as only response during session to all questions or engagement in conversation.    Follow Up Recommendations  SNF    Equipment Recommendations  Other (comment) (defer )    Recommendations for Other Services Other (comment) (palliative)    Precautions / Restrictions Precautions Precautions: Fall Restrictions Weight Bearing Restrictions: No       Mobility Bed Mobility Overal bed mobility: Needs Assistance Bed Mobility: Supine to Sit;Sit to Supine Rolling: Total assist   Supine to sit: Total assist Sit to supine: Total assist   General bed mobility comments: pt static sitting ming guard but requires (A) to initiate all activity  Transfers                 General transfer comment: not attempted due to following commands and void of bowel at eob. Tech reports large diarrhea bowel void 20 minutes prior and again at this time.     Balance Overall balance assessment: Needs assistance Sitting-balance support: Bilateral upper extremity supported;Feet supported Sitting balance-Leahy Scale:  Fair                             ADL                                         General ADL Comments: pt supine <>Sit with total (A) and with immediate void of bowel. OT calling for tech assistance. Nephew present and aiding in peri care. Pt noted to have early signs of skin break down and irriration from frequent voids in bed position. Tech and family educated on the need for frequent repositioning. pt turned onto L side with pillows this session for pressure relief      Vision                     Perception     Praxis      Cognition   Behavior During Therapy: Flat affect Overall Cognitive Status: History of cognitive impairments - at baseline                       Extremity/Trunk Assessment               Exercises     Shoulder Instructions       General Comments      Pertinent Vitals/ Pain          Home Living  Prior Functioning/Environment              Frequency Min 2X/week     Progress Toward Goals  OT Goals(current goals can now be found in the care plan section)  Progress towards OT goals: Not progressing toward goals - comment  Acute Rehab OT Goals Patient Stated Goal: unable to state OT Goal Formulation: Patient unable to participate in goal setting Time For Goal Achievement: 07/02/15 Potential to Achieve Goals: Fair ADL Goals Pt Will Perform Grooming: with mod assist;sitting Pt Will Transfer to Toilet: with max assist;stand pivot transfer;bedside commode Additional ADL Goal #1: Pt will follow one step motor commands at least 25% of time  Additional ADL Goal #2: Pt will maintain EOB sitting x 10 mins with min A in prep for ADLs.   Plan Discharge plan remains appropriate    Co-evaluation                 End of Session     Activity Tolerance Other (comment) (return to supine due to diarrhea at EOB)   Patient Left in bed;with  call bell/phone within reach;with family/visitor present   Nurse Communication Mobility status;Precautions        Time: [id number] OT Time Calculation (min): 25 min  Charges: OT General Charges $OT Visit: 1 Procedure OT Treatments $Self Care/Home Management : 23-37 mins  Parke Poisson B 06/25/2015, 2:18 PM   Jeri Modena   OTR/L Pager: [phone] Office: [phone] .

## 2015-08-04 ENCOUNTER — Observation Stay (HOSPITAL_COMMUNITY)
Admission: EM | Admit: 2015-08-04 | Discharge: 2015-08-05 | Disposition: A | Discharge status: Skilled Nursing Facility | Attending: Internal Medicine | Admitting: Internal Medicine

## 2015-08-04 ENCOUNTER — Emergency Department (HOSPITAL_COMMUNITY)

## 2015-08-04 ENCOUNTER — Encounter (HOSPITAL_COMMUNITY): Payer: Self-pay | Admitting: Emergency Medicine

## 2015-08-04 DIAGNOSIS — R52 Pain, unspecified: Secondary | ICD-10-CM

## 2015-08-04 DIAGNOSIS — E870 Hyperosmolality and hypernatremia: Secondary | ICD-10-CM | POA: Diagnosis not present

## 2015-08-04 DIAGNOSIS — N189 Chronic kidney disease, unspecified: Secondary | ICD-10-CM | POA: Diagnosis not present

## 2015-08-04 DIAGNOSIS — R627 Adult failure to thrive: Secondary | ICD-10-CM | POA: Insufficient documentation

## 2015-08-04 DIAGNOSIS — G9349 Other encephalopathy: Secondary | ICD-10-CM | POA: Insufficient documentation

## 2015-08-04 DIAGNOSIS — N179 Acute kidney failure, unspecified: Secondary | ICD-10-CM | POA: Diagnosis not present

## 2015-08-04 DIAGNOSIS — G9340 Encephalopathy, unspecified: Secondary | ICD-10-CM | POA: Diagnosis not present

## 2015-08-04 DIAGNOSIS — A419 Sepsis, unspecified organism: Secondary | ICD-10-CM | POA: Diagnosis not present

## 2015-08-04 DIAGNOSIS — E876 Hypokalemia: Secondary | ICD-10-CM | POA: Diagnosis present

## 2015-08-04 DIAGNOSIS — N289 Disorder of kidney and ureter, unspecified: Secondary | ICD-10-CM | POA: Insufficient documentation

## 2015-08-04 DIAGNOSIS — N182 Chronic kidney disease, stage 2 (mild): Secondary | ICD-10-CM | POA: Diagnosis present

## 2015-08-04 DIAGNOSIS — E860 Dehydration: Secondary | ICD-10-CM | POA: Diagnosis present

## 2015-08-04 DIAGNOSIS — Z515 Encounter for palliative care: Secondary | ICD-10-CM

## 2015-08-04 HISTORY — DX: Liver disease, unspecified: K76.9

## 2015-08-04 HISTORY — DX: Hyperkalemia: E87.5

## 2015-08-04 HISTORY — DX: Unspecified dementia, unspecified severity, without behavioral disturbance, psychotic disturbance, mood disturbance, and anxiety: F03.90

## 2015-08-04 HISTORY — DX: Anemia, unspecified: D64.9

## 2015-08-04 HISTORY — DX: Hypokalemia: E87.6

## 2015-08-04 HISTORY — DX: Unspecified atrial fibrillation: I48.91

## 2015-08-04 HISTORY — DX: Dysphagia, unspecified: R13.10

## 2015-08-04 HISTORY — DX: Sepsis, unspecified organism: A41.9

## 2015-08-04 LAB — COMPREHENSIVE METABOLIC PANEL
ALBUMIN: 2.1 g/dL — AB (ref 3.5–5.0)
ALK PHOS: 71 U/L (ref 38–126)
ALT: 18 U/L (ref 14–54)
ANION GAP: 10 (ref 5–15)
AST: 29 U/L (ref 15–41)
BUN: 66 mg/dL — ABNORMAL HIGH (ref 6–20)
CALCIUM: 7.8 mg/dL — AB (ref 8.9–10.3)
CO2: 17 mmol/L — AB (ref 22–32)
Chloride: 147 mmol/L (ref 101–111)
Creatinine, Ser: 4.07 mg/dL — ABNORMAL HIGH (ref 0.44–1.00)
GFR calc Af Amer: 10 mL/min — ABNORMAL LOW (ref >60)
GFR calc non Af Amer: 9 mL/min — ABNORMAL LOW (ref >60)
GLUCOSE: 90 mg/dL (ref 65–99)
Potassium: 2.8 mmol/L — ABNORMAL LOW (ref 3.5–5.1)
SODIUM: 174 mmol/L — AB (ref 135–145)
Total Bilirubin: 0.6 mg/dL (ref 0.3–1.2)
Total Protein: 5.4 g/dL — ABNORMAL LOW (ref 6.5–8.1)

## 2015-08-04 LAB — URINALYSIS, ROUTINE W REFLEX MICROSCOPIC
Glucose, UA: NEGATIVE mg/dL
NITRITE: NEGATIVE
Protein, ur: 30 mg/dL — AB
Specific Gravity, Urine: >1.03 — ABNORMAL HIGH (ref 1.005–1.030)
pH: 5 (ref 5.0–8.0)

## 2015-08-04 LAB — CBC WITH DIFFERENTIAL/PLATELET
BASOS PCT: 0 %
Basophils Absolute: 0 10*3/uL (ref 0.0–0.1)
Eosinophils Absolute: 0.1 10*3/uL (ref 0.0–0.7)
Eosinophils Relative: 1 %
HEMATOCRIT: 33.4 % — AB (ref 36.0–46.0)
Hemoglobin: 9.3 g/dL — ABNORMAL LOW (ref 12.0–15.0)
LYMPHS PCT: 36 %
Lymphs Abs: 2.1 10*3/uL (ref 0.7–4.0)
MCH: 25.7 pg — AB (ref 26.0–34.0)
MCHC: 27.8 g/dL — AB (ref 30.0–36.0)
MCV: 92.3 fL (ref 78.0–100.0)
MONO ABS: 0.5 10*3/uL (ref 0.1–1.0)
MONOS PCT: 8 %
NEUTROS ABS: 3.2 10*3/uL (ref 1.7–7.7)
Neutrophils Relative %: 55 %
Platelets: 75 10*3/uL — ABNORMAL LOW (ref 150–400)
RBC: 3.62 MIL/uL — ABNORMAL LOW (ref 3.87–5.11)
RDW: 19.9 % — AB (ref 11.5–15.5)
WBC: 5.9 10*3/uL (ref 4.0–10.5)

## 2015-08-04 LAB — URINE MICROSCOPIC-ADD ON

## 2015-08-04 LAB — I-STAT CG4 LACTIC ACID, ED: Lactic Acid, Venous: 3.08 mmol/L (ref 0.5–2.0)

## 2015-08-04 LAB — LIPASE, BLOOD: LIPASE: 21 U/L (ref 11–51)

## 2015-08-04 LAB — MRSA PCR SCREENING: MRSA BY PCR: NEGATIVE

## 2015-08-04 MED ORDER — DILTIAZEM HCL 100 MG IV SOLR
5.0000 mg/h | Freq: Once | INTRAVENOUS | Status: AC
  Administered 2015-08-04: 5 mg/h via INTRAVENOUS
  Filled 2015-08-04: qty 100

## 2015-08-04 MED ORDER — ONDANSETRON 4 MG PO TBDP: 4.0000 mg | ORAL_TABLET | Freq: Four times a day (QID) | ORAL | Status: DC | PRN

## 2015-08-04 MED ORDER — DIPHENHYDRAMINE HCL 50 MG/ML IJ SOLN: 12.5000 mg | INTRAMUSCULAR | Status: DC | PRN

## 2015-08-04 MED ORDER — SODIUM CHLORIDE 0.9% FLUSH: 3.0000 mL | Freq: Two times a day (BID) | INTRAVENOUS | Status: DC

## 2015-08-04 MED ORDER — ONDANSETRON HCL 4 MG/2ML IJ SOLN: 4.0000 mg | Freq: Four times a day (QID) | INTRAMUSCULAR | Status: DC | PRN

## 2015-08-04 MED ORDER — DILTIAZEM HCL 25 MG/5ML IV SOLN
10.0000 mg | Freq: Once | INTRAVENOUS | Status: AC
  Administered 2015-08-04: 10 mg via INTRAVENOUS
  Filled 2015-08-04: qty 5

## 2015-08-04 MED ORDER — MORPHINE SULFATE (CONCENTRATE) 10 MG/0.5ML PO SOLN: 5.0000 mg | ORAL | Status: DC | PRN

## 2015-08-04 MED ORDER — HALOPERIDOL LACTATE 2 MG/ML PO CONC
0.5000 mg | ORAL | Status: DC | PRN
  Filled 2015-08-04: qty 0.3

## 2015-08-04 MED ORDER — DEXTROSE 5 % IV SOLN
1.0000 g | Freq: Once | INTRAVENOUS | Status: AC
  Administered 2015-08-04: 1 g via INTRAVENOUS
  Filled 2015-08-04: qty 10

## 2015-08-04 MED ORDER — SODIUM CHLORIDE 0.9 % IV BOLUS (SEPSIS)
1000.0000 mL | Freq: Once | INTRAVENOUS | Status: AC
  Administered 2015-08-04: 1000 mL via INTRAVENOUS

## 2015-08-04 MED ORDER — HALOPERIDOL 0.5 MG PO TABS
0.5000 mg | ORAL_TABLET | ORAL | Status: DC | PRN
  Filled 2015-08-04: qty 1

## 2015-08-04 MED ORDER — SODIUM CHLORIDE 0.9 % IV BOLUS (SEPSIS)
2000.0000 mL | Freq: Once | INTRAVENOUS | Status: AC
  Administered 2015-08-04: 2000 mL via INTRAVENOUS

## 2015-08-04 MED ORDER — ONDANSETRON HCL 4 MG PO TABS: 4.0000 mg | ORAL_TABLET | Freq: Four times a day (QID) | ORAL | Status: DC | PRN

## 2015-08-04 MED ORDER — POTASSIUM CHLORIDE 10 MEQ/100ML IV SOLN
10.0000 meq | INTRAVENOUS | Status: DC
  Administered 2015-08-04: 10 meq via INTRAVENOUS
  Filled 2015-08-04 (×2): qty 100

## 2015-08-04 MED ORDER — HALOPERIDOL LACTATE 5 MG/ML IJ SOLN: 0.5000 mg | INTRAMUSCULAR | Status: DC | PRN

## 2015-08-04 NOTE — ED Notes (Signed)
Per facility, patient is DNR. Advised facility that we needed paperwork.

## 2015-08-04 NOTE — H&P (Signed)
[facility] History and Physical  AKASHIA BARBARITO [id number] DOB: 07-18-1920 DOA: 08/04/2015  Referring physician:  PCP: Vic Blackbird, MD   Chief Complaint: Acute encephalopathy  HPI: CARMAN BUWALDA is a 82 y.o. female with a past medical history of advanced dementia, failure to thrive, dysphagia, recurrent urinary tract infections, poor functional status at baseline recently hospitalized from 06/12/2015 through 06/25/2015 during which time she was treated for severe sepsis due to Proteus urinary tract infection. Patient having poor prognosis with limited life expectancy as a palliative care was consulted for facilitating goals of care. Dr Domingo Cocking documents in his note on 06/25/2015 that there were no plans for PEG tube or long-term artificial nutrition/hydration and that case at been discussed with [place], her guardian, who agreed with pursuing residential hospice. She was discharged to residential hospice on 06/25/2015. In the interim it appears that she was taken out of residential hospice and placed in a skilled nursing facility. I called her guardian Vito Backers (telephone # [phone]) clarify goals of care. We discussed Mrs. Heard's poor prognosis, presenting with severe sepsis, critical lab values, unresponsiveness. Her guardian wishes to focus her care on full comfort rather than subjecting her to invasive/burdensome interventions. She will not be placed on IV fluids or IV antibiotic therapy. She will be placed in the MedSurg floor under comfort care orders.                                                   Review of Systems:  A reliable review of systems could not be obtained as patient is septic and obtunded  Past Medical History  Diagnosis Date  . GERD (gastroesophageal reflux disease)   . Complete rupture of rotator cuff   . Pain in joint, shoulder region   . Chronic kidney disease, stage I   . Iron deficiency anemia, unspecified   . Bereavement,  uncomplicated   . Edema   . Hyperglycemia   . Unspecified constipation   . Osteoporosis, unspecified   . OA (osteoarthritis)   . Unspecified essential hypertension   . Other and unspecified hyperlipidemia   . Diverticulosis of colon (without mention of hemorrhage)   . Herpes simplex keratitis 2011  . Dysphagia   . Sepsis ([place])   . Hypokalemia   . Hyperkalemia   . Dementia   . A-fib ([place])   . Liver disease   . Anemia    Past Surgical History  Procedure Laterality Date  . Abdominal hysterectomy    . Benign cyst removal from lateral chest    . Eye surgery      cataract   Social History:  reports that she has quit smoking. She does not have any smokeless tobacco history on file. She reports that she does not drink alcohol or use illicit drugs.  No Known Allergies  Family History  Problem Relation Age of Onset  . Stroke Father   . Dementia Mother   . Dementia Sister   . Cancer Brother     throat   . Nephrolithiasis Brother     Prior to Admission medications   Medication Sig Start Date End Date Taking? Authorizing Provider  acetaminophen (TYLENOL) 325 MG tablet Take 650 mg by mouth every 6 (six) hours as needed for mild pain.   Yes Historical Provider, MD  amLODipine (NORVASC) 5 MG tablet Take 5  mg by mouth daily.   Yes Historical Provider, MD   Physical Exam: Filed Vitals:   08/04/15 1500 08/04/15 1530 08/04/15 1542 08/04/15 1607  BP: 105/39 130/71  120/36  Pulse:    100  Temp:   95 F (35 C)   TempSrc:   Rectal   Resp: 23 18  16   Weight:      SpO2:    100%    Wt Readings from Last 3 Encounters:  08/04/15 45.36 kg (100 lb)  06/24/15 62.234 kg (137 lb 3.2 oz)  04/10/15 54.885 kg (121 lb)    General:  Mrs. Trachtman is toxic, minimally responsive, cannot provide history. Warming blanket in place Eyes: PERRL, normal lids, irises & conjunctiva, could not evaluate extraocular movement ENT: grossly normal hearing, lips & tongue, dry oral mucosa Neck: no LAD,  masses or thyromegaly Cardiovascular: Tachycardic irregular rate and rhythm Telemetry: SR, no arrhythmias  Respiratory: Coarse respiratory sounds having by basilar crackles Abdomen: soft, ntnd Skin: no rash or induration seen on limited exam Musculoskeletal: grossly normal tone BUE/BLE Psychiatric: She is obtunded Neurologic: Cannot perform adequate neurologic examination, she is obtunded, unable to participate in neuro exam           Labs on Admission:  Basic Metabolic Panel:  Recent Labs Lab 08/04/15 1301  NA 174*  K 2.8*  CL 147*  CO2 17*  GLUCOSE 90  BUN 66*  CREATININE 4.07*  CALCIUM 7.8*   Liver Function Tests:  Recent Labs Lab 08/04/15 1301  AST 29  ALT 18  ALKPHOS 71  BILITOT 0.6  PROT 5.4*  ALBUMIN 2.1*    Recent Labs Lab 08/04/15 1301  LIPASE 21   No results for input(s): AMMONIA in the last 168 hours. CBC:  Recent Labs Lab 08/04/15 1301  WBC 5.9  NEUTROABS 3.2  HGB 9.3*  HCT 33.4*  MCV 92.3  PLT 75*   Cardiac Enzymes: No results for input(s): CKTOTAL, CKMB, CKMBINDEX, TROPONINI in the last 168 hours.  BNP (last 3 results) No results for input(s): BNP in the last 8760 hours.  ProBNP (last 3 results) No results for input(s): PROBNP in the last 8760 hours.  CBG: No results for input(s): GLUCAP in the last 168 hours.  Radiological Exams on Admission: Ct Abdomen Pelvis Wo Contrast  08/04/2015  CLINICAL DATA:  Abnormal Na. Bowel incontinence. Hx of liver disease, hysterectomy. [place] EXAM: CT ABDOMEN AND PELVIS WITHOUT CONTRAST TECHNIQUE: Multidetector CT imaging of the abdomen and pelvis was performed following the standard protocol without IV contrast. COMPARISON:  06/12/2015 FINDINGS: Lower chest: The heart is enlarged. Extensive coronary artery calcifications. Elevation of the right hemidiaphragm. Upper abdomen: Partially calcified right hepatic lobe hemangioma, partially imaged. The gallbladder is distended. No intrahepatic or  extrahepatic biliary duct dilatation. Numerous splenic lesions are again identified. The no focal abnormality identified within the right kidney. Left renal cysts are present. No hydronephrosis. Nonobstructing left renal calculus is 2-3 mm. Hemangiomata. Gastrointestinal tract: Stomach and small bowel loops are normal in appearance. Numerous colonic diverticula are present. Diffuse mesenteric edema is nonfocal. Persistent rectal wall thickening. Pelvis: Foley catheter decompresses the bladder. No free pelvic fluid. Retroperitoneum: There is dense atherosclerotic calcification of the abdominal aorta. Abdominal wall: Diffuse body wall edema. Subcutaneous calcified granulomata. Osseous structures: Remote wedge compression fracture of L3. IMPRESSION: 1. Gallbladder distension. 2. Stable appearance of splenic lesions. 3. Right hepatic lesion, stable since most recent exam. 4. Cardiomegaly and coronary artery calcifications. 5. Colonic diverticulosis. 6. Rectal wall  thickening. The question of inflammatory, infectious process. Malignancy is less likely but possible. 7. Foley catheter. 8. Diffuse body wall and mesenteric edema. Electronically Signed   By: Nolon Nations M.D.   On: 08/04/2015 15:41   Dg Chest Portable 1 View  08/04/2015  CLINICAL DATA:  Dehydration EXAM: PORTABLE CHEST 1 VIEW COMPARISON:  06/14/2015 FINDINGS: Normal heart size. Lungs under aerated and grossly clear. No pneumothorax. Osteopenia. IMPRESSION: No active disease. Electronically Signed   By: Marybelle Killings M.D.   On: 08/04/2015 13:16    EKG: Independently reviewed.   Assessment/Plan Principal Problem:   Sepsis ([place]) Active Problems:   CKD (chronic kidney disease), stage II   Acute encephalopathy   Hypernatremia   Acute renal failure (HCC)   Hypokalemia   1. Severe sepsis. Present on admission. Mrs. Booke is a pleasant 82 year old female with a history of advanced dementia, poor functional status, recent hospitalization for  severe sepsis. Sepsis on this presentation as evidenced by a temperature of 94.9, blood pressure of 85/52, heart rate of 140, acute renal failure with crowding of 4.07, lactic acid of 3.08. Suspect source of infection to be coming from urinary tract infection. Prognosis is poor. Medical goals of care discussed with her guardian over telephone conversation, spoke with [place]. She is full comfort.  2. Acute kidney injury. Patient is septic having a creatinine of 4.07, increased from 1.12 on 06/25/2015. Likely secondary to sepsis, ATN in setting of hypotension, profound dehydration. 3. Hypernatremia. Labs showing critically high sodium of 174. Likely reflecting profound dehydration 4. Acute encephalopathy. Patient presenting unresponsive likely secondary to multiple factors including severe sepsis, critical hypernatremia, acute renal failure, profound dehydration. 5. Medical goals of care. Mrs. Boullion is a 82 year old female having a history of advanced dementia, poor functional status at baseline, dysphasia/feeding problems, failure to thrive. It has been felt by previous providers that prognosis was poor and has limited life expectancy. On a recent hospitalization was transitioned to comfort and discharge to residential hospice. She was transferred to the emergency department from a nursing facility for severe sepsis. I discussed Mrs. Fleury's  medical goals of care with her guardian Cassandra over telephone conversation, explaining her very poor prognosis. She agreed with focusing Mrs. Karch's medical care on comfort and minimizing interventions that could be invasive or burdensome. Will place her on as needed morphine for comfort. Will not administer IV fluids or IV antibiotic therapy. I suspect that she may pass in the next 24-48 hours.   Code Status: DNR Family Communication: I spoke with Cassandra telephone [phone] Disposition Plan: [place]  Time spent: 70 min  Kelvin Cellar [facility] Pager [phone]

## 2015-08-04 NOTE — ED Notes (Signed)
Report attempted 

## 2015-08-04 NOTE — ED Notes (Signed)
bair hugger applied to pt. Md at bedside.

## 2015-08-04 NOTE — ED Notes (Signed)
Pt bed changed after episode of bowel incontinence.

## 2015-08-04 NOTE — ED Notes (Signed)
PCP wanted pt to have some IVF.  Labs showed abnormal Na.  [facility] unable to get a IV access.

## 2015-08-04 NOTE — ED Notes (Signed)
Dr. Roderic Palau aware of critical values, 3.08 lactic acid, 147 chloride, 174 sodium.

## 2015-08-04 NOTE — ED Notes (Signed)
Pt pulse was 100 & spo2 was 100

## 2015-08-04 NOTE — ED Provider Notes (Signed)
CSN: [id number]     Arrival date & time 08/04/15  1210 History  By signing my name below, I, Jolayne Panther, attest that this documentation has been prepared under the direction and in the presence of Milton Ferguson, MD. Electronically Signed: Jolayne Panther, Scribe. 08/04/2015. 12:30 PM.     Chief Complaint  Patient presents with  . Dehydration   The history is provided by a caregiver (complaint of dhydration ). History limited by: Level 5 Caveat. No language interpreter was used.    HPI Comments: Level 5 Caveat (Dementia)   Past Medical History  Diagnosis Date  . GERD (gastroesophageal reflux disease)   . Complete rupture of rotator cuff   . Pain in joint, shoulder region   . Chronic kidney disease, stage I   . Iron deficiency anemia, unspecified   . Bereavement, uncomplicated   . Edema   . Hyperglycemia   . Unspecified constipation   . Osteoporosis, unspecified   . OA (osteoarthritis)   . Unspecified essential hypertension   . Other and unspecified hyperlipidemia   . Diverticulosis of colon (without mention of hemorrhage)   . Herpes simplex keratitis 2011  . Dysphagia   . Sepsis ([place])   . Hypokalemia   . Hyperkalemia   . Dementia   . A-fib ([place])   . Liver disease   . Anemia    Past Surgical History  Procedure Laterality Date  . Abdominal hysterectomy    . Benign cyst removal from lateral chest    . Eye surgery      cataract   Family History  Problem Relation Age of Onset  . Stroke Father   . Dementia Mother   . Dementia Sister   . Cancer Brother     throat   . Nephrolithiasis Brother    Social History  Substance Use Topics  . Smoking status: Former Research scientist (life sciences)  . Smokeless tobacco: None  . Alcohol Use: No   OB History    No data available     Review of Systems  Unable to perform ROS: Dementia (Level 5 Caveat due to condition of pt)    Allergies  Review of patient's allergies indicates no known allergies.  Home Medications   Prior to  Admission medications   Medication Sig Start Date End Date Taking? Authorizing Provider  acetaminophen (TYLENOL) 500 MG tablet Take 500-1,000 mg by mouth every 6 (six) hours as needed.    Historical Provider, MD  feeding supplement, ENSURE ENLIVE, (ENSURE ENLIVE) LIQD Take 237 mLs by mouth 2 (two) times daily between meals. 06/25/15   Orson Eva, MD  metoprolol tartrate (LOPRESSOR) 25 mg/10 mL SUSP Take 20 mLs (50 mg total) by mouth 2 (two) times daily. 06/25/15   Orson Eva, MD   BP 85/52 mmHg  Pulse 87  Temp(Src) 94.9 F (34.9 C) (Rectal)  Resp 18  SpO2 100% Physical Exam  Constitutional: She is oriented to person, place, and time. She appears well-developed.  Awake but not oriented to anything   HENT:  Head: Normocephalic.  Dry mucous membranes  Eyes: Conjunctivae and EOM are normal. No scleral icterus.  Neck: Neck supple. No thyromegaly present.  Cardiovascular: Normal rate and regular rhythm.  Exam reveals no gallop and no friction rub.   No murmur heard. Pulmonary/Chest: No stridor. She has no wheezes. She has no rales. She exhibits no tenderness.  Abdominal: She exhibits no distension. There is tenderness. There is no rebound.  Tenderness all around abdomen  Musculoskeletal: Normal  range of motion. She exhibits no edema.  Lymphadenopathy:    She has no cervical adenopathy.  Neurological: She is oriented to person, place, and time. She exhibits normal muscle tone. Coordination normal.  Skin: No rash noted. No erythema.  Psychiatric: She has a normal mood and affect. Her behavior is normal.    ED Course  Procedures  DIAGNOSTIC STUDIES:    Oxygen Saturation is 100% on RA, normal by my interpretation.   COORDINATION OF CARE:  12:25 PM Will administer medication in the ED. Discussed treatment plan with pt at bedside and pt agreed to plan.   Labs Review Labs Reviewed - No data to display  Imaging Review No results found. I have personally reviewed and evaluated these  images and lab results as part of my medical decision-making.   EKG Interpretation None     CRITICAL CARE Performed by: Arfa Lamarca L Total critical care time: 45 minutes Critical care time was exclusive of separately billable procedures and treating other patients. Critical care was necessary to treat or prevent imminent or life-threatening deterioration. Critical care was time spent personally by me on the following activities: development of treatment plan with patient and/or surrogate as well as nursing, discussions with consultants, evaluation of patient's response to treatment, examination of patient, obtaining history from patient or surrogate, ordering and performing treatments and interventions, ordering and review of laboratory studies, ordering and review of radiographic studies, pulse oximetry and re-evaluation of patient's condition.   MDM   Final diagnoses:  None    Pt  with urosepsis and dehydration she will be admitted to medicine     Milton Ferguson, MD 08/04/15 [phone]

## 2015-08-04 NOTE — ED Notes (Signed)
Discontinued all IV drips per dr. Lawernce Keas verbal order.

## 2015-08-05 DIAGNOSIS — E870 Hyperosmolality and hypernatremia: Secondary | ICD-10-CM | POA: Diagnosis not present

## 2015-08-05 DIAGNOSIS — A419 Sepsis, unspecified organism: Secondary | ICD-10-CM | POA: Diagnosis not present

## 2015-08-05 DIAGNOSIS — N179 Acute kidney failure, unspecified: Secondary | ICD-10-CM | POA: Diagnosis not present

## 2015-08-05 DIAGNOSIS — N182 Chronic kidney disease, stage 2 (mild): Secondary | ICD-10-CM | POA: Diagnosis not present

## 2015-08-05 DIAGNOSIS — Z515 Encounter for palliative care: Secondary | ICD-10-CM

## 2015-08-05 MED ORDER — MORPHINE SULFATE 20 MG/5ML PO SOLN: 5.0000 mg | ORAL | Status: DC | PRN

## 2015-08-05 MED ORDER — MORPHINE SULFATE (PF) 2 MG/ML IV SOLN
1.0000 mg | INTRAVENOUS | Status: DC | PRN
  Administered 2015-08-05: 1 mg via INTRAVENOUS
  Filled 2015-08-05: qty 1

## 2015-08-05 MED ORDER — MORPHINE SULFATE 20 MG/5ML PO SOLN: 5.0000 mg | ORAL | Status: AC | PRN

## 2015-08-05 MED ORDER — DEXTROSE 5 % IV SOLN
1.0000 g | Freq: Every day | INTRAVENOUS | Status: DC
  Filled 2015-08-05 (×2): qty 10

## 2015-08-05 NOTE — Discharge Summary (Signed)
Physician Discharge Summary  [name] [name] [id number] DOB: [birth year] DOA: 08/04/2015  PCP: Vic Blackbird, MD  Admit date: 08/04/2015 Discharge date: 08/05/2015  Time spent: 35 minutes  Recommendations for Outpatient Follow-up:  1. [name] [name] transitioned to full comfort care. Please consult hospice services at SNF to provide end of life care.    Discharge Diagnoses:  Principal Problem:   Sepsis ([place]) Active Problems:   CKD (chronic kidney disease), stage II   Acute encephalopathy   Hypernatremia   Acute renal failure (HCC)   Hypokalemia   Discharge Condition: Stable for transport  Diet recommendation: As tolerated  Filed Weights   08/04/15 1338  Weight: 45.36 kg (100 lb)    History of present illness:  [name] [name] is a 82 y.o. female with a past medical history of advanced dementia, failure to thrive, dysphagia, recurrent urinary tract infections, poor functional status at baseline recently hospitalized from 06/12/2015 through 06/25/2015 during which time she was treated for severe sepsis due to Proteus urinary tract infection. Patient having poor prognosis with limited life expectancy as a palliative care was consulted for facilitating goals of care. Dr Domingo Cocking documents in his note on 06/25/2015 that there were no plans for PEG tube or long-term artificial nutrition/hydration and that case at been discussed with [place], her guardian, who agreed with pursuing residential hospice. She was discharged to residential hospice on 06/25/2015. In the interim it appears that she was taken out of residential hospice and placed in a skilled nursing facility. I called her guardian Vito Backers (telephone # [phone]) clarify goals of care. We discussed [name] [name]'s poor prognosis, presenting with severe sepsis, critical lab values, unresponsiveness. Her guardian wishes to focus her care on full comfort rather than subjecting her to invasive/burdensome  interventions. She will not be placed on IV fluids or IV antibiotic therapy. She will be placed in the MedSurg floor under comfort care orders.  Hospital Course:  [name] [name] is a pleasant 82 year old female with past medical history of advanced dementia, dysphasia, poor functional status at baseline, admitted to medicine service on 08/04/2015 when she presented as a transfer from skilled nursing facility. She recently was discharged to inpatient hospice on a hospitalization in January 2017. She had been evaluated by Dr. Domingo Cocking of a palliative care during that hospitalization. Plans have been made to transition to full comfort and continue receiving care at residential hospice. In the interim she was discharged to skilled nursing facility. She presented with severe sepsis evidenced by temperature of 94.9, hypotension, acute renal failure, lactic acid of 3.08. Suspected source of infection to be coming from urinary tract. I spoke with her legal guarding Cassandra over telephone conversation, explained poor prognosis. She agreed with focusing care on comfort minimizing interventions I could be invasive or burdensome. She was treated with IV fluids or IV antibiotics, as her care during this hospitalization has been focused on comfort only. Plan for her to be discharged to her skilled nursing facility with hospice services following. Please consult hospice.   Discharge Exam: Filed Vitals:   08/05/15 0830 08/05/15 0850  BP: 93/47 102/61  Pulse: 94   Temp: 98.5 F (36.9 C)   Resp: 18     General: Toxic-appearing, minimally responsive, unable to provide history or follow commands Cardiovascular: Tachycardic, irregular rate and rhythm normal S1-S2 no murmurs rubs or gallops no extremity edema. Respiratory: Coarse respiratory sounds, tachypnea Abdomen: Soft nontender Extremities: No edema  Discharge Instructions   Discharge Instructions    Call MD for:  severe uncontrolled pain    Complete by:   As directed      Call MD for:    Complete by:  As directed      Diet - low sodium heart healthy    Complete by:  As directed      Increase activity slowly    Complete by:  As directed           Current Discharge Medication List    START taking these medications   Details  morphine 20 MG/5ML solution Take 1.3 mLs (5.2 mg total) by mouth every 2 (two) hours as needed for pain. Qty: 100 mL, Refills: 0      CONTINUE these medications which have NOT CHANGED   Details  acetaminophen (TYLENOL) 325 MG tablet Take 650 mg by mouth every 6 (six) hours as needed for mild pain.      STOP taking these medications     amLODipine (NORVASC) 5 MG tablet        No Known Allergies    The results of significant diagnostics from this hospitalization (including imaging, microbiology, ancillary and laboratory) are listed below for reference.    Significant Diagnostic Studies: Ct Abdomen Pelvis Wo Contrast  08/04/2015  CLINICAL DATA:  Abnormal Na. Bowel incontinence. Hx of liver disease, hysterectomy. [facility] [place] EXAM: CT ABDOMEN AND PELVIS WITHOUT CONTRAST TECHNIQUE: Multidetector CT imaging of the abdomen and pelvis was performed following the standard protocol without IV contrast. COMPARISON:  06/12/2015 FINDINGS: Lower chest: The heart is enlarged. Extensive coronary artery calcifications. Elevation of the right hemidiaphragm. Upper abdomen: Partially calcified right hepatic lobe hemangioma, partially imaged. The gallbladder is distended. No intrahepatic or extrahepatic biliary duct dilatation. Numerous splenic lesions are again identified. The no focal abnormality identified within the right kidney. Left renal cysts are present. No hydronephrosis. Nonobstructing left renal calculus is 2-3 mm. Hemangiomata. Gastrointestinal tract: Stomach and small bowel loops are normal in appearance. Numerous colonic diverticula are present. Diffuse mesenteric edema is nonfocal. Persistent rectal wall thickening. Pelvis:  Foley catheter decompresses the bladder. No free pelvic fluid. Retroperitoneum: There is dense atherosclerotic calcification of the abdominal aorta. Abdominal wall: Diffuse body wall edema. Subcutaneous calcified granulomata. Osseous structures: Remote wedge compression fracture of L3. IMPRESSION: 1. Gallbladder distension. 2. Stable appearance of splenic lesions. 3. Right hepatic lesion, stable since most recent exam. 4. Cardiomegaly and coronary artery calcifications. 5. Colonic diverticulosis. 6. Rectal wall thickening. The question of inflammatory, infectious process. Malignancy is less likely but possible. 7. Foley catheter. 8. Diffuse body wall and mesenteric edema. Electronically Signed   By: Nolon Nations M.D.   On: 08/04/2015 15:41   Dg Chest Portable 1 View  08/04/2015  CLINICAL DATA:  Dehydration EXAM: PORTABLE CHEST 1 VIEW COMPARISON:  06/14/2015 FINDINGS: Normal heart size. Lungs under aerated and grossly clear. No pneumothorax. Osteopenia. IMPRESSION: No active disease. Electronically Signed   By: Marybelle Killings M.D.   On: 08/04/2015 13:16    Microbiology: Recent Results (from the past 240 hour(s))  Blood Culture (routine x 2)     Status: None (Preliminary result)   Collection Time: 08/04/15  1:01 PM  Result Value Ref Range Status   Specimen Description RIGHT ANTECUBITAL  Final   Special Requests BOTTLES DRAWN AEROBIC ONLY 4CC ONLY  Final   Culture PENDING  Incomplete   Report Status PENDING  Incomplete  Blood Culture (routine x 2)     Status: None (Preliminary result)   Collection Time: 08/04/15  1:12 PM  Result  Value Ref Range Status   Specimen Description BLOOD LEFT HAND  Final   Special Requests BOTTLES DRAWN AEROBIC ONLY 4CC ONLY  Final   Culture  Setup Time   Final    GRAM NEGATIVE RODS AEROBIC BOTTLE ONLY CRITICAL RESULT CALLED TO, READ BACK BY AND VERIFIED WITH: Earlie Server, RN [phone] [facility]    Culture   Final    GRAM NEGATIVE RODS Performed at [facility]    Report Status PENDING  Incomplete  MRSA PCR Screening     Status: None   Collection Time: 08/04/15  7:04 PM  Result Value Ref Range Status   MRSA by PCR NEGATIVE NEGATIVE Final    Comment:        The GeneXpert MRSA Assay (FDA approved for NASAL specimens only), is one component of a comprehensive MRSA colonization surveillance program. It is not intended to diagnose MRSA infection nor to guide or monitor treatment for MRSA infections.      Labs: Basic Metabolic Panel:  Recent Labs Lab 08/04/15 1301  NA 174*  K 2.8*  CL 147*  CO2 17*  GLUCOSE 90  BUN 66*  CREATININE 4.07*  CALCIUM 7.8*   Liver Function Tests:  Recent Labs Lab 08/04/15 1301  AST 29  ALT 18  ALKPHOS 71  BILITOT 0.6  PROT 5.4*  ALBUMIN 2.1*    Recent Labs Lab 08/04/15 1301  LIPASE 21   No results for input(s): AMMONIA in the last 168 hours. CBC:  Recent Labs Lab 08/04/15 1301  WBC 5.9  NEUTROABS 3.2  HGB 9.3*  HCT 33.4*  MCV 92.3  PLT 75*   Cardiac Enzymes: No results for input(s): CKTOTAL, CKMB, CKMBINDEX, TROPONINI in the last 168 hours. BNP: BNP (last 3 results) No results for input(s): BNP in the last 8760 hours.  ProBNP (last 3 results) No results for input(s): PROBNP in the last 8760 hours.  CBG: No results for input(s): GLUCAP in the last 168 hours.     Signed:  Kelvin Cellar MD.  [facility] 08/05/2015, 9:24 AM

## 2015-08-05 NOTE — Progress Notes (Addendum)
Patient had positive blood cultures for Gram positive Cocci,and Clusters. Dr Coralyn Pear notified. Continue with plan of care.Marland Kitchen

## 2015-08-05 NOTE — Progress Notes (Signed)
Patient admitted overnight from [facility]. OK per MD for d/c today back to facility with Hospice services. Pt transferred to: [facility] Anticipated date of transfer: 08/05/15 Transported by: Ambulance ([facility]) Time Tentatively Scheduled for: 4:00 PM Family notified: Legal Guardian- Cassandra-  [employer]- message left via [place] at agency for [place] re: d/c. Report # given to nurse to call report to SNF; DC summary sent to facility for review. Patient is alert but confused. CSW spoke with admissions at [facility] and bed is available. Patient will need Hospice care at the facility per MD; discussed with SNF and MD discussed with Legal Guardian. Comfort measures are in place.  No further CSW needs identified.  CSW signing off.  Kendell Bane, LCSW [phone]

## 2015-08-06 LAB — CULTURE, BLOOD (ROUTINE X 2)

## 2015-08-06 NOTE — Progress Notes (Signed)
Patient being discharged to [facility] called,and given to [facility] LPN. Transported by EMS to awaiting facility.Marland Kitchen

## 2015-08-07 LAB — CULTURE, BLOOD (ROUTINE X 2)

## 2015-08-07 LAB — URINE CULTURE: Culture: >=100000

## 2015-08-12 ENCOUNTER — Inpatient Hospital Stay: Payer: Medicare Other | Admitting: Family Medicine

## 2015-08-14 DEATH — deceased

## 2015-08-15 ENCOUNTER — Ambulatory Visit: Payer: Medicare Other | Admitting: Gastroenterology

## 2015-08-15 ENCOUNTER — Telehealth: Payer: Self-pay | Admitting: Gastroenterology

## 2015-08-15 ENCOUNTER — Encounter: Payer: Self-pay | Admitting: Gastroenterology

## 2015-08-15 NOTE — Telephone Encounter (Signed)
PATIENT WAS A NO SHOW AND LETTER SENT  °

## 2015-10-11 ENCOUNTER — Ambulatory Visit: Payer: Medicare Other | Admitting: Family Medicine

## 2016-10-28 IMAGING — CR DG ABD PORTABLE 1V
1 series · 1 of 1 positions shown · non-contrast
Comparison: Abdominal radiograph 03/27/2011.

CLINICAL DATA: [AGE] female with history of feeding tube
placement.

EXAM:
PORTABLE ABDOMEN - 1 VIEW

[AP]
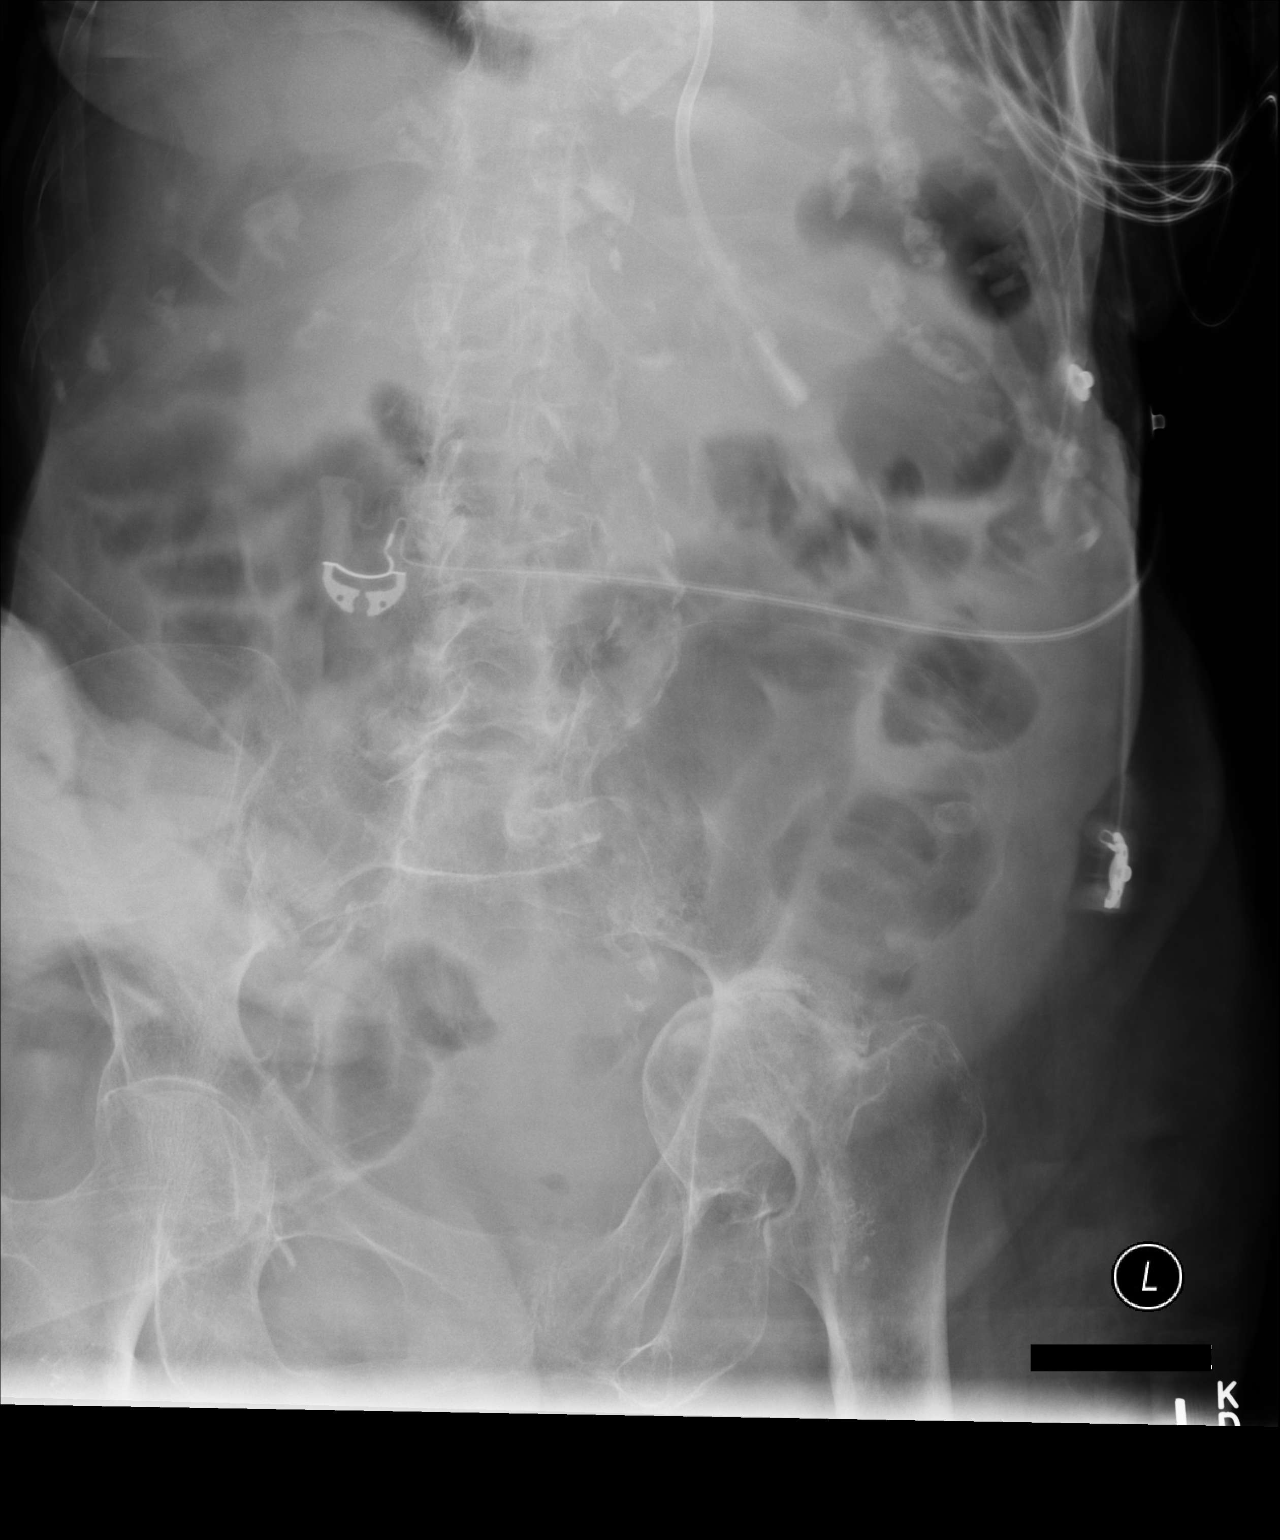

[1 of 1 positions shown; findings below may reference images not displayed]

FINDINGS: Feeding tube noted with tip projecting over the body of the stomach.
Gas and stool are noted throughout the colon extending to the distal
rectum. Several nondilated gas-filled loops of small bowel are
noted. No pathologic dilatation of small bowel. No gross evidence of
pneumoperitoneum on this single supine view of the abdomen.
Extensive atherosclerosis throughout the visualized vasculature.
IMPRESSION: 1. Tip of feeding tube is in the body of the stomach.
